# Patient Record
Sex: Female | Born: 1965 | Race: White | Hispanic: No | Smoking: Former smoker
Health system: Southern US, Community
[De-identification: ages and names within clinical notes are randomized; demographics above are authoritative.]

## PROBLEM LIST (undated history)

## (undated) DIAGNOSIS — R52 Pain, unspecified: ICD-10-CM

## (undated) DIAGNOSIS — Z789 Other specified health status: Secondary | ICD-10-CM

## (undated) DIAGNOSIS — F329 Major depressive disorder, single episode, unspecified: Secondary | ICD-10-CM

## (undated) DIAGNOSIS — F32A Depression, unspecified: Secondary | ICD-10-CM

## (undated) DIAGNOSIS — C50919 Malignant neoplasm of unspecified site of unspecified female breast: Secondary | ICD-10-CM

## (undated) DIAGNOSIS — D649 Anemia, unspecified: Secondary | ICD-10-CM

## (undated) HISTORY — DX: Major depressive disorder, single episode, unspecified: F32.9

## (undated) HISTORY — DX: Depression, unspecified: F32.A

---

## 1992-01-26 HISTORY — PX: TUBAL LIGATION: SHX77

## 1996-01-26 HISTORY — PX: ABDOMINOPLASTY: SUR9

## 1996-01-26 HISTORY — PX: BREAST REDUCTION SURGERY: SHX8

## 2011-01-26 HISTORY — PX: CYST EXCISION: SHX5701

## 2012-03-27 ENCOUNTER — Encounter: Payer: Self-pay | Admitting: Gynecology

## 2012-03-27 DIAGNOSIS — F329 Major depressive disorder, single episode, unspecified: Secondary | ICD-10-CM | POA: Insufficient documentation

## 2012-04-12 ENCOUNTER — Encounter (HOSPITAL_COMMUNITY): Payer: Self-pay | Admitting: Pharmacist

## 2012-04-14 ENCOUNTER — Institutional Professional Consult (permissible substitution): Payer: Self-pay | Admitting: Gynecology

## 2012-04-17 ENCOUNTER — Encounter (HOSPITAL_COMMUNITY): Payer: Self-pay

## 2012-04-17 ENCOUNTER — Encounter: Payer: Self-pay | Admitting: Gynecology

## 2012-04-17 ENCOUNTER — Ambulatory Visit (INDEPENDENT_AMBULATORY_CARE_PROVIDER_SITE_OTHER): Payer: Managed Care, Other (non HMO) | Admitting: Gynecology

## 2012-04-17 ENCOUNTER — Encounter (HOSPITAL_COMMUNITY)
Admission: RE | Admit: 2012-04-17 | Discharge: 2012-04-17 | Disposition: A | Discharge status: Home / Self Care | Payer: Managed Care, Other (non HMO) | Source: Ambulatory Visit | Attending: Gynecology | Admitting: Gynecology

## 2012-04-17 ENCOUNTER — Telehealth: Payer: Self-pay | Admitting: Gynecology

## 2012-04-17 VITALS — BP 102/60 | HR 64 | Resp 12

## 2012-04-17 DIAGNOSIS — N839 Noninflammatory disorder of ovary, fallopian tube and broad ligament, unspecified: Secondary | ICD-10-CM

## 2012-04-17 DIAGNOSIS — N838 Other noninflammatory disorders of ovary, fallopian tube and broad ligament: Secondary | ICD-10-CM

## 2012-04-17 HISTORY — DX: Other specified health status: Z78.9

## 2012-04-17 LAB — CBC
Hemoglobin: 13.7 g/dL (ref 12.0–15.0)
MCH: 31.3 pg (ref 26.0–34.0)
RBC: 4.38 MIL/uL (ref 3.87–5.11)
WBC: 5 10*3/uL (ref 4.0–10.5)

## 2012-04-17 LAB — SURGICAL PCR SCREEN
MRSA, PCR: NEGATIVE
Staphylococcus aureus: NEGATIVE

## 2012-04-17 MED ORDER — HYDROMORPHONE HCL 2 MG PO TABS: 2.0000 mg | ORAL_TABLET | ORAL | Status: DC | PRN

## 2012-04-17 NOTE — Patient Instructions (Addendum)
Pt reminded regarding upcoming surgery

## 2012-04-17 NOTE — Telephone Encounter (Signed)
PT WOULD LIKE SOMEONE IN BILLING TO CALL REGARDING HER SX PAYMENT.

## 2012-04-17 NOTE — Progress Notes (Signed)
Quick Note:  Pt informed at pre-op regarding labs ______

## 2012-04-17 NOTE — Progress Notes (Signed)
CHIEF COMPLAINT Left ovarian mass, suspect dermoid  HPI 46 yrsSingleCaucasian Gravity 3 Parity 2 female with a Patient's last menstrual period was 04/05/2012. presents with chief complaint of left ovarian mass with pelvic pain of 1year(s) duration.  Her symptoms are dysmenorrhea, but no dysparunia.Pt is aware that removal of the ovarian mass might not be adequate treatment for her pelvic pain/dysmenorrhea and is planning a wait and see, if need be, will place a Mirena IUD.  The patient's evaluation to date has included ultrasound which showed a posterior left ovary with a cystic mass with a solid calcification at the wall.  No free fluid.  Her medical therapy has included NSAIDS.  The patient is now requesting surgical evaluation and treatment for left dermoid.   PAST OB/GYN HISTORY Patient's last menstrual period was 04/05/2012. Menses yes; current menstrual pattern: flow is moderate and with severe dysmenorrhea Sexually active yes Method of contraception Sterilization for Men and Women History of sexually transmitted disease The patient denies history of sexually transmitted disease. Menopausal hormonal therapy no Prior gynecologic surgery yes c/s x2  Vaginal deliveries no  Cesarean sections yes 2 Miscarriages no  Voluntary interruption of pregnancy yes 1 Ectopic pregnancyno   Health Maintenance  Topic Date Due  . Influenza Vaccine  09/25/1964  . Pap Smear  09/10/1982  . Tetanus/tdap  09/10/1983    Past Medical History  Diagnosis Date  . Depression   . Medical history non-contributory     Personal history of reaction to anesthesia no, venous thromboembolism no, bleeding diathesis no, personal wish to avoid transfusion of blood products no  Past Surgical History  Procedure Laterality Date  . Abdominoplasty  1998  . Breast reduction surgery  1998  . Tubal ligation  1994  . Cesarean section      x 2  . Cyst excision  2013    from wrist    Allergies:  @RRALLERGIES @  Current Outpatient Prescriptions  Medication Sig Dispense Refill  . OVER THE COUNTER MEDICATION Take 2 tablets by mouth daily. Over the counter cleanse, pt states that it does have senna in it.       No current facility-administered medications for this visit.     reports that she has been smoking Cigarettes.  She has been smoking about 0.00 packs per day. She has never used smokeless tobacco. She reports that she does not drink alcohol or use illicit drugs.     Family History  Problem Relation Age of Onset  . Depression Mother     Family history of reaction to anesthesia  no, venous thromboembolism no, bleeding diathesis no.  ROS: Pertinent items are noted in HPI.   PHYSICAL EXAM  BP 102/60  Pulse 64  Resp 12  LMP 04/05/2012  General appearance: alert and cooperative Head: Normocephalic, without obvious abnormality, atraumatic Neck: no adenopathy, no carotid bruit, no JVD, supple, symmetrical, trachea midline and thyroid not enlarged, symmetric, no tenderness/mass/nodules Lungs: clear to auscultation bilaterally Heart: regular rate and rhythm, S1, S2 normal, no murmur, click, rub or gallop Abdomen: soft, non-tender; bowel sounds normal; no masses,  no organomegaly Neurologic: Grossly normal   Pelvic: External genitalia:  no lesions              Urethra: normal appearing urethra with no masses, tenderness or lesions              Bartholins and Skenes: normal  Vagina: normal appearing vagina with normal color and discharge, no lesions              Cervix: normal appearance        Bimanual Exam:  Uterus:  uterus is normal size, shape, consistency and tender in the fundal area                                      Adnexa:   Right non tender, left post to uterus by u/s not distinctly palpable by exam                                      Rectovaginal: Deferred                                      Anus:  defer exam  ASSESSMENT  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  The patient is scheduled for laparoscopic left salpinoophrectomy with right salpingectomy if possible procedure(s) at the [facility] [place] on May 01, 2012.  2.  The patient has undergone informed consent.     - Risks, benefits, alternatives to the procedure(s) including expectant management have been discussed with the patient.      - General risks of surgery include but are not limited to bleeding, infection, damage or perforation to surrounding organs, conversion to a larger incision during the procedure, additional necessary procedures identified intraoperatively, incisional hernia, deep venous thrombosis, pulmonary embolus, neuropathy, transfusion of blood products, reaction to anesthesia, pneumonia, death, and reoperation.   - Additional risks of the patient's specific procedure include but are not limited to risk of damage to underlying bowel, bladder or major vessels, which if major may require exploratory surgery at the time of procedure.  Delayed damage was discussed which may result in repeat surgery at a later date.  Risk of bleeding, infection was reviewed.  Risk of DVT formation as well as possible pulmonary embolism which could result in death.  Risk of bowel herniation which could result in necrosis and excision.  Signs and symptoms to be aware of were reviewed with the patient and she was informed to call if they should develop.  Pt is aware that port placement may interfere with her prior abdominoplasty and cesarean scars.   - Expectations and goals of surgery were discussed.    - Duration and requirements of surgical recovery were discussed.    - The patient has received written, verbal, and/or audio visual education regarding her procedure.    - Intended incisions have been described.     - The patient is able to verbalize understanding of the procedure and wishes to proceed.   Pt was given a prescription for  Dilaudid 2mg  for post-operative pain. Pt left without labs at last visit, works in [place], will have Ca125 done today, if normal, will proceed, if not pt is aware we will refer to gyn-onc  An after visit summary has been printed for the patient.

## 2012-04-17 NOTE — Patient Instructions (Signed)
Your procedure is scheduled on:05/01/12  Enter through the Main Entrance at :8am Pick up desk phone and dial [phone] and inform us of your arrival.  Please call [phone] if you have any problems the morning of surgery.  Remember: Do not eat or drink after midnight: SUNDAY   Take these meds the morning of surgery with a sip of water:none  DO NOT wear jewelry, eye make-up, lipstick,body lotion, or dark fingernail polish. Do not shave for 48 hours prior to surgery.  If you are to be admitted after surgery, leave suitcase in car until your room has been assigned. Patients discharged on the day of surgery will not be allowed to drive home.

## 2012-04-18 LAB — CA 125: CA 125: 10.1 U/mL (ref 0.0–30.2)

## 2012-04-19 NOTE — H&P (Addendum)
Marland Kitchen CHIEF COMPLAINT  Left ovarian mass, suspect dermoid  HPI  46 yrsSingleCaucasian Gravity 3 Parity 2 female with a Patient's last menstrual period was 04/05/2012. presents with chief complaint of left ovarian mass with pelvic pain of 1year(s) duration. Her symptoms are dysmenorrhea, but no dysparunia.Pt is aware that removal of the ovarian mass might not be adequate treatment for her pelvic pain/dysmenorrhea and is planning a wait and see, if need be, will place a Mirena IUD.  The patient's evaluation to date has included ultrasound which showed a posterior left ovary with a cystic mass with a solid calcification at the wall. No free fluid.  Her medical therapy has included NSAIDS.  The patient is now requesting surgical evaluation and treatment for left dermoid.   PAST OB/GYN HISTORY  Patient's last menstrual period was 04/05/2012.  Menses yes; current menstrual pattern: flow is moderate and with severe dysmenorrhea  Sexually active yes  Method of contraception Sterilization for Men and Women  History of sexually transmitted disease The patient denies history of sexually transmitted disease.  Menopausal hormonal therapy no  Prior gynecologic surgery yes c/s x2  Vaginal deliveries no  Cesarean sections yes 2  Miscarriages no  Voluntary interruption of pregnancy yes 1  Ectopic pregnancyno   Health Maintenance   Topic  Date Due   .  Influenza Vaccine  09/25/1964   .  Pap Smear  09/10/1982   .  Tetanus/tdap  09/10/1983    Past Medical History   Diagnosis  Date   .  Depression    .  Medical history non-contributory    Personal history of reaction to anesthesia no, venous thromboembolism no, bleeding diathesis no, personal wish to avoid transfusion of blood products no  Past Surgical History   Procedure  Laterality  Date   .  Abdominoplasty   1998   .  Breast reduction surgery   1998   .  Tubal ligation   1994   .  Cesarean section       x 2   .  Cyst excision   2013     from  wrist   Allergies: @RRALLERGIES @  Current Outpatient Prescriptions   Medication  Sig  Dispense  Refill   .  OVER THE COUNTER MEDICATION  Take 2 tablets by mouth daily. Over the counter cleanse, pt states that it does have senna in it.      No current facility-administered medications for this visit.   reports that she has been smoking Cigarettes. She has been smoking about 0.00 packs per day. She has never used smokeless tobacco. She reports that she does not drink alcohol or use illicit drugs.  Family History   Problem  Relation  Age of Onset   .  Depression  Mother    Family history of reaction to anesthesia no, venous thromboembolism no, bleeding diathesis no.  ROS: Pertinent items are noted in HPI.  PHYSICAL EXAM  BP 102/60  Pulse 64  Resp 12  LMP 04/05/2012  General appearance: alert and cooperative  Head: Normocephalic, without obvious abnormality, atraumatic  Neck: no adenopathy, no carotid bruit, no JVD, supple, symmetrical, trachea midline and thyroid not enlarged, symmetric, no tenderness/mass/nodules  Lungs: clear to auscultation bilaterally  Heart: regular rate and rhythm, S1, S2 normal, no murmur, click, rub or gallop  Abdomen: soft, non-tender; bowel sounds normal; no masses, no organomegaly  Neurologic: Grossly normal  Pelvic: External genitalia: no lesions  Urethra: normal appearing urethra with no masses, tenderness  or lesions  Bartholins and Skenes: normal  Vagina: normal appearing vagina with normal color and discharge, no lesions  Cervix: normal appearance  Bimanual Exam: Uterus: uterus is normal size, shape, consistency and tender in the fundal area  Adnexa: Right non tender, left post to uterus by u/s not distinctly palpable by exam  Rectovaginal: Deferred  Anus: defer exam  ASSESSMENT  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 PLAN  1. The patient is scheduled for laparoscopic left salpinoophrectomy with right salpingectomy if possible  procedure(s) at the [facility] of [place] on May 01, 2012.  2. The patient has undergone informed consent.  - Risks, benefits, alternatives to the procedure(s) including expectant management have been discussed with the patient.  - General risks of surgery include but are not limited to bleeding, infection, damage or perforation to surrounding organs, conversion to a larger incision during the procedure, additional necessary procedures identified intraoperatively, incisional hernia, deep venous thrombosis, pulmonary embolus, neuropathy, transfusion of blood products, reaction to anesthesia, pneumonia, death, and reoperation.  - Additional risks of the patient's specific procedure include but are not limited to risk of damage to underlying bowel, bladder or major vessels, which if major may require exploratory surgery at the time of procedure. Delayed damage was discussed which may result in repeat surgery at a later date. Risk of bleeding, infection was reviewed. Risk of DVT formation as well as possible pulmonary embolism which could result in death. Risk of bowel herniation which could result in necrosis and excision. Signs and symptoms to be aware of were reviewed with the patient and she was informed to call if they should develop. Pt is aware that port placement may interfere with her prior abdominoplasty and cesarean scars.  - Expectations and goals of surgery were discussed.  - Duration and requirements of surgical recovery were discussed.  - The patient has received written, verbal, and/or audio visual education regarding her procedure.  - Intended incisions have been described.  - The patient is able to verbalize understanding of the procedure and wishes to proceed.  Pt was given a prescription for Dilaudid 2mg  for post-operative pain.   No change

## 2012-05-01 ENCOUNTER — Encounter (HOSPITAL_COMMUNITY): Payer: Self-pay | Admitting: Anesthesiology

## 2012-05-01 ENCOUNTER — Ambulatory Visit (HOSPITAL_COMMUNITY)
Admission: RE | Admit: 2012-05-01 | Discharge: 2012-05-01 | Disposition: A | Discharge status: Home / Self Care | Payer: Managed Care, Other (non HMO) | Source: Ambulatory Visit | Attending: Gynecology | Admitting: Gynecology

## 2012-05-01 ENCOUNTER — Ambulatory Visit (HOSPITAL_COMMUNITY): Payer: Managed Care, Other (non HMO) | Admitting: Anesthesiology

## 2012-05-01 ENCOUNTER — Encounter (HOSPITAL_COMMUNITY)
Admission: RE | Disposition: A | Discharge status: Home / Self Care | Payer: Self-pay | Source: Ambulatory Visit | Attending: Gynecology

## 2012-05-01 DIAGNOSIS — D279 Benign neoplasm of unspecified ovary: Secondary | ICD-10-CM | POA: Insufficient documentation

## 2012-05-01 DIAGNOSIS — N949 Unspecified condition associated with female genital organs and menstrual cycle: Secondary | ICD-10-CM | POA: Insufficient documentation

## 2012-05-01 DIAGNOSIS — N83209 Unspecified ovarian cyst, unspecified side: Secondary | ICD-10-CM

## 2012-05-01 DIAGNOSIS — N838 Other noninflammatory disorders of ovary, fallopian tube and broad ligament: Secondary | ICD-10-CM | POA: Insufficient documentation

## 2012-05-01 DIAGNOSIS — N946 Dysmenorrhea, unspecified: Secondary | ICD-10-CM | POA: Insufficient documentation

## 2012-05-01 DIAGNOSIS — N831 Corpus luteum cyst of ovary, unspecified side: Secondary | ICD-10-CM | POA: Insufficient documentation

## 2012-05-01 HISTORY — PX: LAPAROSCOPY: SHX197

## 2012-05-01 HISTORY — PX: SALPINGOOPHORECTOMY: SHX82

## 2012-05-01 SURGERY — LAPAROSCOPY OPERATIVE
Anesthesia: General | Site: Abdomen | Wound class: Clean Contaminated

## 2012-05-01 MED ORDER — ROCURONIUM BROMIDE 50 MG/5ML IV SOLN
INTRAVENOUS | Status: AC
  Filled 2012-05-01: qty 1

## 2012-05-01 MED ORDER — HYDROMORPHONE HCL PF 1 MG/ML IJ SOLN: 0.2500 mg | INTRAMUSCULAR | Status: DC | PRN

## 2012-05-01 MED ORDER — PROPOFOL 10 MG/ML IV EMUL
INTRAVENOUS | Status: DC | PRN
  Administered 2012-05-01: 150 mg via INTRAVENOUS

## 2012-05-01 MED ORDER — BUPIVACAINE HCL (PF) 0.25 % IJ SOLN
INTRAMUSCULAR | Status: AC
  Filled 2012-05-01: qty 30

## 2012-05-01 MED ORDER — HYDROMORPHONE HCL PF 1 MG/ML IJ SOLN
INTRAMUSCULAR | Status: DC | PRN
  Administered 2012-05-01: 1 mg via INTRAVENOUS

## 2012-05-01 MED ORDER — FENTANYL CITRATE 0.05 MG/ML IJ SOLN
INTRAMUSCULAR | Status: DC | PRN
  Administered 2012-05-01: 100 ug via INTRAVENOUS
  Administered 2012-05-01: 75 ug via INTRAVENOUS
  Administered 2012-05-01: 25 ug via INTRAVENOUS
  Administered 2012-05-01: 50 ug via INTRAVENOUS

## 2012-05-01 MED ORDER — METOCLOPRAMIDE HCL 5 MG/ML IJ SOLN: 10.0000 mg | Freq: Once | INTRAMUSCULAR | Status: DC | PRN

## 2012-05-01 MED ORDER — FENTANYL CITRATE 0.05 MG/ML IJ SOLN
25.0000 ug | INTRAMUSCULAR | Status: DC | PRN
  Administered 2012-05-01: 50 ug via INTRAVENOUS

## 2012-05-01 MED ORDER — MIDAZOLAM HCL 2 MG/2ML IJ SOLN
INTRAMUSCULAR | Status: AC
  Filled 2012-05-01: qty 2

## 2012-05-01 MED ORDER — PHENYLEPHRINE HCL 10 MG/ML IJ SOLN
INTRAMUSCULAR | Status: DC | PRN
  Administered 2012-05-01: 80 ug via INTRAVENOUS

## 2012-05-01 MED ORDER — FENTANYL CITRATE 0.05 MG/ML IJ SOLN
INTRAMUSCULAR | Status: AC
  Administered 2012-05-01: 50 ug via INTRAVENOUS
  Filled 2012-05-01: qty 2

## 2012-05-01 MED ORDER — FENTANYL CITRATE 0.05 MG/ML IJ SOLN
INTRAMUSCULAR | Status: AC
  Filled 2012-05-01: qty 5

## 2012-05-01 MED ORDER — PHENYLEPHRINE 40 MCG/ML (10ML) SYRINGE FOR IV PUSH (FOR BLOOD PRESSURE SUPPORT)
PREFILLED_SYRINGE | INTRAVENOUS | Status: AC
  Filled 2012-05-01: qty 5

## 2012-05-01 MED ORDER — ONDANSETRON HCL 4 MG/2ML IJ SOLN
INTRAMUSCULAR | Status: AC
  Filled 2012-05-01: qty 2

## 2012-05-01 MED ORDER — HYDROMORPHONE HCL PF 1 MG/ML IJ SOLN
INTRAMUSCULAR | Status: AC
  Filled 2012-05-01: qty 1

## 2012-05-01 MED ORDER — DEXAMETHASONE SODIUM PHOSPHATE 10 MG/ML IJ SOLN
INTRAMUSCULAR | Status: AC
  Filled 2012-05-01: qty 1

## 2012-05-01 MED ORDER — MEPERIDINE HCL 25 MG/ML IJ SOLN: 6.2500 mg | INTRAMUSCULAR | Status: DC | PRN

## 2012-05-01 MED ORDER — LIDOCAINE HCL (CARDIAC) 20 MG/ML IV SOLN
INTRAVENOUS | Status: DC | PRN
  Administered 2012-05-01: 50 mg via INTRAVENOUS

## 2012-05-01 MED ORDER — NEOSTIGMINE METHYLSULFATE 1 MG/ML IJ SOLN
INTRAMUSCULAR | Status: DC | PRN
  Administered 2012-05-01: 3 mg via INTRAVENOUS

## 2012-05-01 MED ORDER — PROPOFOL 10 MG/ML IV EMUL
INTRAVENOUS | Status: AC
  Filled 2012-05-01: qty 20

## 2012-05-01 MED ORDER — ONDANSETRON HCL 4 MG/2ML IJ SOLN
INTRAMUSCULAR | Status: DC | PRN
  Administered 2012-05-01: 4 mg via INTRAVENOUS

## 2012-05-01 MED ORDER — GLYCOPYRROLATE 0.2 MG/ML IJ SOLN
INTRAMUSCULAR | Status: DC | PRN
  Administered 2012-05-01: .4 mg via INTRAVENOUS

## 2012-05-01 MED ORDER — LIDOCAINE HCL (CARDIAC) 20 MG/ML IV SOLN
INTRAVENOUS | Status: AC
  Filled 2012-05-01: qty 5

## 2012-05-01 MED ORDER — MIDAZOLAM HCL 5 MG/5ML IJ SOLN
INTRAMUSCULAR | Status: DC | PRN
  Administered 2012-05-01: 2 mg via INTRAVENOUS

## 2012-05-01 MED ORDER — LACTATED RINGERS IV SOLN
INTRAVENOUS | Status: DC
  Administered 2012-05-01 (×3): via INTRAVENOUS

## 2012-05-01 MED ORDER — BUPIVACAINE HCL (PF) 0.25 % IJ SOLN
INTRAMUSCULAR | Status: DC | PRN
  Administered 2012-05-01: 7 mL

## 2012-05-01 MED ORDER — ACETAMINOPHEN 10 MG/ML IV SOLN
INTRAVENOUS | Status: AC
  Administered 2012-05-01: 1000 mg via INTRAVENOUS
  Filled 2012-05-01: qty 100

## 2012-05-01 MED ORDER — DEXAMETHASONE SODIUM PHOSPHATE 10 MG/ML IJ SOLN
INTRAMUSCULAR | Status: DC | PRN
  Administered 2012-05-01: 10 mg via INTRAVENOUS

## 2012-05-01 MED ORDER — ROCURONIUM BROMIDE 100 MG/10ML IV SOLN
INTRAVENOUS | Status: DC | PRN
  Administered 2012-05-01: 5 mg via INTRAVENOUS
  Administered 2012-05-01: 10 mg via INTRAVENOUS
  Administered 2012-05-01: 30 mg via INTRAVENOUS

## 2012-05-01 SURGICAL SUPPLY — 27 items
BARRIER ADHS 3X4 INTERCEED (GAUZE/BANDAGES/DRESSINGS) IMPLANT
CABLE HIGH FREQUENCY MONO STRZ (ELECTRODE) IMPLANT
DERMABOND ADVANCED (GAUZE/BANDAGES/DRESSINGS) ×1
DERMABOND ADVANCED .7 DNX12 (GAUZE/BANDAGES/DRESSINGS) ×2 IMPLANT
FORCEPS CUTTING 33CM 5MM (CUTTING FORCEPS) ×3 IMPLANT
GLOVE BIOGEL M 6.5 STRL (GLOVE) ×3 IMPLANT
GLOVE BIOGEL PI IND STRL 6.5 (GLOVE) ×2 IMPLANT
GLOVE BIOGEL PI INDICATOR 6.5 (GLOVE) ×1
GOWN PREVENTION PLUS LG XLONG (DISPOSABLE) ×9 IMPLANT
NS IRRIG 1000ML POUR BTL (IV SOLUTION) ×3 IMPLANT
PACK LAPAROSCOPY BASIN (CUSTOM PROCEDURE TRAY) ×3 IMPLANT
POUCH SPECIMEN RETRIEVAL 10MM (ENDOMECHANICALS) ×3 IMPLANT
PROTECTOR NERVE ULNAR (MISCELLANEOUS) ×3 IMPLANT
SCALPEL HARMONIC ACE (MISCELLANEOUS) IMPLANT
SCISSORS LAP 5X35 DISP (ENDOMECHANICALS) IMPLANT
SET IRRIG TUBING LAPAROSCOPIC (IRRIGATION / IRRIGATOR) IMPLANT
STRIP CLOSURE SKIN 1/4X4 (GAUZE/BANDAGES/DRESSINGS) IMPLANT
SUT MNCRL AB 3-0 PS2 27 (SUTURE) ×3 IMPLANT
SUT VICRYL 0 ENDOLOOP (SUTURE) IMPLANT
SUT VICRYL 0 UR6 27IN ABS (SUTURE) ×3 IMPLANT
SYR 50ML LL SCALE MARK (SYRINGE) IMPLANT
TOWEL OR 17X24 6PK STRL BLUE (TOWEL DISPOSABLE) ×6 IMPLANT
TRAY FOLEY CATH 14FR (SET/KITS/TRAYS/PACK) ×3 IMPLANT
TROCAR XCEL NON-BLD 11X100MML (ENDOMECHANICALS) ×3 IMPLANT
TROCAR XCEL NON-BLD 5MMX100MML (ENDOMECHANICALS) ×6 IMPLANT
WARMER LAPAROSCOPE (MISCELLANEOUS) ×3 IMPLANT
WATER STERILE IRR 1000ML POUR (IV SOLUTION) ×3 IMPLANT

## 2012-05-01 NOTE — Transfer of Care (Signed)
Immediate Anesthesia Transfer of Care Note  Patient: Tamara Byrd  Procedure(s) Performed: Procedure(s): LAPAROSCOPY OPERATIVE with right  salpingectomy (N/A) SALPINGO OOPHORECTOMY  (Left)  Patient Location: PACU  Anesthesia Type:General  Level of Consciousness: awake  Airway & Oxygen Therapy: Patient Spontanous Breathing  Post-op Assessment: Report given to PACU RN  Post vital signs: stable  Filed Vitals:   05/01/12 0803  BP: 104/66  Pulse: 67  Temp: 36.9 C  Resp: 18    Complications: No apparent anesthesia complications

## 2012-05-01 NOTE — Anesthesia Postprocedure Evaluation (Signed)
  Anesthesia Post-op Note  Patient: Tamara Byrd  Procedure(s) Performed: Procedure(s): LAPAROSCOPY OPERATIVE with right  salpingectomy (N/A) SALPINGO OOPHORECTOMY  (Left)  Patient Location: PACU  Anesthesia Type:General  Level of Consciousness: awake, alert  and oriented  Airway and Oxygen Therapy: Patient Spontanous Breathing  Post-op Pain: none  Post-op Assessment: Post-op Vital signs reviewed, Patient's Cardiovascular Status Stable, Respiratory Function Stable, Patent Airway, No signs of Nausea or vomiting and Pain level controlled  Post-op Vital Signs: Reviewed and stable  Complications: No apparent anesthesia complications

## 2012-05-01 NOTE — Anesthesia Preprocedure Evaluation (Addendum)
Anesthesia Evaluation  Patient identified by MRN, date of birth, ID band Patient awake    Reviewed: Allergy & Precautions, H&P , NPO status , Patient's Chart, lab work & pertinent test results  Airway Mallampati: II      Dental no notable dental hx. (+) Teeth Intact   Pulmonary former smoker,    Pulmonary exam normal       Cardiovascular negative cardio ROS  Rhythm:Regular Rate:Normal     Neuro/Psych PSYCHIATRIC DISORDERS Depression negative neurological ROS     GI/Hepatic negative GI ROS, Neg liver ROS,   Endo/Other  Complex Ovarian Mass  Renal/GU negative Renal ROS  negative genitourinary   Musculoskeletal negative musculoskeletal ROS (+)   Abdominal   Peds  Hematology negative hematology ROS (+)   Anesthesia Other Findings   Reproductive/Obstetrics negative OB ROS                          Anesthesia Physical Anesthesia Plan  ASA: II  Anesthesia Plan: General   Post-op Pain Management:    Induction: Intravenous  Airway Management Planned: Oral ETT  Additional Equipment:   Intra-op Plan:   Post-operative Plan: Extubation in OR  Informed Consent: I have reviewed the patients History and Physical, chart, labs and discussed the procedure including the risks, benefits and alternatives for the proposed anesthesia with the patient or authorized representative who has indicated his/her understanding and acceptance.   Dental advisory given  Plan Discussed with: CRNA, Anesthesiologist and Surgeon  Anesthesia Plan Comments:         Anesthesia Quick Evaluation

## 2012-05-01 NOTE — Brief Op Note (Signed)
05/01/2012  11:57 AM  PATIENT:  Tamara Byrd  47 y.o. female  PRE-OPERATIVE DIAGNOSIS:  complex ovarian mass CPT [phone]  POST-OPERATIVE DIAGNOSIS:  complex ovarian mass  PROCEDURE:  Procedure(s): LAPAROSCOPY OPERATIVE with right  salpingectomy (N/A) SALPINGO OOPHORECTOMY  (Left)  SURGEON:  Surgeon(s) and Role:    * Bennye Alm, MD - Primary    * Alison Murray, MD - Assisting  PHYSICIAN ASSISTANT:   ASSISTANTS: none   ANESTHESIA:   general  EBL:  Total I/O In: 2100 [I.V.:2100] Out: 210 [Urine:200; Blood:10]  BLOOD ADMINISTERED:none  DRAINS: none   LOCAL MEDICATIONS USED:  MARCAINE    and Amount: 10 ml  SPECIMEN:  Source of Specimen:  left tube and ovary, right tube   DISPOSITION OF SPECIMEN:  PATHOLOGY  COUNTS:  YES  TOURNIQUET:  * No tourniquets in log *  DICTATION: .Other Dictation: Dictation Number [id number]  PLAN OF CARE: Discharge to home after PACU  PATIENT DISPOSITION:  PACU - hemodynamically stable.   Delay start of Pharmacological VTE agent (>24hrs) due to surgical blood loss or risk of bleeding: not applicable

## 2012-05-02 ENCOUNTER — Encounter (HOSPITAL_COMMUNITY): Payer: Self-pay | Admitting: Gynecology

## 2012-05-02 NOTE — Op Note (Signed)
[name], Tamara Byrd          ACCOUNT NO.:  [id number]  MEDICAL RECORD NO.:  [id number]  LOCATION:  [facility]                          FACILITY:  [facility]  PHYSICIAN:  Ivor Costa. Farrel Gobble, M.D. DATE OF BIRTH:  05/02/1965  DATE OF PROCEDURE:  05/01/2012 DATE OF DISCHARGE:  05/01/2012                              OPERATIVE REPORT   PREOPERATIVE DIAGNOSIS:  Left adnexal mass.  POSTOPERATIVE DIAGNOSIS:  Left adnexal mass.  PROCEDURE:  Laparoscopic left oophorectomy and right salpingectomy.  SURGEON:  Ivor Costa. Farrel Gobble, MD  ASSISTANT:  Romine.  ANESTHESIA:  GENERAL.  IV FLUIDS:  2100 mL of lactated Ringer's.  URINE OUTPUT:  200.  ESTIMATED BLOOD LOSS:  10.  FINDINGS:  Anteverted uterus, approximately 3 x 4 cm left adnexal mass. Tubes were notable for prior PTL.  Upper abdomen is unremarkable.  PATHOLOGY:  With left tube and ovary, portion of the right tube.  DESCRIPTION OF PROCEDURE:  The patient was taken to the operating room. General anesthesia was induced after being placed in dorsal lithotomy position.  Prepped and draped in usual sterile fashion.  Bivalve speculum was placed in the vagina.  The cervix was visualized, stabilized using a single tooth tenaculum and a Hulka clamp was then placed into the cervix.  Gloves were changed and the infraumbilical incision was made with the scalpel prior to which 4 mL of 0.25% Marcaine was injected into the port.  The pelvis was entered directly with the Optiview.  Confirmation of placement was confirmed.  The pelvis was inspected.  The findings were as noted above.  Two 5 mm ports were made laterally with careful attention to her abdominoplasty scar.  Using these 2 ports, the left tube and ovary was elevated.  The ureter was visualized.  Using the gyrus, the specimen was sharply dissected off the underlying IP ligament and was noted to be hemostatic afterwards.  The specimen was then placed in the anterior cul-de-sac.  The right tube  was then grasped, elevated, and sharply dissected off with the gyrus as well.  Similarly it was placed in the anterior cul-de-sac.  The 10 mm scope was switched for a 5, which was placed in one of the lower ports. The Endopouch was then placed through the infraumbilical port and the specimen was advanced through the umbilicus.  The specimen was grasped in the process of dissecting the specimen, so we could pass through the infraumbilical port.  The bag was broken.  A second Endopouch was then advanced through the umbilicus and the specimen was removed with extension of the infraumbilical port.  The specimen was then passed off the table.  A #10 scope was advanced back to the umbilicus.  The pneumoperitoneum was recreated.  Confirmation of all pedicles remained dry and then the 5 mm ports were then removed under direct visualization.  The pneumoperitoneum was released from the umbilical port.  The fascia and muscle was then plicated in the midline.  The skin was closed with 4-0 Monocryl.  In the umbilical port, 5 mm lower ports were reapproximated with Dermabond which were similarly placed in the infraumbilical port.  The patient tolerated the procedure well.  Sponge, lap, and needle counts were correct x2.  She was extubated in the OR and transferred to the PACU in stable condition.     Ivor Costa. Farrel Gobble, M.D.     THL/MEDQ  D:  05/01/2012  T:  05/02/2012  Job:  [id number]

## 2012-05-11 ENCOUNTER — Ambulatory Visit (INDEPENDENT_AMBULATORY_CARE_PROVIDER_SITE_OTHER): Payer: Managed Care, Other (non HMO) | Admitting: Gynecology

## 2012-05-11 VITALS — BP 124/58

## 2012-05-11 DIAGNOSIS — D279 Benign neoplasm of unspecified ovary: Secondary | ICD-10-CM | POA: Insufficient documentation

## 2012-05-11 DIAGNOSIS — D369 Benign neoplasm, unspecified site: Secondary | ICD-10-CM

## 2012-05-11 DIAGNOSIS — Z9889 Other specified postprocedural states: Secondary | ICD-10-CM

## 2012-05-11 NOTE — Progress Notes (Signed)
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  Pt is without complaints, no fever, chills.  She does report some discharge from her umbilical port but no pain  ROS per HPI. PE: Physical Examination: General appearance - alert, well appearing, and in no distress Abdomen - soft, nontender, nondistended, no masses or organomegaly scars from previous incisions healling well, min separation at right side of umbilical port with granulation tissue, nontender, no induration or warmth  Pelvic   VULVA: normal appearing vulva with no masses, tenderness or lesions,  VAGINA: normal appearing vagina with normal color and discharge, no lesions,  CERVIX: normal appearing cervix without discharge or lesions,  UTERUS: uterus is normal size, shape, consistency and nontender,  ADNEXA: no masses, nontender  Assessment:  Doing well post-op Plan:  Pt may return to work without restrictions Polysporin to umbilical port, recommend she stop using H2O2 which will break down the tissue Will rto for persistent dyspareunia or menorrhagia

## 2012-05-11 NOTE — Patient Instructions (Signed)
Follow up prn

## 2012-05-15 ENCOUNTER — Telehealth: Payer: Self-pay | Admitting: *Deleted

## 2012-05-15 NOTE — Telephone Encounter (Signed)
Left message with friend, Coraline Talwar, of need for patient to return our call. States he will give her the message.

## 2012-05-15 NOTE — Telephone Encounter (Signed)
Left message on CB# cell of need to return call to our office in regards to her follow up appt. With [facility] for her mammogram that was done 03/13/2012. Called phone # in chart of employer and left message for patient to return call to our office.  sue

## 2012-05-26 ENCOUNTER — Encounter: Payer: Self-pay | Admitting: Orthopedic Surgery

## 2012-05-26 ENCOUNTER — Encounter: Payer: Self-pay | Admitting: *Deleted

## 2012-05-26 ENCOUNTER — Telehealth: Payer: Self-pay | Admitting: Gynecology

## 2012-05-26 NOTE — Telephone Encounter (Signed)
Patient needs letter releasing her back to work as of 05/15/12 faxed [phone] attn: Rocco Pauls  As soon as possible please.

## 2012-06-09 ENCOUNTER — Telehealth: Payer: Self-pay | Admitting: *Deleted

## 2012-06-09 NOTE — Telephone Encounter (Signed)
CHART ON YOUR DESK TO REVIEW MAMMOGRAM FOR 04/17/2012

## 2012-06-13 ENCOUNTER — Telehealth: Payer: Self-pay | Admitting: *Deleted

## 2012-06-13 NOTE — Telephone Encounter (Signed)
[facility] REPORT OF 04/17/2012 . OUT OF HOLD PER DR. T. LATHROP.

## 2013-04-25 ENCOUNTER — Ambulatory Visit (INDEPENDENT_AMBULATORY_CARE_PROVIDER_SITE_OTHER): Payer: BC Managed Care – PPO | Admitting: Gynecology

## 2013-04-25 VITALS — BP 110/74 | Resp 20 | Ht 65.0 in | Wt 170.0 lb

## 2013-04-25 DIAGNOSIS — N943 Premenstrual tension syndrome: Secondary | ICD-10-CM

## 2013-04-25 DIAGNOSIS — R102 Pelvic and perineal pain: Secondary | ICD-10-CM

## 2013-04-25 DIAGNOSIS — N949 Unspecified condition associated with female genital organs and menstrual cycle: Secondary | ICD-10-CM

## 2013-04-25 MED ORDER — NORETHIN-ETH ESTRAD-FE BIPHAS 1 MG-10 MCG / 10 MCG PO TABS: 1.0000 | ORAL_TABLET | Freq: Every day | ORAL | Status: DC

## 2013-04-25 NOTE — Progress Notes (Signed)
Subjective:     Patient ID: Tamara Byrd, female   DOB: Aug 03, 1965, 48 y.o.   MRN: [id number]  HPI Comments: Pt here reporting onset of dysmenorrhea after surgery 4/14.  Did well until lst 3 cycles.  Pt rpeorts that her pain is pre-menstrual and has resolved with flow.  Flow is described unchanged.  Moodiness noted, short tempered, PMS, emotional. Pt reports weight gain, explained with travel and fatigue.  Pt restarted phentiramine and has lost some weight. Pt is also taking naprosen for knee pain.    Review of Systems     Objective:   Physical Exam  Nursing note and vitals reviewed. Constitutional: She is oriented to person, place, and time. She appears well-developed and well-nourished.  Neurological: She is alert and oriented to person, place, and time.   Pelvic: External genitalia:  no lesions              Urethra:  normal appearing urethra with no masses, tenderness or lesions              Bartholins and Skenes: normal                 Vagina: normal appearing vagina with normal color and discharge, no lesions              Cervix: normal appearance                Bimanual Exam:  Uterus:  uterus is normal size, shape, consistency and nontender                                      Adnexa: normal adnexa in size, nontender and no masses                                         Assessment:     PMS Pelvic pain  Weight gain    Plan:     Will start low dose ocp  And rto  50mto f/u symptoms and BP

## 2013-04-25 NOTE — Patient Instructions (Signed)
Start ocp first day of upcoming cycle

## 2013-11-26 ENCOUNTER — Encounter (HOSPITAL_COMMUNITY): Payer: Self-pay | Admitting: Gynecology

## 2013-12-12 ENCOUNTER — Telehealth: Payer: Self-pay | Admitting: Gynecology

## 2013-12-12 NOTE — Telephone Encounter (Signed)
Left message to call [place] at [phone].   Offer patient appointment 11/20 at 11am with Dr.Miller (time per Gay Filler). Patient needs to be aware Dr.Miller is coming from the OR and may be delayed.

## 2013-12-12 NOTE — Telephone Encounter (Signed)
Patient calling to report, "Right breast lump and under arm swelling with a lump." She has a mmg scheduled.

## 2013-12-12 NOTE — Telephone Encounter (Signed)
Spoke with patient at time of incoming call. Patient states that over the last week she began to have a lump in her right axillary area that has intermittent swelling and is red. States that a couple of days ago she noticed a lump on her right breast that is tender to the touch. States that right breast lump is warm to the touch. Denies fever or streaking of redness throughout the area. Denies fever. "It is getting increasingly more tender." Advised patient will need to be seen for evaluation. Patient is agreeable. Patient is out of town in [place] but will be back and available Friday. Advised patient will need to speak with providers and return call with best appointment options to schedule. Patient is agreeable and would like a voicemail left at number provided if she is unavailable to answer.

## 2013-12-13 NOTE — Telephone Encounter (Signed)
Spoke with patient. Appointment scheduled for tomorrow 11/20 at 11am with [facility] (time per Gay Filler). Patient is aware and agreeable that Dr.Miller is coming from the OR and may be delayed.  Routing to provider for final review. Patient agreeable to disposition. Will close encounter

## 2013-12-14 ENCOUNTER — Encounter: Payer: Self-pay | Admitting: Obstetrics & Gynecology

## 2013-12-14 ENCOUNTER — Ambulatory Visit (INDEPENDENT_AMBULATORY_CARE_PROVIDER_SITE_OTHER): Payer: BC Managed Care – PPO | Admitting: Obstetrics & Gynecology

## 2013-12-14 VITALS — BP 128/80 | HR 76 | Ht 65.0 in | Wt 174.0 lb

## 2013-12-14 DIAGNOSIS — N63 Unspecified lump in breast: Secondary | ICD-10-CM

## 2013-12-14 DIAGNOSIS — N6310 Unspecified lump in the right breast, unspecified quadrant: Secondary | ICD-10-CM

## 2013-12-14 DIAGNOSIS — R2231 Localized swelling, mass and lump, right upper limb: Secondary | ICD-10-CM

## 2013-12-23 NOTE — Progress Notes (Signed)
Subjective:     Patient ID: Tamara Byrd, female   DOB: 01/07/1966, 48 y.o.   MRN: [id number]  HPI 48 yo G3P2 MWF here with complaint of right axillary mass that pt noted over the past week.  Pt reports it has been red and tender with a small area in her right breast.  She noted some streaking in the breast as well but all of that is gone today.  Denies fever.  Denies pain.  Was traveling for work so today is first time she could be seen.  Review of Systems  All other systems reviewed and are negative.      Objective:   Physical Exam  Constitutional: She is oriented to person, place, and time. She appears well-developed and well-nourished.  Pulmonary/Chest: Right breast exhibits mass. Right breast exhibits no inverted nipple, no nipple discharge, no skin change and no tenderness. Left breast exhibits no inverted nipple, no mass, no nipple discharge, no skin change and no tenderness. Breasts are symmetrical.    Neurological: She is alert and oriented to person, place, and time.  Skin: Skin is warm and dry.  Psychiatric: She has a normal mood and affect.       Assessment:     Axillary mass and firm mass in RLQ, concerning for breast cancer      Plan:     Diagnostic MMG scheduled today.  Pt going straight over for Imaging to [facility].

## 2014-01-08 ENCOUNTER — Telehealth: Payer: Self-pay | Admitting: *Deleted

## 2014-01-08 NOTE — Telephone Encounter (Signed)
Call to [facility] to follow up on abnormal MMG and recommendation for biopsy that was scheduled for 12-27-13. Per Janett Billow, during precertification process it was discovered that patient does not have out of network benefits.Patient  lives in [place] and per insurance requirements needs to obtain care in [place]. Appointment at [facility] was canceled and patient was to contact insurance carrier for list of providers that would be in network. Janett Billow states they have not received request to transfer films to a new provider.

## 2014-01-08 NOTE — Telephone Encounter (Signed)
Routing to provider for final review. Any further follow-up?

## 2014-01-08 NOTE — Telephone Encounter (Signed)
Call to patient to check on status of provider. Patient states she needs records forwarded to [facility] [address] [place]. Phone [phone] fax [phone] Patient was seen yesterday and they are now working on scheduling imaging appointments. Advised I can send office note only as continuation of patient care. She will need to contact [facility] transfer of their records. Patient states she plans to return to our office for future care after changing insurance plans when able.

## 2014-01-08 NOTE — Telephone Encounter (Signed)
Faxed records to [facility] at [facility] at [phone]

## 2014-01-09 NOTE — Telephone Encounter (Signed)
Pt needs to be in MMG hold.  We have faxed records so encounter can be closed.  CC:  Tamara Byrd.

## 2014-01-09 NOTE — Telephone Encounter (Signed)
Continued Mammogram hold  

## 2016-07-10 ENCOUNTER — Encounter (HOSPITAL_COMMUNITY): Payer: Self-pay | Admitting: Emergency Medicine

## 2016-07-10 ENCOUNTER — Emergency Department (HOSPITAL_COMMUNITY)
Admission: EM | Admit: 2016-07-10 | Discharge: 2016-07-10 | Disposition: A | Discharge status: Home / Self Care | Payer: BLUE CROSS/BLUE SHIELD | Attending: Emergency Medicine | Admitting: Emergency Medicine

## 2016-07-10 DIAGNOSIS — R188 Other ascites: Secondary | ICD-10-CM | POA: Diagnosis not present

## 2016-07-10 DIAGNOSIS — R140 Abdominal distension (gaseous): Secondary | ICD-10-CM | POA: Diagnosis present

## 2016-07-10 DIAGNOSIS — Z79899 Other long term (current) drug therapy: Secondary | ICD-10-CM | POA: Insufficient documentation

## 2016-07-10 DIAGNOSIS — R0602 Shortness of breath: Secondary | ICD-10-CM | POA: Insufficient documentation

## 2016-07-10 HISTORY — DX: Malignant neoplasm of unspecified site of unspecified female breast: C50.919

## 2016-07-10 LAB — COMPREHENSIVE METABOLIC PANEL
ALT: 13 U/L — ABNORMAL LOW (ref 14–54)
AST: 19 U/L (ref 15–41)
Albumin: 3.5 g/dL (ref 3.5–5.0)
Alkaline Phosphatase: 38 U/L (ref 38–126)
Anion gap: 8 (ref 5–15)
BUN: 11 mg/dL (ref 6–20)
CO2: 22 mmol/L (ref 22–32)
Calcium: 8.7 mg/dL — ABNORMAL LOW (ref 8.9–10.3)
Chloride: 108 mmol/L (ref 101–111)
Creatinine, Ser: 0.64 mg/dL (ref 0.44–1.00)
GFR calc Af Amer: >60 mL/min (ref >60)
GFR calc non Af Amer: >60 mL/min (ref >60)
Glucose, Bld: 95 mg/dL (ref 65–99)
Potassium: 3.7 mmol/L (ref 3.5–5.1)
Sodium: 138 mmol/L (ref 135–145)
Total Bilirubin: 0.4 mg/dL (ref 0.3–1.2)
Total Protein: 5.9 g/dL — ABNORMAL LOW (ref 6.5–8.1)

## 2016-07-10 LAB — URINALYSIS, ROUTINE W REFLEX MICROSCOPIC
Bilirubin Urine: NEGATIVE
Glucose, UA: NEGATIVE mg/dL
Hgb urine dipstick: NEGATIVE
Ketones, ur: 80 mg/dL — AB
Leukocytes, UA: NEGATIVE
Nitrite: NEGATIVE
Protein, ur: NEGATIVE mg/dL
Specific Gravity, Urine: 1.033 — ABNORMAL HIGH (ref 1.005–1.030)
pH: 5 (ref 5.0–8.0)

## 2016-07-10 LAB — CBC
HCT: 41 % (ref 36.0–46.0)
Hemoglobin: 13.1 g/dL (ref 12.0–15.0)
MCH: 30.5 pg (ref 26.0–34.0)
MCHC: 32 g/dL (ref 30.0–36.0)
MCV: 95.3 fL (ref 78.0–100.0)
Platelets: 461 K/uL — ABNORMAL HIGH (ref 150–400)
RBC: 4.3 MIL/uL (ref 3.87–5.11)
RDW: 13 % (ref 11.5–15.5)
WBC: 3.8 K/uL — ABNORMAL LOW (ref 4.0–10.5)

## 2016-07-10 LAB — LIPASE, BLOOD: Lipase: 30 U/L (ref 11–51)

## 2016-07-10 NOTE — Discharge Instructions (Signed)
Please see oncologist as soon as possible for further evaluation and treatment of your ascites. They may want to test the fluid prior to drainage, if they feel that is necessary. Please return to the emergency department if you develop any new or worsening symptoms such as fever over 100.4, significant worsening shortness of breath, or any other new or concerning symptoms.

## 2016-07-10 NOTE — ED Triage Notes (Signed)
Pt states "ive had acities for a week and a half, ive been taking tamoxifen but the acities doesn't seem to be going away.". Hx of breast cancer.

## 2016-07-10 NOTE — ED Provider Notes (Signed)
[facility] DEPT Provider Note   CSN: [id number] Arrival date & time: 07/10/16  1011     History   Chief Complaint Chief Complaint  Patient presents with  . Bloated    HPI Tamara Byrd is a 51 y.o. female with history of breast cancer on tamoxifen who presents with a ten-day history of abdominal fluid retention. Patient reports she has been on tamoxifen for a year and half and went off of it for about a month because she was out of her prescription and restarted 2 weeks ago. Patient began having fluid retention shortly after that. 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 Patient denies any pain, but the pressure is uncomfortable. She occasionally has mild trouble breathing, but nothing significant. She denies any fevers, chest pain, abdominal pain, nausea, vomiting. Patient does note that she has been urinating a little less because she has been trying to reduce her fluid intake to help her problem. She is currently trying to find another oncologist at the [facility].  HPI  Past Medical History:  Diagnosis Date  . Breast cancer ([place])     There are no active problems to display for this patient.   No past surgical history on file.  OB History    No data available       Home Medications    Prior to Admission medications   Medication Sig Start Date End Date Taking? Authorizing Provider  amphetamine-dextroamphetamine (ADDERALL) 20 MG tablet Take 10 mg by mouth daily.   Yes [provider]  buPROPion (WELLBUTRIN XL) 150 MG 24 hr tablet Take 150 mg by mouth daily.   Yes [provider]  docusate sodium (COLACE) 100 MG capsule Take 100 mg by mouth daily.   Yes [provider]  phentermine 37.5 MG capsule Take 0.5 mg by mouth daily.   Yes [provider]  tamoxifen (NOLVADEX) 20 MG tablet Take 20 mg by mouth daily.   Yes [provider]  buPROPion (WELLBUTRIN  SR) 150 MG 12 hr tablet Take 150 mg by mouth 2 (two) times daily.    [provider]    Family History No family history on file.  Social History Social History  Substance Use Topics  . Smoking status: Not on file  . Smokeless tobacco: Not on file  . Alcohol use Not on file     Allergies   Patient has no known allergies.   Review of Systems Review of Systems  Constitutional: Negative for chills and fever.  HENT: Negative for facial swelling and sore throat.   Respiratory: Positive for shortness of breath (mild, occasionally when her abdominal distention is severe).   Cardiovascular: Negative for chest pain.  Gastrointestinal: Positive for abdominal distention. Negative for abdominal pain, nausea and vomiting.  Genitourinary: Negative for dysuria.  Musculoskeletal: Negative for back pain.  Skin: Negative for rash and wound.  Neurological: Negative for headaches.  Psychiatric/Behavioral: The patient is not nervous/anxious.      Physical Exam Updated Vital Signs BP 111/74   Pulse 78   Temp 97.3 F (36.3 C) (Oral)   Resp 17   SpO2 98%   Physical Exam  Constitutional: She appears well-developed and well-nourished. No distress.  HENT:  Head: Normocephalic and atraumatic.  Mouth/Throat: Oropharynx is clear and moist. No oropharyngeal exudate.  Eyes: Conjunctivae are normal. Pupils are equal, round, and reactive to light. Right eye exhibits no discharge. Left eye  exhibits no discharge. No scleral icterus.  Neck: Normal range of motion. Neck supple. No thyromegaly present.  Cardiovascular: Normal rate, regular rhythm, normal heart sounds and intact distal pulses.  Exam reveals no gallop and no friction rub.   No murmur heard. Pulmonary/Chest: Effort normal and breath sounds normal. No stridor. No respiratory distress. She has no wheezes. She has no rales.  Abdominal: Soft. Bowel sounds are normal. She exhibits distension and ascites. There is no tenderness. There  is no rebound and no guarding.  Musculoskeletal: She exhibits no edema.  Lymphadenopathy:    She has no cervical adenopathy.  Neurological: She is alert. Coordination normal.  Skin: Skin is warm and dry. No rash noted. She is not diaphoretic. No pallor.  Psychiatric: She has a normal mood and affect.  Nursing note and vitals reviewed.    ED Treatments / Results  Labs (all labs ordered are listed, but only abnormal results are displayed) Labs Reviewed  COMPREHENSIVE METABOLIC PANEL - Abnormal; Notable for the following:       Result Value   Calcium 8.7 (*)    Total Protein 5.9 (*)    ALT 13 (*)    All other components within normal limits  CBC - Abnormal; Notable for the following:    WBC 3.8 (*)    Platelets 461 (*)    All other components within normal limits  URINALYSIS, ROUTINE W REFLEX MICROSCOPIC - Abnormal; Notable for the following:    APPearance HAZY (*)    Specific Gravity, Urine 1.033 (*)    Ketones, ur 80 (*)    All other components within normal limits  LIPASE, BLOOD    EKG  EKG Interpretation None       Radiology No results found.  Procedures Procedures (including critical care time)  Medications Ordered in ED Medications - No data to display   Initial Impression / Assessment and Plan / ED Course  I have reviewed the triage vital signs and the nursing notes.  Pertinent labs & imaging results that were available during my care of the patient were reviewed by me and considered in my medical decision making (see chart for details).     Patient with ascites for about 1.5 weeks. CBC shows to be DC 3.8, platelet 461K. LFTs are within normal limits. Renal function within normal limits. UA shows 80 ketones, most likely due to dehydration. Patient ascites potentially due to resuming tamoxifen, however referred patient to oncology for further evaluation and treatment and to rule out any malignant cause. Patient does not have any indications for emergent  paracentesis today. Patient denies any significant shortness of breath or pain. Return precautions discussed. Patient advised to follow-up with the cancer center this is possible. I have sent the oncologist on-call a message regarding the patient and hopes to have her follow-up sooner. (She has been attempting to establish care via the new patient coordinator.) Patient understands and agrees with plan. Patient was stable throughout ED course and discharged in satisfactory condition. I discussed patient case with Dr. Jeanell Sparrow who guided the patient's management and agrees with plan.   Final Clinical Impressions(s) / ED Diagnoses   Final diagnoses:  Other ascites    New Prescriptions Discharge Medication List as of 07/10/2016  1:34 PM       Frederica Kuster, PA-C 07/10/16 1735    Pattricia Boss, MD 07/11/16 1450

## 2016-07-11 ENCOUNTER — Other Ambulatory Visit: Payer: Self-pay | Admitting: Oncology

## 2016-07-11 DIAGNOSIS — C50512 Malignant neoplasm of lower-outer quadrant of left female breast: Secondary | ICD-10-CM

## 2016-07-11 DIAGNOSIS — C50919 Malignant neoplasm of unspecified site of unspecified female breast: Secondary | ICD-10-CM | POA: Insufficient documentation

## 2016-07-11 DIAGNOSIS — Z170 Estrogen receptor positive status [ER+]: Principal | ICD-10-CM

## 2016-07-12 ENCOUNTER — Telehealth: Payer: Self-pay | Admitting: Oncology

## 2016-07-12 ENCOUNTER — Encounter: Payer: Self-pay | Admitting: Obstetrics & Gynecology

## 2016-07-12 NOTE — Telephone Encounter (Signed)
lvm to inform pt of 6/19 and 6/22 appts per staff email

## 2016-07-13 ENCOUNTER — Other Ambulatory Visit: Payer: BLUE CROSS/BLUE SHIELD

## 2016-07-14 ENCOUNTER — Telehealth: Payer: Self-pay | Admitting: Oncology

## 2016-07-14 NOTE — Telephone Encounter (Signed)
Patient called today and rescheduled her appointment from 6/22 to 09/13/2016.   She states she cannot come 07/16/16 and due to her work/travel schedule she can only come at certain times and then only on a Monday as she travels back and forth to [place] for work.   Patient given new appointment for 09/13/2016 for both lab/GM. Message sent to navigator, GM and FS re patient rescheduling.

## 2016-07-15 ENCOUNTER — Encounter: Admit: 2016-07-15 | Discharge: 2016-07-16

## 2016-07-16 ENCOUNTER — Ambulatory Visit: Payer: BLUE CROSS/BLUE SHIELD | Admitting: Oncology

## 2016-07-26 ENCOUNTER — Encounter: Admit: 2016-07-26 | Discharge: 2016-07-27

## 2016-07-30 ENCOUNTER — Other Ambulatory Visit: Payer: Self-pay | Admitting: Orthopedic Surgery

## 2016-07-30 ENCOUNTER — Ambulatory Visit
Admission: RE | Admit: 2016-07-30 | Discharge: 2016-07-30 | Disposition: A | Discharge status: Home / Self Care | Payer: BLUE CROSS/BLUE SHIELD | Source: Ambulatory Visit | Attending: Orthopedic Surgery | Admitting: Orthopedic Surgery

## 2016-07-30 DIAGNOSIS — M25552 Pain in left hip: Secondary | ICD-10-CM

## 2016-08-05 ENCOUNTER — Encounter: Admit: 2016-08-05 | Discharge: 2016-08-06

## 2016-08-06 ENCOUNTER — Encounter: Admit: 2016-08-06 | Discharge: 2016-08-07

## 2016-08-06 ENCOUNTER — Encounter: Admit: 2016-08-06 | Discharge: 2016-08-06

## 2016-08-23 ENCOUNTER — Encounter: Payer: Self-pay | Admitting: Hematology and Oncology

## 2016-08-23 ENCOUNTER — Telehealth: Payer: Self-pay | Admitting: Hematology and Oncology

## 2016-08-23 NOTE — Telephone Encounter (Signed)
Appt has been scheduled for the pt to see Dr. Lindi Adie on 8/3 at 12pm. Pt agreed to the appt date and time. Pt aware to arrive 15 minutes early. Insurance verified.

## 2016-08-27 ENCOUNTER — Other Ambulatory Visit: Payer: Self-pay

## 2016-08-27 ENCOUNTER — Ambulatory Visit (HOSPITAL_BASED_OUTPATIENT_CLINIC_OR_DEPARTMENT_OTHER): Payer: BLUE CROSS/BLUE SHIELD | Admitting: Hematology and Oncology

## 2016-08-27 ENCOUNTER — Encounter: Payer: Self-pay | Admitting: Hematology and Oncology

## 2016-08-27 ENCOUNTER — Other Ambulatory Visit: Payer: Self-pay | Admitting: Hematology and Oncology

## 2016-08-27 ENCOUNTER — Ambulatory Visit (HOSPITAL_BASED_OUTPATIENT_CLINIC_OR_DEPARTMENT_OTHER): Payer: BLUE CROSS/BLUE SHIELD

## 2016-08-27 VITALS — BP 94/62 | HR 104 | Temp 98.2°F | Resp 17 | Ht 65.0 in | Wt 150.9 lb

## 2016-08-27 DIAGNOSIS — K769 Liver disease, unspecified: Secondary | ICD-10-CM | POA: Diagnosis not present

## 2016-08-27 DIAGNOSIS — R188 Other ascites: Secondary | ICD-10-CM

## 2016-08-27 DIAGNOSIS — G893 Neoplasm related pain (acute) (chronic): Secondary | ICD-10-CM

## 2016-08-27 DIAGNOSIS — C7951 Secondary malignant neoplasm of bone: Secondary | ICD-10-CM | POA: Diagnosis not present

## 2016-08-27 DIAGNOSIS — C50919 Malignant neoplasm of unspecified site of unspecified female breast: Secondary | ICD-10-CM | POA: Diagnosis not present

## 2016-08-27 DIAGNOSIS — J90 Pleural effusion, not elsewhere classified: Secondary | ICD-10-CM

## 2016-08-27 DIAGNOSIS — C7900 Secondary malignant neoplasm of unspecified kidney and renal pelvis: Secondary | ICD-10-CM | POA: Diagnosis not present

## 2016-08-27 DIAGNOSIS — C50512 Malignant neoplasm of lower-outer quadrant of left female breast: Secondary | ICD-10-CM

## 2016-08-27 DIAGNOSIS — E279 Disorder of adrenal gland, unspecified: Secondary | ICD-10-CM

## 2016-08-27 DIAGNOSIS — Z170 Estrogen receptor positive status [ER+]: Secondary | ICD-10-CM

## 2016-08-27 LAB — COMPREHENSIVE METABOLIC PANEL
ALBUMIN: 2.2 g/dL — AB (ref 3.5–5.0)
ALK PHOS: 59 U/L (ref 40–150)
ALT: 8 U/L (ref 0–55)
AST: 16 U/L (ref 5–34)
Anion Gap: 9 mEq/L (ref 3–11)
BUN: 8.4 mg/dL (ref 7.0–26.0)
CO2: 25 mEq/L (ref 22–29)
CREATININE: 0.7 mg/dL (ref 0.6–1.1)
Calcium: 8.1 mg/dL — ABNORMAL LOW (ref 8.4–10.4)
Chloride: 104 mEq/L (ref 98–109)
EGFR: >90 mL/min/{1.73_m2} (ref >90)
Glucose: 84 mg/dl (ref 70–140)
Potassium: 4 mEq/L (ref 3.5–5.1)
Sodium: 138 mEq/L (ref 136–145)
Total Bilirubin: <0.22 mg/dL (ref 0.20–1.20)
Total Protein: 5.9 g/dL — ABNORMAL LOW (ref 6.4–8.3)

## 2016-08-27 LAB — CBC WITH DIFFERENTIAL/PLATELET
BASO%: 0.2 % (ref 0.0–2.0)
Basophils Absolute: 0 10*3/uL (ref 0.0–0.1)
EOS%: 1.2 % (ref 0.0–7.0)
Eosinophils Absolute: 0.1 10*3/uL (ref 0.0–0.5)
HEMATOCRIT: 40.2 % (ref 34.8–46.6)
HEMOGLOBIN: 13 g/dL (ref 11.6–15.9)
LYMPH#: 0.3 10*3/uL — AB (ref 0.9–3.3)
LYMPH%: 6.6 % — ABNORMAL LOW (ref 14.0–49.7)
MCH: 30.9 pg (ref 25.1–34.0)
MCHC: 32.3 g/dL (ref 31.5–36.0)
MCV: 95.5 fL (ref 79.5–101.0)
MONO#: 0.2 10*3/uL (ref 0.1–0.9)
MONO%: 5.7 % (ref 0.0–14.0)
NEUT%: 86.3 % — ABNORMAL HIGH (ref 38.4–76.8)
NEUTROS ABS: 3.6 10*3/uL (ref 1.5–6.5)
Platelets: 385 10*3/uL (ref 145–400)
RBC: 4.21 10*6/uL (ref 3.70–5.45)
RDW: 12.9 % (ref 11.2–14.5)
WBC: 4.2 10*3/uL (ref 3.9–10.3)

## 2016-08-27 MED ORDER — SUCRALFATE 1 GM/10ML PO SUSP: 1.0000 g | Freq: Three times a day (TID) | ORAL | 0 refills | Status: DC

## 2016-08-27 MED ORDER — CALCIUM 1200 1200-1000 MG-UNIT PO CHEW: 1.0000 | CHEWABLE_TABLET | Freq: Every day | ORAL | Status: DC

## 2016-08-27 MED ORDER — ONDANSETRON HCL 8 MG PO TABS: 8.0000 mg | ORAL_TABLET | Freq: Two times a day (BID) | ORAL | 0 refills | Status: DC

## 2016-08-27 MED ORDER — FENTANYL 50 MCG/HR TD PT72: 50.0000 ug | MEDICATED_PATCH | TRANSDERMAL | 0 refills | Status: DC

## 2016-08-27 MED ORDER — PALBOCICLIB 125 MG PO CAPS: 125.0000 mg | ORAL_CAPSULE | Freq: Every day | ORAL | 0 refills | Status: DC

## 2016-08-27 MED ORDER — DIPHENHYDRAMINE HCL 50 MG PO CAPS: 50.0000 mg | ORAL_CAPSULE | Freq: Every evening | ORAL | 0 refills | Status: DC | PRN

## 2016-08-27 MED ORDER — PANTOPRAZOLE SODIUM 40 MG PO PACK: 40.0000 mg | PACK | Freq: Every day | ORAL | Status: DC

## 2016-08-27 NOTE — Assessment & Plan Note (Signed)
Metastatic breast cancer with bone, lymph nodes, liver, pleural effusion, ascites, pelvic masses and intrarenal metastases diagnosed 07/15/2016 (Prior history of stage IIIc breast cancer December 2015 treated with mastectomy, adjuvant chemotherapy, radiation and tamoxifen)  Current treatment: Palbociclib with Faslodex and Xgeva Patient was counseled extensively by Dr.Ummed from [place] [facility] regarding the risks and benefits of Palbociclib. She appears to be tolerating it fairly well.  Plan: 1. Recheck labs in 2 weeks 2. established the patient to receive Faslodex injections once a month 3. Bone metastases: Xgeva once a month along with calcium and vitamin D.  Pain from bone metastases: Currently on fentanyl patch 50 g every 72 hours along with over-the-counter NSAIDs.  Prognosis: I discussed the patient that stage IV breast cancer cannot be cured. Goal is palliation. Patient lives with her mother who is her primary caregiver. She has a son and a daughter who are not living in this area.

## 2016-08-27 NOTE — Progress Notes (Signed)
[facility] CONSULT NOTE  Patient Care Team: Olena Mater, MD as PCP - General (Internal Medicine) Elveria Rising, MD (Obstetrics and Gynecology)  CHIEF COMPLAINTS/PURPOSE OF CONSULTATION:  Metastatic breast cancer  HISTORY OF PRESENTING ILLNESS:  Tamara Byrd 51 y.o. female is here because of metastatic breast cancer. Patient originally had right breast cancer treated with [facility] with mastectomy and axillary lymph node dissection where 1216 lymph nodes were positive followed by adjuvant chemotherapy with dose dense Adriamycin Cytoxan followed by Taxotere 12 followed by adjuvant radiation and tamoxifen. In June 2018 patient presented with abdominal distention and CT scans revealed metastatic disease. She then moved to [place] because she works as a traveling Restaurant manager, fast food. While she was there as a [employer] officer, she noticed worsening abdominal distention and was admitted to the hospital. Workup with CT scan showed metastatic disease. She was seen by oncology Dr.Ummed who performed PET/CT scan. She had suspicion for impending fracture of the femur as well as a T8 compression fracture. She underwent palliative radiation therapy to the back as well as bilateral hips. She received Faslodex injections while she was in the hospital and was started on Palbociclib last Monday. She moved back into the with her mother and is here today to discuss continuation of the current treatment plan with Faslodex and Palbociclib along with Xgeva.  I reviewed her records extensively and collaborated the history with the patient.  SUMMARY OF ONCOLOGIC HISTORY:   Metastatic breast cancer ([place])   01/21/2014 Initial Diagnosis    Right breast cancer status post mastectomy and axillary lymph node dissection multifocal disease 4.4 cm, 1.1 cm, 1 mm, ER 93%, PR 40-50%, HER-2 negative ratio 1.3, T2 N3 a stage IIIc diagnosed at [facility] by Dr.Katragadda; treated with adjuvant  chemotherapy with before meals 4 followed by Taxotere 12 followed by radiation and tamoxifen      07/15/2016 Relapse/Recurrence    Patient presented with abdominal distention and ascites; CT 07/15/2016 revealed ascites, pleural effusion, pericardial masses, 8 mm right liver nodule, adrenal nodule; biopsy metastatic breast cancer ER/PR positive HER-2 negative; MRI pelvis 07/15/2016 pelvic mass is 5.27 minutes, 5.6 cm, right adrenal nodule 4.2 cm      07/23/2016 Miscellaneous    Paracentesis 1.7 L cytology negative, peritoneal catheter, patient claims that once or twice a week      07/26/2016 PET scan    Pelvic masses, intrarenal mass, portal caval lymph node, anterior mediastinal lymph node, several bone lesions T8 compression fracture and right femur concern for impending fracture, right pleural effusion, ascites      08/08/2016 -  Anti-estrogen oral therapy    Faslodex (loading doses given on 08/08/2016 and 08/23/2016) Palbociclib (started 08/23/2016) and Xgeva (last injection 08/12/2016)      08/10/2016 - 08/24/2016 Radiation Therapy    Radiation therapy to T8 and bilateral femurs (complication: Esophagitis): Radiation done in [place] ([facility] [facility])      MEDICAL HISTORY:  Past Medical History:  Diagnosis Date  . Breast cancer ([place])   . Depression   . Medical history non-contributory     SURGICAL HISTORY: Past Surgical History:  Procedure Laterality Date  . ABDOMINOPLASTY  1998  . BREAST REDUCTION SURGERY  1998  . CESAREAN SECTION     x 2  . CYST EXCISION  2013   from wrist  . LAPAROSCOPY N/A 05/01/2012   Procedure: LAPAROSCOPY OPERATIVE with right  salpingectomy;  Surgeon: Azalia Bilis, MD;  Location: [facility];  Service: Gynecology;  Laterality: N/A;  .  SALPINGOOPHORECTOMY Left 05/01/2012   Procedure: SALPINGO OOPHORECTOMY ;  Surgeon: Azalia Bilis, MD;  Location: [facility];  Service: Gynecology;  Laterality: Left;  . TUBAL LIGATION  1994    SOCIAL  HISTORY: Social History   Social History  . Marital status: Legally Separated    Spouse name: N/A  . Number of children: N/A  . Years of education: N/A   Occupational History  . Not on file.   Social History Main Topics  . Smoking status: Not on file  . Smokeless tobacco: Not on file  . Alcohol use No  . Drug use: No  . Sexual activity: Yes    Partners: Male    Birth control/ protection: Other-see comments     Comment: BTL   Other Topics Concern  . Not on file   Social History Narrative   ** Merged History Encounter **        FAMILY HISTORY: Family History  Problem Relation Age of Onset  . Depression Mother     ALLERGIES:  has No Known Allergies.  MEDICATIONS:  Current Outpatient Prescriptions  Medication Sig Dispense Refill  . amphetamine-dextroamphetamine (ADDERALL) 20 MG tablet Take 10 mg by mouth daily.    Marland Kitchen buPROPion (WELLBUTRIN SR) 150 MG 12 hr tablet Take 150 mg by mouth 2 (two) times daily.    Marland Kitchen buPROPion (WELLBUTRIN XL) 150 MG 24 hr tablet Take 150 mg by mouth daily.    . Calcium Carbonate-Vit D-Min (CALCIUM 1200) 1200-1000 MG-UNIT CHEW Chew 1 tablet by mouth daily. 30 each   . diphenhydrAMINE (BENADRYL) 50 MG capsule Take 1 capsule (50 mg total) by mouth at bedtime as needed. 30 capsule 0  . docusate sodium (COLACE) 100 MG capsule Take 100 mg by mouth daily.    . fentaNYL (DURAGESIC - DOSED MCG/HR) 50 MCG/HR Place 1 patch (50 mcg total) onto the skin every 3 (three) days. 10 patch 0  . ibuprofen (ADVIL,MOTRIN) 800 MG tablet Take 800 mg by mouth every 8 (eight) hours as needed for pain.    Marland Kitchen ondansetron (ZOFRAN) 8 MG tablet Take 1 tablet (8 mg total) by mouth 2 (two) times daily. 20 tablet 0  . OVER THE COUNTER MEDICATION Take 2 tablets by mouth daily. Over the counter cleanse, pt states that it does have senna in it.    . palbociclib (IBRANCE) 125 MG capsule Take 1 capsule (125 mg total) by mouth daily with breakfast. Take whole with food. 21 capsule 0  .  pantoprazole sodium (PROTONIX) 40 mg/20 mL PACK Place 20 mLs (40 mg total) into feeding tube daily. 30 each   . phentermine 37.5 MG capsule Take 0.5 mg by mouth daily.    . sucralfate (CARAFATE) 1 GM/10ML suspension Take 10 mLs (1 g total) by mouth 4 (four) times daily -  with meals and at bedtime. 420 mL 0   No current facility-administered medications for this visit.     REVIEW OF SYSTEMS:   Constitutional: Denies fevers, chills or abnormal night sweats Eyes: Denies blurriness of vision, double vision or watery eyes Ears, nose, mouth, throat, and face: Denies mucositis or sore throat Respiratory: Denies cough, dyspnea or wheezes Cardiovascular: Denies palpitation, chest discomfort or lower extremity swelling Gastrointestinal:  Denies nausea, heartburn or change in bowel habits Skin: Denies abnormal skin rashes Lymphatics: Denies new lymphadenopathy or easy bruising Neurological:Denies numbness, tingling or new weaknesses, Back pain issues Behavioral/Psych: Mood is stable, no new changes   All other systems were reviewed with  the patient and are negative.  PHYSICAL EXAMINATION: ECOG PERFORMANCE STATUS: 2 - Symptomatic, <50% confined to bed  Vitals:   08/27/16 1221  BP: 94/62  Pulse: (!) 104  Resp: 17  Temp: 98.2 F (36.8 C)   Filed Weights   08/27/16 1221  Weight: 150 lb 14.4 oz (68.4 kg)    GENERAL:alert, no distress and comfortable, Uses a wheelchair for ambulation. SKIN: skin color, texture, turgor are normal, no rashes or significant lesions EYES: normal, conjunctiva are pink and non-injected, sclera clear OROPHARYNX:no exudate, no erythema and lips, buccal mucosa, and tongue normal  NECK: supple, thyroid normal size, non-tender, without nodularity LYMPH:  no palpable lymphadenopathy in the cervical, axillary or inguinal LUNGS: clear to auscultation and percussion with normal breathing effort HEART: regular rate & rhythm and no murmurs and no lower extremity  edema ABDOMEN:abdomen soft, non-tender and normal bowel sounds Musculoskeletal:no cyanosis of digits and no clubbing  PSYCH: alert & oriented x 3 with fluent speech NEURO: no focal motor/sensory deficits   LABORATORY DATA:  I have reviewed the data as listed Lab Results  Component Value Date   WBC 4.2 08/27/2016   HGB 13.0 08/27/2016   HCT 40.2 08/27/2016   MCV 95.5 08/27/2016   PLT 385 08/27/2016   Lab Results  Component Value Date   NA 138 07/10/2016   K 3.7 07/10/2016   CL 108 07/10/2016   CO2 22 07/10/2016    RADIOGRAPHIC STUDIES: I have personally reviewed the radiological reports and agreed with the findings in the report.  ASSESSMENT AND PLAN:  Metastatic breast cancer ([place]) Metastatic breast cancer with bone, lymph nodes, liver, pleural effusion, ascites, pelvic masses and intrarenal metastases diagnosed 07/15/2016 (Prior history of stage IIIc breast cancer December 2015 treated with mastectomy, adjuvant chemotherapy, radiation and tamoxifen)  Current treatment: Palbociclib with Faslodex and Xgeva Patient was counseled extensively by Dr.Ummed from [place] [facility] regarding the risks and benefits of Palbociclib. She appears to be tolerating it fairly well.  Plan: 1. Recheck labs in 2 weeks 2. established the patient to receive Faslodex injections once a month 3. Bone metastases: Xgeva once a month along with calcium and vitamin D.  Pain from bone metastases: Currently on fentanyl patch 50 g every 72 hours along with over-the-counter NSAIDs.  Prognosis: I discussed the patient that stage IV breast cancer cannot be cured. Goal is palliation. Patient lives with her mother who is her primary caregiver. She has a son and a daughter who are not living in this area.    All questions were answered. The patient knows to call the clinic with any problems, questions or concerns.    Rulon Eisenmenger, MD 08/27/16

## 2016-08-30 ENCOUNTER — Encounter: Payer: Self-pay | Admitting: Hematology and Oncology

## 2016-08-30 ENCOUNTER — Emergency Department (HOSPITAL_COMMUNITY): Payer: BLUE CROSS/BLUE SHIELD

## 2016-08-30 ENCOUNTER — Emergency Department (HOSPITAL_COMMUNITY)
Admission: EM | Admit: 2016-08-30 | Discharge: 2016-08-30 | Disposition: A | Discharge status: Home / Self Care | Payer: BLUE CROSS/BLUE SHIELD | Attending: Emergency Medicine | Admitting: Emergency Medicine

## 2016-08-30 ENCOUNTER — Encounter (HOSPITAL_COMMUNITY): Payer: Self-pay | Admitting: Family Medicine

## 2016-08-30 ENCOUNTER — Other Ambulatory Visit: Payer: Self-pay | Admitting: Emergency Medicine

## 2016-08-30 ENCOUNTER — Other Ambulatory Visit: Payer: Self-pay

## 2016-08-30 ENCOUNTER — Telehealth: Payer: Self-pay

## 2016-08-30 ENCOUNTER — Encounter: Admit: 2016-08-30 | Discharge: 2016-08-31

## 2016-08-30 DIAGNOSIS — R69 Illness, unspecified: Principal | ICD-10-CM

## 2016-08-30 DIAGNOSIS — C787 Secondary malignant neoplasm of liver and intrahepatic bile duct: Secondary | ICD-10-CM | POA: Insufficient documentation

## 2016-08-30 DIAGNOSIS — S22059A Unspecified fracture of T5-T6 vertebra, initial encounter for closed fracture: Secondary | ICD-10-CM | POA: Insufficient documentation

## 2016-08-30 DIAGNOSIS — X58XXXA Exposure to other specified factors, initial encounter: Secondary | ICD-10-CM | POA: Insufficient documentation

## 2016-08-30 DIAGNOSIS — Y92099 Unspecified place in other non-institutional residence as the place of occurrence of the external cause: Secondary | ICD-10-CM | POA: Diagnosis not present

## 2016-08-30 DIAGNOSIS — C50919 Malignant neoplasm of unspecified site of unspecified female breast: Secondary | ICD-10-CM | POA: Diagnosis not present

## 2016-08-30 DIAGNOSIS — C7951 Secondary malignant neoplasm of bone: Secondary | ICD-10-CM | POA: Diagnosis not present

## 2016-08-30 DIAGNOSIS — M546 Pain in thoracic spine: Secondary | ICD-10-CM | POA: Diagnosis present

## 2016-08-30 DIAGNOSIS — Y999 Unspecified external cause status: Secondary | ICD-10-CM | POA: Diagnosis not present

## 2016-08-30 DIAGNOSIS — Y939 Activity, unspecified: Secondary | ICD-10-CM | POA: Diagnosis not present

## 2016-08-30 LAB — CBC WITH DIFFERENTIAL/PLATELET
BASOS PCT: 0 %
Basophils Absolute: 0 10*3/uL (ref 0.0–0.1)
EOS PCT: 0 %
Eosinophils Absolute: 0 10*3/uL (ref 0.0–0.7)
HEMATOCRIT: 33.1 % — AB (ref 36.0–46.0)
Hemoglobin: 11.2 g/dL — ABNORMAL LOW (ref 12.0–15.0)
Lymphocytes Relative: 3 %
Lymphs Abs: 0.1 10*3/uL — ABNORMAL LOW (ref 0.7–4.0)
MCH: 30.6 pg (ref 26.0–34.0)
MCHC: 33.8 g/dL (ref 30.0–36.0)
MCV: 90.4 fL (ref 78.0–100.0)
MONO ABS: 0.1 10*3/uL (ref 0.1–1.0)
MONOS PCT: 3 %
NEUTROS ABS: 4.7 10*3/uL (ref 1.7–7.7)
Neutrophils Relative %: 94 %
Platelets: 351 10*3/uL (ref 150–400)
RBC: 3.66 MIL/uL — ABNORMAL LOW (ref 3.87–5.11)
RDW: 13 % (ref 11.5–15.5)
WBC: 5 10*3/uL (ref 4.0–10.5)

## 2016-08-30 LAB — BASIC METABOLIC PANEL
Anion gap: 9 (ref 5–15)
BUN: 10 mg/dL (ref 6–20)
CALCIUM: 6.6 mg/dL — AB (ref 8.9–10.3)
CO2: 22 mmol/L (ref 22–32)
CREATININE: 0.73 mg/dL (ref 0.44–1.00)
Chloride: 105 mmol/L (ref 101–111)
GFR calc non Af Amer: >60 mL/min (ref >60)
Glucose, Bld: 102 mg/dL — ABNORMAL HIGH (ref 65–99)
Potassium: 3.5 mmol/L (ref 3.5–5.1)
Sodium: 136 mmol/L (ref 135–145)

## 2016-08-30 MED ORDER — IOPAMIDOL (ISOVUE-370) INJECTION 76%
INTRAVENOUS | Status: AC
  Filled 2016-08-30: qty 100

## 2016-08-30 MED ORDER — IOPAMIDOL (ISOVUE-370) INJECTION 76%
100.0000 mL | Freq: Once | INTRAVENOUS | Status: AC | PRN
  Administered 2016-08-30: 100 mL via INTRAVENOUS

## 2016-08-30 NOTE — ED Provider Notes (Signed)
[facility] DEPT Provider Note   CSN: [id number] Arrival date & time: 08/30/16  1807     History   Chief Complaint Chief Complaint  Patient presents with  . Rib Fracture    HPI Tamara Byrd is a 51 y.o. 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 The patient states that around 4pm today she performed a twisting motion and heard a "popping and cracking" sounds in her mid back. She is now complaining of pain in her mid to lower back that wraps around her ribcage. The patient says that whenever she bends or twists she can feel the pain worsen. Initially the pain was 10/10. She has been given 2 oxycodone tablets, fentanyl patch and 40mcg fentanyl that has brought the pain to 7/10. Denies upper back pain or neck pain, numbness/tingling/weakness of the lower extremities, urinary retention, loss of bowel/bladder function, or saddle anesthesia.   Upon further questioning the patient states that the pain increases when taking a deep breath and wraps around her rib cage to the base of there lungs. She notes she is always short of breath with a non-productive cough after having known pleural effusion. She notes increased immobilization at home since being on chemo. No exogenous estrogen use, recent surgery or travel, smoking, previous blood clot, hemoptysis, lower extremity pain or swelling, or family history of bleeding/clotting disorder.   HPI  Past Medical History:  Diagnosis Date  . Breast cancer ([place])   . Depression   . Medical history non-contributory     Patient Active Problem List   Diagnosis Date Noted  . Bone metastases ([place]) 08/27/2016  . Metastatic breast cancer ([place]) 07/11/2016  . 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 Depression     Past Surgical History:  Procedure Laterality Date    . ABDOMINOPLASTY  1998  . BREAST REDUCTION SURGERY  1998  . CESAREAN SECTION     x 2  . CYST EXCISION  2013   from wrist  . LAPAROSCOPY N/A 05/01/2012   Procedure: LAPAROSCOPY OPERATIVE with right  salpingectomy;  Surgeon: Azalia Bilis, MD;  Location: [facility];  Service: Gynecology;  Laterality: N/A;  . SALPINGOOPHORECTOMY Left 05/01/2012   Procedure: SALPINGO OOPHORECTOMY ;  Surgeon: Azalia Bilis, MD;  Location: [facility];  Service: Gynecology;  Laterality: Left;  . TUBAL LIGATION  1994    OB History    Gravida Para Term Preterm AB Living   3 2 2  0 1 2   SAB TAB Ectopic Multiple Live Births   0 0 0   2       Home Medications    Prior to Admission medications   Medication Sig Start Date End Date Taking? Authorizing Provider  amphetamine-dextroamphetamine (ADDERALL) 20 MG tablet Take 10 mg by mouth daily.    [provider]  buPROPion (WELLBUTRIN SR) 150 MG 12 hr tablet Take 150 mg by mouth 2 (two) times daily.    [provider]  buPROPion (WELLBUTRIN XL) 150 MG 24 hr tablet Take 150 mg by mouth daily.    [provider]  Calcium Carbonate-Vit D-Min (CALCIUM 1200) 1200-1000 MG-UNIT CHEW Chew 1 tablet by mouth daily. 08/27/16   Nicholas Lose, MD  diphenhydrAMINE (BENADRYL) 50 MG capsule Take 1 capsule (50 mg total) by mouth at bedtime as needed. 08/27/16   Nicholas Lose, MD  docusate sodium (COLACE) 100 MG capsule Take 100 mg by mouth daily.    [provider]  fentaNYL (DURAGESIC - DOSED MCG/HR) 50 MCG/HR Place 1 patch (50 mcg total) onto the skin every 3 (three) days. 08/27/16   Nicholas Lose, MD  ibuprofen (ADVIL,MOTRIN) 800 MG tablet Take 800 mg by mouth every 8 (eight) hours as needed for pain.    [provider]  ondansetron (ZOFRAN) 8 MG tablet Take 1 tablet (8 mg total) by mouth 2 (two) times daily. 08/27/16   Nicholas Lose, MD  OVER THE COUNTER MEDICATION Take 2 tablets by mouth daily. Over the counter cleanse, pt states that it does have  senna in it.    [provider]  palbociclib Leslee Home) 125 MG capsule Take 1 capsule (125 mg total) by mouth daily with breakfast. Take whole with food. 08/27/16   Nicholas Lose, MD  pantoprazole sodium (PROTONIX) 40 mg/20 mL PACK Place 20 mLs (40 mg total) into feeding tube daily. 08/27/16   Nicholas Lose, MD  phentermine 37.5 MG capsule Take 0.5 mg by mouth daily.    [provider]  sucralfate (CARAFATE) 1 GM/10ML suspension Take 10 mLs (1 g total) by mouth 4 (four) times daily -  with meals and at bedtime. 08/27/16   Nicholas Lose, MD    Family History Family History  Problem Relation Age of Onset  . Depression Mother     Social History Social History  Substance Use Topics  . Smoking status: Former Research scientist (life sciences)  . Smokeless tobacco: Never Used  . Alcohol use No     Allergies   Patient has no known allergies.   Review of Systems Review of Systems  All other systems reviewed and are negative.    Physical Exam Updated Vital Signs BP 101/73 (BP Location: Left Arm)   Pulse (!) 105   Temp 98.4 F (36.9 C) (Oral)   Resp 18   Ht 5\' 5"  (1.651 m)   Wt 68 kg (150 lb)   LMP 11/23/2013 Comment: last period was very light, spotting the week before  SpO2 93%   BMI 24.96 kg/m   Physical Exam  Constitutional: She is oriented to person, place, and time. She appears well-developed and well-nourished. No distress.  Non-toxic appearing  HENT:  Head: Normocephalic and atraumatic.  Right Ear: External ear normal.  Left Ear: External ear normal.  Nose: Nose normal.  Mouth/Throat: Oropharynx is clear and moist.  Eyes: Pupils are equal, round, and reactive to light. Right eye exhibits no discharge. Left eye exhibits no discharge. No scleral icterus.  Neck: Normal range of motion. Neck supple.  Cardiovascular: Normal rate, regular rhythm and intact distal pulses.   No murmur heard. Pulses:      Radial pulses are 2+ on the right side, and 2+ on the left side.       Dorsalis  pedis pulses are 2+ on the right side, and 2+ on the left side.       Posterior tibial pulses are 2+ on the right side, and 2+ on the left side.  No lower extremity swelling or edema. Calves symmetric in size bilaterally.  Pulmonary/Chest: Effort normal and breath sounds normal. She exhibits no tenderness.  Abdominal: Soft. Bowel sounds are normal. She exhibits ascites. There is no tenderness. There is no rebound, no guarding and no CVA tenderness.  Musculoskeletal: Normal range of motion. She exhibits tenderness. She exhibits no edema.  No C spinal tenderness. T and L spine tenderness to  palpation. No obvious signs of trauma, deformity, infection, step-offs. Lung expansion normal. No scoliosis or kyphosis. Bilateral lower extremity strength 5 out of 5, sensation grossly intact. Ambulates appropriately.    Lymphadenopathy:    She has no cervical adenopathy.  Neurological: She is alert and oriented to person, place, and time. She has normal strength. A sensory deficit is present. Gait abnormal.  Skin: Skin is warm and dry. No rash noted. She is not diaphoretic.  Psychiatric: She has a normal mood and affect. Her behavior is normal. Judgment and thought content normal.  Nursing note and vitals reviewed.    ED Treatments / Results  Labs (all labs ordered are listed, but only abnormal results are displayed) Labs Reviewed  CBC WITH DIFFERENTIAL/PLATELET - Abnormal; Notable for the following:       Result Value   RBC 3.66 (*)    Hemoglobin 11.2 (*)    HCT 33.1 (*)    Lymphs Abs 0.1 (*)    All other components within normal limits  BASIC METABOLIC PANEL - Abnormal; Notable for the following:    Glucose, Bld 102 (*)    Calcium 6.6 (*)    All other components within normal limits    EKG  EKG Interpretation None       Radiology Ct Angio Chest Pe W/cm &/or Wo Cm  Result Date: 08/30/2016 CLINICAL DATA:  Thoracic and lumbar pain with dyspnea. History of breast cancer with possible  metastasis. EXAM: CT ANGIOGRAPHY CHEST WITH CONTRAST TECHNIQUE: Multidetector CT imaging of the chest was performed using the standard protocol during bolus administration of intravenous contrast. Multiplanar CT image reconstructions and MIPs were obtained to evaluate the vascular anatomy. CONTRAST:  100 cc Isovue 370 IV COMPARISON:  None. FINDINGS: Cardiovascular: Normal heart size without pericardial effusion. No aortic aneurysm or dissection. No acute pulmonary embolus. Mediastinum/Nodes: 1 cm cystic nodule of the left thyroid gland with smaller 0.4 cm cystic nodule adjacent to it. No thyromegaly. Normal branch pattern of the great vessels. No mediastinal, axillary adenopathy. Right axillary lymph node dissection clips are noted. No hilar lymphadenopathy in is seen. Lungs/Pleura: Moderate right sided pleural effusion with adjacent compressive atelectasis. Ground-glass appearance of the remainder of the lungs likely reflect stigmata of hypoventilatory change or possibly pulmonary edema, alveolitis or pneumonitis among some possibilities though not exclusive. No dominant mass is seen. Upper Abdomen: Moderate volume of ascites noted in the upper included abdomen with omental thickening in the left upper quadrant and mesenteric edema. No space-occupying mass of the included liver. Status post right mastectomy with intact appearing right saline breast implant. Musculoskeletal: Mixed osteolytic and sclerotic appearance of the T5 and T8 vertebral bodies with more subtle lytic appearance of the T4 and T9 vertebral bodies concerning for osseous metastatic disease. Review of the MIP images confirms the above findings. IMPRESSION: 1. No acute pulmonary embolus. 2. Moderate right effusion with compressive atelectasis. 3. Moderate amount of ascites seen of the included abdomen. Mesenteric edema and induration is noted with mild omental thickening. 4. Osseous metastatic disease of the thoracic spine involving T4, T5, T8 and T9  Electronically Signed   By: Ashley Royalty M.D.   On: 08/30/2016 21:12   Ct Thoracic Spine Wo Contrast  Result Date: 08/30/2016 CLINICAL DATA:  Mid back pain, felt a pop midthoracic spine at site of prior fracture. History of metastatic breast cancer. Suspect pathologic fracture. EXAM: CT THORACIC AND LUMBAR SPINE WITHOUT CONTRAST TECHNIQUE: Multidetector CT imaging of the thoracic and lumbar spine was  performed without contrast. Multiplanar CT image reconstructions were also generated. COMPARISON:  None. FINDINGS: CT THORACIC SPINE FINDINGS ALIGNMENT: Maintained thoracic lordosis. No malalignment. VERTEBRAE: Multiple lytic lesions within the vertebral bodies compatible with metastasis (T3 through T5, T8 and T9, T12). Expansile lytic lesion T4 spinous process with nondisplaced pathologic fracture. Moderate T8 pathologic fracture with approximately 50% height loss is likely acute. Mild T5 superior endplate pathologic fracture with central less than 25% superior endplate height loss, chronic appearing. No retropulsed bony fragments. Intervertebral disc heights generally preserved with multilevel mild ventral endplate spurring. PARASPINAL AND OTHER SOFT TISSUES: Paraspinal soft tissues are normal. Please see CT of chest from same day, reported separately for dedicated findings. 10 mm LEFT thyroid nodule, below size followup recommendation. DISC LEVELS: No osseous canal stenosis or neural foraminal narrowing at any level. CT LUMBAR SPINE FINDINGS SEGMENTATION: For the purposes of this report the last well-formed intervertebral disc space is reported as L5-S1. ALIGNMENT: Maintained lumbar lordosis. No malalignment. VERTEBRAE: Lytic lesions in the sacrum. Subcentimeter lytic lesion L1, L2 L5. Vertebral bodies and posterior elements intact. Intervertebral disc heights preserved. Severe sacroiliac osteoarthrosis. PARASPINAL AND OTHER SOFT TISSUES: 3.6 RIGHT and 2.1 cm LEFT adrenal masses. 4 mm nonobstructing LEFT interpolar  nephrolithiasis. Contrast in the colon. Ascites. DISC LEVELS: L1-2 through L4-5: No disc bulge, canal stenosis nor neural foraminal narrowing. L5-S1: Small broad-based disc bulge and endplate spurring. No canal stenosis. Moderate RIGHT neural foraminal narrowing. IMPRESSION: CT THORACIC SPINE IMPRESSION 1. Multiple lytic lesions consistent with osseous metastasis. Mild T5 and moderate T8 pathologic fractures without retropulsion. 2. No canal stenosis or neural foraminal narrowing. CT LUMBAR SPINE IMPRESSION 1. Multiple lytic lesions consistent with osseous metastasis. No pathologic fracture. 2. Included abdomen demonstrates bilateral adrenal masses concerning for metastasis. Ascites. Recommend dedicated imaging. Electronically Signed   By: Elon Alas M.D.   On: 08/30/2016 21:15   Ct Lumbar Spine Wo Contrast  Result Date: 08/30/2016 CLINICAL DATA:  Mid back pain, felt a pop midthoracic spine at site of prior fracture. History of metastatic breast cancer. Suspect pathologic fracture. EXAM: CT THORACIC AND LUMBAR SPINE WITHOUT CONTRAST TECHNIQUE: Multidetector CT imaging of the thoracic and lumbar spine was performed without contrast. Multiplanar CT image reconstructions were also generated. COMPARISON:  None. FINDINGS: CT THORACIC SPINE FINDINGS ALIGNMENT: Maintained thoracic lordosis. No malalignment. VERTEBRAE: Multiple lytic lesions within the vertebral bodies compatible with metastasis (T3 through T5, T8 and T9, T12). Expansile lytic lesion T4 spinous process with nondisplaced pathologic fracture. Moderate T8 pathologic fracture with approximately 50% height loss is likely acute. Mild T5 superior endplate pathologic fracture with central less than 25% superior endplate height loss, chronic appearing. No retropulsed bony fragments. Intervertebral disc heights generally preserved with multilevel mild ventral endplate spurring. PARASPINAL AND OTHER SOFT TISSUES: Paraspinal soft tissues are normal. Please  see CT of chest from same day, reported separately for dedicated findings. 10 mm LEFT thyroid nodule, below size followup recommendation. DISC LEVELS: No osseous canal stenosis or neural foraminal narrowing at any level. CT LUMBAR SPINE FINDINGS SEGMENTATION: For the purposes of this report the last well-formed intervertebral disc space is reported as L5-S1. ALIGNMENT: Maintained lumbar lordosis. No malalignment. VERTEBRAE: Lytic lesions in the sacrum. Subcentimeter lytic lesion L1, L2 L5. Vertebral bodies and posterior elements intact. Intervertebral disc heights preserved. Severe sacroiliac osteoarthrosis. PARASPINAL AND OTHER SOFT TISSUES: 3.6 RIGHT and 2.1 cm LEFT adrenal masses. 4 mm nonobstructing LEFT interpolar nephrolithiasis. Contrast in the colon. Ascites. DISC LEVELS: L1-2 through L4-5: No disc  bulge, canal stenosis nor neural foraminal narrowing. L5-S1: Small broad-based disc bulge and endplate spurring. No canal stenosis. Moderate RIGHT neural foraminal narrowing. IMPRESSION: CT THORACIC SPINE IMPRESSION 1. Multiple lytic lesions consistent with osseous metastasis. Mild T5 and moderate T8 pathologic fractures without retropulsion. 2. No canal stenosis or neural foraminal narrowing. CT LUMBAR SPINE IMPRESSION 1. Multiple lytic lesions consistent with osseous metastasis. No pathologic fracture. 2. Included abdomen demonstrates bilateral adrenal masses concerning for metastasis. Ascites. Recommend dedicated imaging. Electronically Signed   By: Elon Alas M.D.   On: 08/30/2016 21:15    Procedures Procedures (including critical care time)  Medications Ordered in ED Medications  iopamidol (ISOVUE-370) 76 % injection (not administered)  iopamidol (ISOVUE-370) 76 % injection 100 mL (100 mLs Intravenous Contrast Given 08/30/16 2056)     Initial Impression / Assessment and Plan / ED Course  I have reviewed the triage vital signs and the nursing notes.  Pertinent labs & imaging results that  were available during my care of the patient were reviewed by me and considered in my medical decision making (see chart for details).     51 year old female with history of stage IV metastatic cancer with history of  metastatic breast cancer with bone, lymph node, liver, pleural effusion, ascites, pelvic masses and intrarenal metastases and known T8 pathologic compression fracture. Presents for rib and back pain. Concern for pathologic fracture. Also concern for PE due to history of cancer, tachycardia, cough, sob, pleuritic pain and S1Q3T3 on EKG. SpO2 93. Pain controlled prior to arrival.   Basic labs reassuring. CT without PE. Pleural effusion, ascites, adrenal masses as known. T4, T5, T8, and T9 with osseous metastatic disease. Known T8 pathologic fracture. New T5 fracture. No retropulsion.  No lumbar spine fracture. Multiple lytic lesions c/w osseous metastasis. Goal is pain control. Patient states she has pain regimen at home and would not like anything more at this time.   The evaluation does not show pathology that would require ongoing emergent intervention or inpatient treatment. I advised the patient to follow-up with Oncology in the next 1-2 days. I advised the patient to return to the emergency department with new or worsening symptoms or new concerns. Specific return precautions discussed. The patient verbalized understanding and agreement with plan. All questions answered. No further questions at this time. The patient is hemodynamically stable, mentating appropriately and appears safe for discharge.  Final Clinical Impressions(s) / ED Diagnoses   Final diagnoses:  Metastatic breast cancer ([place])  Closed fracture of fifth thoracic vertebra, unspecified fracture morphology, initial encounter [facility])    New Prescriptions New Prescriptions   No medications on file     Lorelle Gibbs 08/30/16 2213    Little, Wenda Overland, MD 08/31/16 [phone]

## 2016-08-30 NOTE — ED Triage Notes (Signed)
Patient is from home and transported via [facility]. Patient performed a twisting motion, head a "pop" sound around the T-8, T-9 region, where she has previously had fractures. Patient is complaining of thorac pain and left flank pain. Patient took OXYCODONE 2 tablets before arrival of EMS. EMS administered FENTANYL 62mcg for pain. Patient has a history of breast cancer that has spread.

## 2016-08-30 NOTE — Progress Notes (Signed)
Received PA request for Carafate 1gm/10 ML suspension.  Submitted via Cover My Meds.  Pending decision.

## 2016-08-30 NOTE — ED Notes (Signed)
Notified CT that patient is ready for scan.  

## 2016-08-30 NOTE — ED Notes (Signed)
Bed: WA12 Expected date:  Expected time:  Means of arrival:  Comments: ems 

## 2016-08-30 NOTE — Telephone Encounter (Signed)
Called pt back to return vm regarding pt possible broken vertebrae. Pt is in excruciating pain. Advised pt to go call 911 now and have her transported via ambulance due to her sharp pain. Pt lives on the 2nd floor. Pt had a hard time talking due to pain in her back. Pt verbalized understanding and will call 911 now.

## 2016-08-30 NOTE — Discharge Instructions (Signed)
Your xrays show that you have metastatic disease to your spine as we discussed. There is a mild fracture to T5. We will control your pain at home as we have discussed. Please follow up with oncology tomorrow. If you develop worsening or new concerning symptoms you can return to the emergency department for re-evaluation.

## 2016-08-31 ENCOUNTER — Telehealth: Payer: Self-pay

## 2016-08-31 ENCOUNTER — Ambulatory Visit (HOSPITAL_BASED_OUTPATIENT_CLINIC_OR_DEPARTMENT_OTHER): Payer: BLUE CROSS/BLUE SHIELD | Admitting: Hematology and Oncology

## 2016-08-31 ENCOUNTER — Telehealth: Payer: Self-pay | Admitting: Pharmacist

## 2016-08-31 ENCOUNTER — Encounter: Payer: Self-pay | Admitting: Hematology and Oncology

## 2016-08-31 VITALS — BP 103/72 | HR 106 | Temp 98.2°F | Resp 18 | Ht 65.0 in | Wt 148.3 lb

## 2016-08-31 DIAGNOSIS — G893 Neoplasm related pain (acute) (chronic): Secondary | ICD-10-CM | POA: Diagnosis not present

## 2016-08-31 DIAGNOSIS — J90 Pleural effusion, not elsewhere classified: Secondary | ICD-10-CM

## 2016-08-31 DIAGNOSIS — C7900 Secondary malignant neoplasm of unspecified kidney and renal pelvis: Secondary | ICD-10-CM | POA: Diagnosis not present

## 2016-08-31 DIAGNOSIS — C7951 Secondary malignant neoplasm of bone: Secondary | ICD-10-CM

## 2016-08-31 DIAGNOSIS — E279 Disorder of adrenal gland, unspecified: Secondary | ICD-10-CM | POA: Diagnosis not present

## 2016-08-31 DIAGNOSIS — C50919 Malignant neoplasm of unspecified site of unspecified female breast: Secondary | ICD-10-CM | POA: Diagnosis not present

## 2016-08-31 DIAGNOSIS — R188 Other ascites: Secondary | ICD-10-CM | POA: Diagnosis not present

## 2016-08-31 MED ORDER — OXYCODONE HCL 5 MG PO TABS: 5.0000 mg | ORAL_TABLET | Freq: Four times a day (QID) | ORAL | 0 refills | Status: DC | PRN

## 2016-08-31 NOTE — Progress Notes (Signed)
Patient Care Team: Olena Mater, MD as PCP - General (Internal Medicine) Elveria Rising, MD (Obstetrics and Gynecology)  DIAGNOSIS:  Encounter Diagnoses  Name Primary?  . Metastatic breast cancer ([place])   . Bone metastases ([place]) Yes    SUMMARY OF ONCOLOGIC HISTORY:   Metastatic breast cancer ([place])   01/21/2014 Initial Diagnosis    Right breast cancer status post mastectomy and axillary lymph node dissection multifocal disease 4.4 cm, 1.1 cm, 1 mm, ER 93%, PR 40-50%, HER-2 negative ratio 1.3, T2 N3 a stage IIIc diagnosed at [facility] by Dr.Katragadda; treated with adjuvant chemotherapy with before meals 4 followed by Taxotere 12 followed by radiation and tamoxifen      07/15/2016 Relapse/Recurrence    Patient presented with abdominal distention and ascites; CT 07/15/2016 revealed ascites, pleural effusion, pericardial masses, 8 mm right liver nodule, adrenal nodule; biopsy metastatic breast cancer ER/PR positive HER-2 negative; MRI pelvis 07/15/2016 pelvic mass is 5.27 minutes, 5.6 cm, right adrenal nodule 4.2 cm      07/23/2016 Miscellaneous    Paracentesis 1.7 L cytology negative, peritoneal catheter, patient claims that once or twice a week      07/26/2016 PET scan    Pelvic masses, intrarenal mass, portal caval lymph node, anterior mediastinal lymph node, several bone lesions T8 compression fracture and right femur concern for impending fracture, right pleural effusion, ascites      08/08/2016 -  Anti-estrogen oral therapy    Faslodex (loading doses given on 08/08/2016 and 08/23/2016) Palbociclib (started 08/23/2016) and Xgeva (last injection 08/12/2016)      08/10/2016 - 08/24/2016 Radiation Therapy    Radiation therapy to T8 and bilateral femurs (complication: Esophagitis): Radiation done in [place] ([facility])       CHIEF COMPLIANT:  Follow-up after recent emergency room visit for back pain  INTERVAL HISTORY: Tamara Byrd is a   51 year old with above-mentioned symptoms metastatic breast cancer who is currently on Faslodex with Palbociclib. She was in the emergency room complaining of severe back pain and had a CT of the back which showed metastatic disease and T4 T5 T8 and T9 along with a T8 pathological fracture. There was no worsening of her bone metastatic disease and she was discharged home after giving pain medications.  REVIEW OF SYSTEMS:   Constitutional: Denies fevers, chills or abnormal weight loss Eyes: Denies blurriness of vision Ears, nose, mouth, throat, and face: Denies mucositis or sore throat Respiratory: Denies cough, dyspnea or wheezes Cardiovascular: Denies palpitation, chest discomfort Gastrointestinal:  Abdominal distention requiring drainage through  Indwelling drain Skin: Denies abnormal skin rashes Lymphatics: Denies new lymphadenopathy or easy bruising Neurological: Generalized weakness and uses a wheelchair to get around. Behavioral/Psych: Mood is stable, no new changes  Extremities: No lower extremity edema  All other systems were reviewed with the patient and are negative.  I have reviewed the past medical history, past surgical history, social history and family history with the patient and they are unchanged from previous note.  ALLERGIES:  has No Known Allergies.  MEDICATIONS:  Current Outpatient Prescriptions  Medication Sig Dispense Refill  . amphetamine-dextroamphetamine (ADDERALL) 20 MG tablet Take 10 mg by mouth daily.    Marland Kitchen buPROPion (WELLBUTRIN SR) 150 MG 12 hr tablet Take 150 mg by mouth 2 (two) times daily.    Marland Kitchen buPROPion (WELLBUTRIN XL) 150 MG 24 hr tablet Take 150 mg by mouth daily.    . Calcium Carbonate-Vit D-Min (CALCIUM 1200) 1200-1000 MG-UNIT CHEW Chew 1 tablet by mouth daily.  30 each   . diphenhydrAMINE (BENADRYL) 50 MG capsule Take 1 capsule (50 mg total) by mouth at bedtime as needed. 30 capsule 0  . docusate sodium (COLACE) 100 MG capsule Take 100 mg by mouth  daily.    . fentaNYL (DURAGESIC - DOSED MCG/HR) 50 MCG/HR Place 1 patch (50 mcg total) onto the skin every 3 (three) days. 10 patch 0  . ibuprofen (ADVIL,MOTRIN) 800 MG tablet Take 800 mg by mouth every 8 (eight) hours as needed for pain.    Marland Kitchen ondansetron (ZOFRAN) 8 MG tablet Take 1 tablet (8 mg total) by mouth 2 (two) times daily. 20 tablet 0  . OVER THE COUNTER MEDICATION Take 2 tablets by mouth daily. Over the counter cleanse, pt states that it does have senna in it.    Marland Kitchen oxyCODONE (OXY IR/ROXICODONE) 5 MG immediate release tablet Take 1 tablet (5 mg total) by mouth every 6 (six) hours as needed for severe pain. 60 tablet 0  . palbociclib (IBRANCE) 125 MG capsule Take 1 capsule (125 mg total) by mouth daily with breakfast. Take whole with food. 21 capsule 0  . pantoprazole sodium (PROTONIX) 40 mg/20 mL PACK Place 20 mLs (40 mg total) into feeding tube daily. 30 each   . phentermine 37.5 MG capsule Take 0.5 mg by mouth daily.    . sucralfate (CARAFATE) 1 GM/10ML suspension Take 10 mLs (1 g total) by mouth 4 (four) times daily -  with meals and at bedtime. 420 mL 0   No current facility-administered medications for this visit.     PHYSICAL EXAMINATION: ECOG PERFORMANCE STATUS: 3 - Symptomatic, >50% confined to bed  Vitals:   08/31/16 1529  BP: 103/72  Pulse: (!) 106  Resp: 18  Temp: 98.2 F (36.8 C)   Filed Weights   08/31/16 1529  Weight: 148 lb 4.8 oz (67.3 kg)    GENERAL:alert, no distress and comfortable SKIN: skin color, texture, turgor are normal, no rashes or significant lesions EYES: normal, Conjunctiva are pink and non-injected, sclera clear OROPHARYNX:no exudate, no erythema and lips, buccal mucosa, and tongue normal  NECK: supple, thyroid normal size, non-tender, without nodularity LYMPH:  no palpable lymphadenopathy in the cervical, axillary or inguinal LUNGS:  Pleural effusions HEART: regular rate & rhythm and no murmurs and no lower extremity edema ABDOMEN: to  moderately distended MUSCULOSKELETAL:no cyanosis of digits and no clubbing  NEURO: alert & oriented x 3 with fluent speech, no focal motor/sensory deficits EXTREMITIES: No lower extremity edema   LABORATORY DATA:  I have reviewed the data as listed   Chemistry      Component Value Date/Time   NA 136 08/30/2016 1954   NA 138 08/27/2016 1326   K 3.5 08/30/2016 1954   K 4.0 08/27/2016 1326   CL 105 08/30/2016 1954   CO2 22 08/30/2016 1954   CO2 25 08/27/2016 1326   BUN 10 08/30/2016 1954   BUN 8.4 08/27/2016 1326   CREATININE 0.73 08/30/2016 1954   CREATININE 0.7 08/27/2016 1326      Component Value Date/Time   CALCIUM 6.6 (L) 08/30/2016 1954   CALCIUM 8.1 (L) 08/27/2016 1326   ALKPHOS 59 08/27/2016 1326   AST 16 08/27/2016 1326   ALT 8 08/27/2016 1326   BILITOT <0.22 08/27/2016 1326       Lab Results  Component Value Date   WBC 5.0 08/30/2016   HGB 11.2 (L) 08/30/2016   HCT 33.1 (L) 08/30/2016   MCV 90.4 08/30/2016  PLT 351 08/30/2016   NEUTROABS 4.7 08/30/2016    ASSESSMENT & PLAN:  Metastatic breast cancer ([place]) Metastatic breast cancer with bone, lymph nodes, liver, pleural effusion, ascites, pelvic masses and intrarenal metastases diagnosed 07/15/2016 (Prior history of stage IIIc breast cancer December 2015 treated with mastectomy, adjuvant chemotherapy, radiation and tamoxifen)  Current treatment: Palbociclib with Faslodex and Xgeva Patient was counseled extensively regarding the risks and benefits of Palbociclib. She appears to be tolerating it fairly well.  Plan: 1. Recheck labs in 2 weeks 2. established the patient to receive Faslodex injections once a month 3. Bone metastases: Xgeva once a month along with calcium and vitamin D.  Pain from bone metastases: Currently on fentanyl patch 50 g every 72 hours along with over-the-counter NSAIDs.  I gave her a prescription for oxycodone 5 mg to be taken as needed. Return to clinic in 2 weeks for recheck of  labs and after that once a month   I spent 25 minutes talking to the patient of which more than half was spent in counseling and coordination of care.  Orders Placed This Encounter  Procedures  . CBC with Differential    Standing Status:   Future    Standing Expiration Date:   08/31/2017  . Comprehensive metabolic panel    Standing Status:   Future    Standing Expiration Date:   08/31/2017   The patient has a good understanding of the overall plan. she agrees with it. she will call with any problems that may develop before the next visit here.   Rulon Eisenmenger, MD 08/31/16

## 2016-08-31 NOTE — Telephone Encounter (Signed)
Pt calling to follow up with Dr.Gudena after she went to ED last night for a new t5 fracture. Pt has breast ca with metastatic bone mets. Pt was discharged and was instructed to see Dr.Gudena and to discuss pain medications for pt. Pt would like an appt with Dr.Gudena today. Pt confirmed schedule for this afternoon.

## 2016-08-31 NOTE — Telephone Encounter (Signed)
Oral Oncology Pharmacist Encounter  Received new prescription for Ibrance for the treatment of metastatic, hormone-receptor positive, Her-2 negative breast cancer in conjunction with Faslodex, planned duration until disease progression or unacceptable toxicity.  Patient previously started on Ibrance and Faslodex before transferring care to Dr. Lindi Adie. Ibrance start date: 08/24/16  Labs from 8/3 and 8/6 assessed, OK for continued treatment.  Current medication list in Epic reviewed, no significant DDIs with Ibrance identified.  I spoke with patient in exam room for overview of: Ibrance   Pt is doing well.   One fill of Tamara Byrd will be shipped to patient's Byrd from the [facility] on manufacturer free voucher, patient will use this fill for cycle #2 to start on 09/21/16.   Subsequent fills will be through specific specialty pharmacy per insurance requirement and will be arranged by Oral Oncology Clinic.  Counseled patient on administration, dosing, side effects, safe handling, and monitoring. Patient will take Ibrance 136m capsule, 1 capsule by mouth once daily, with whole food, for 21 days on, 7 days off. Patient states she has been taking her Ibrance with supper daily for consistent timing of dosing.  Due to fairly constant nausea from esophagitis, patient takes Protonix and Zofran once daily in the morning as nausea prevention.  She is able to eat more as the day progresses.  Patient will avoid grapefruit and grapefruit juice  Side effects include but not limited to: decreased blood counts, alopecia, rash, nausea, stomach upset, and fatigue.    Reviewed with patient importance of keeping a medication schedule and plan for any missed doses.  Tamara Byrd understanding and appreciation.   All questions answered.  Patient knows to call the office with questions or concerns. Oral Oncology Clinic will continue to follow.  Thank you,  JJohny Drilling PharmD, BCPS,  BCOP 08/31/2016  4:12 PM Oral Oncology Clinic [phone]

## 2016-08-31 NOTE — Assessment & Plan Note (Signed)
Metastatic breast cancer with bone, lymph nodes, liver, pleural effusion, ascites, pelvic masses and intrarenal metastases diagnosed 07/15/2016 (Prior history of stage IIIc breast cancer December 2015 treated with mastectomy, adjuvant chemotherapy, radiation and tamoxifen)  Current treatment: Palbociclib with Faslodex and Xgeva Patient was counseled extensively by Dr.Ummed from [facility] regarding the risks and benefits of Palbociclib. She appears to be tolerating it fairly well.  Plan: 1. Recheck labs in 2 weeks 2. established the patient to receive Faslodex injections once a month 3. Bone metastases: Xgeva once a month along with calcium and vitamin D.  Pain from bone metastases: Currently on fentanyl patch 50 g every 72 hours along with over-the-counter NSAIDs.  Return to clinic in 2 weeks for recheck of labs and after that once a month

## 2016-09-01 ENCOUNTER — Encounter: Payer: Self-pay | Admitting: Hematology and Oncology

## 2016-09-01 NOTE — Progress Notes (Signed)
Received PA determination from [place] [employer] [place].  PA approved 08/30/16-01/24/38.  Called [facility]([place]) to advise of approval. She states it went through.

## 2016-09-03 ENCOUNTER — Telehealth: Payer: Self-pay

## 2016-09-03 NOTE — Telephone Encounter (Signed)
Spoke with patient concerning 8/27 appointment.

## 2016-09-06 ENCOUNTER — Telehealth: Payer: Self-pay

## 2016-09-06 NOTE — Telephone Encounter (Signed)
Pt called for wellbutrin XL 150 mg refill. Dr Lindi Adie did not originally prescribe these.  In conversation the pt stated she does not need to take these but needs to wean off them. She does have 3 left and sees Dr Lindi Adie on Monday. Instructed her to take 1 every other day and to talk to Dr Lindi Adie on Monday.

## 2016-09-09 ENCOUNTER — Emergency Department (HOSPITAL_COMMUNITY): Payer: BLUE CROSS/BLUE SHIELD

## 2016-09-09 ENCOUNTER — Inpatient Hospital Stay (HOSPITAL_COMMUNITY)
Admission: EM | Admit: 2016-09-09 | Discharge: 2016-09-16 | DRG: 947 | Disposition: A | Discharge status: Home / Self Care | Payer: BLUE CROSS/BLUE SHIELD | Attending: Internal Medicine | Admitting: Internal Medicine

## 2016-09-09 ENCOUNTER — Encounter (HOSPITAL_COMMUNITY): Payer: Self-pay | Admitting: Emergency Medicine

## 2016-09-09 ENCOUNTER — Encounter: Admit: 2016-09-09 | Discharge: 2016-09-10

## 2016-09-09 DIAGNOSIS — R69 Illness, unspecified: Principal | ICD-10-CM

## 2016-09-09 DIAGNOSIS — D61818 Other pancytopenia: Secondary | ICD-10-CM | POA: Diagnosis present

## 2016-09-09 DIAGNOSIS — Z79899 Other long term (current) drug therapy: Secondary | ICD-10-CM

## 2016-09-09 DIAGNOSIS — E43 Unspecified severe protein-calorie malnutrition: Secondary | ICD-10-CM | POA: Diagnosis present

## 2016-09-09 DIAGNOSIS — F419 Anxiety disorder, unspecified: Secondary | ICD-10-CM | POA: Diagnosis present

## 2016-09-09 DIAGNOSIS — R188 Other ascites: Secondary | ICD-10-CM

## 2016-09-09 DIAGNOSIS — C50919 Malignant neoplasm of unspecified site of unspecified female breast: Secondary | ICD-10-CM | POA: Diagnosis present

## 2016-09-09 DIAGNOSIS — T451X5A Adverse effect of antineoplastic and immunosuppressive drugs, initial encounter: Secondary | ICD-10-CM | POA: Diagnosis present

## 2016-09-09 DIAGNOSIS — R740 Nonspecific elevation of levels of transaminase and lactic acid dehydrogenase [LDH]: Secondary | ICD-10-CM | POA: Diagnosis present

## 2016-09-09 DIAGNOSIS — G893 Neoplasm related pain (acute) (chronic): Secondary | ICD-10-CM | POA: Diagnosis not present

## 2016-09-09 DIAGNOSIS — K5903 Drug induced constipation: Secondary | ICD-10-CM | POA: Diagnosis not present

## 2016-09-09 DIAGNOSIS — C799 Secondary malignant neoplasm of unspecified site: Secondary | ICD-10-CM

## 2016-09-09 DIAGNOSIS — R600 Localized edema: Secondary | ICD-10-CM | POA: Diagnosis present

## 2016-09-09 DIAGNOSIS — R52 Pain, unspecified: Secondary | ICD-10-CM

## 2016-09-09 DIAGNOSIS — R180 Malignant ascites: Secondary | ICD-10-CM | POA: Diagnosis present

## 2016-09-09 DIAGNOSIS — Z853 Personal history of malignant neoplasm of breast: Secondary | ICD-10-CM

## 2016-09-09 DIAGNOSIS — C7951 Secondary malignant neoplasm of bone: Secondary | ICD-10-CM | POA: Diagnosis present

## 2016-09-09 DIAGNOSIS — T402X5A Adverse effect of other opioids, initial encounter: Secondary | ICD-10-CM | POA: Diagnosis not present

## 2016-09-09 DIAGNOSIS — Z87891 Personal history of nicotine dependence: Secondary | ICD-10-CM

## 2016-09-09 DIAGNOSIS — K652 Spontaneous bacterial peritonitis: Secondary | ICD-10-CM | POA: Diagnosis present

## 2016-09-09 DIAGNOSIS — R109 Unspecified abdominal pain: Secondary | ICD-10-CM | POA: Diagnosis not present

## 2016-09-09 DIAGNOSIS — F32A Depression, unspecified: Secondary | ICD-10-CM | POA: Diagnosis present

## 2016-09-09 DIAGNOSIS — Z66 Do not resuscitate: Secondary | ICD-10-CM | POA: Diagnosis present

## 2016-09-09 DIAGNOSIS — C786 Secondary malignant neoplasm of retroperitoneum and peritoneum: Secondary | ICD-10-CM | POA: Diagnosis present

## 2016-09-09 DIAGNOSIS — C7989 Secondary malignant neoplasm of other specified sites: Secondary | ICD-10-CM | POA: Diagnosis present

## 2016-09-09 DIAGNOSIS — F329 Major depressive disorder, single episode, unspecified: Secondary | ICD-10-CM | POA: Diagnosis present

## 2016-09-09 DIAGNOSIS — Z6824 Body mass index (BMI) 24.0-24.9, adult: Secondary | ICD-10-CM

## 2016-09-09 DIAGNOSIS — C779 Secondary and unspecified malignant neoplasm of lymph node, unspecified: Secondary | ICD-10-CM | POA: Diagnosis present

## 2016-09-09 DIAGNOSIS — B9561 Methicillin susceptible Staphylococcus aureus infection as the cause of diseases classified elsewhere: Secondary | ICD-10-CM | POA: Diagnosis present

## 2016-09-09 LAB — CBC
HEMATOCRIT: 30.5 % — AB (ref 36.0–46.0)
Hemoglobin: 10.1 g/dL — ABNORMAL LOW (ref 12.0–15.0)
MCH: 30.5 pg (ref 26.0–34.0)
MCHC: 33.1 g/dL (ref 30.0–36.0)
MCV: 92.1 fL (ref 78.0–100.0)
PLATELETS: 82 10*3/uL — AB (ref 150–400)
RBC: 3.31 MIL/uL — AB (ref 3.87–5.11)
RDW: 13.5 % (ref 11.5–15.5)
WBC: 3.1 10*3/uL — ABNORMAL LOW (ref 4.0–10.5)

## 2016-09-09 LAB — COMPREHENSIVE METABOLIC PANEL
ALT: 19 U/L (ref 14–54)
AST: 23 U/L (ref 15–41)
Albumin: 1.3 g/dL — ABNORMAL LOW (ref 3.5–5.0)
Alkaline Phosphatase: 81 U/L (ref 38–126)
Anion gap: 9 (ref 5–15)
BUN: 6 mg/dL (ref 6–20)
CHLORIDE: 107 mmol/L (ref 101–111)
CO2: 24 mmol/L (ref 22–32)
CREATININE: 0.46 mg/dL (ref 0.44–1.00)
Calcium: 7 mg/dL — ABNORMAL LOW (ref 8.9–10.3)
GFR calc non Af Amer: >60 mL/min (ref >60)
Glucose, Bld: 88 mg/dL (ref 65–99)
POTASSIUM: 3.5 mmol/L (ref 3.5–5.1)
SODIUM: 140 mmol/L (ref 135–145)
Total Bilirubin: 0.5 mg/dL (ref 0.3–1.2)
Total Protein: 4.7 g/dL — ABNORMAL LOW (ref 6.5–8.1)

## 2016-09-09 LAB — URINALYSIS, ROUTINE W REFLEX MICROSCOPIC
BILIRUBIN URINE: NEGATIVE
Glucose, UA: NEGATIVE mg/dL
KETONES UR: 5 mg/dL — AB
Nitrite: NEGATIVE
PH: 5 (ref 5.0–8.0)
PROTEIN: NEGATIVE mg/dL
Specific Gravity, Urine: 1.01 (ref 1.005–1.030)

## 2016-09-09 LAB — LIPASE, BLOOD: LIPASE: 16 U/L (ref 11–51)

## 2016-09-09 MED ORDER — CALCIUM CARBONATE ANTACID 500 MG PO CHEW: 1.0000 | CHEWABLE_TABLET | Freq: Every day | ORAL | Status: DC

## 2016-09-09 MED ORDER — HYDROMORPHONE HCL 1 MG/ML IJ SOLN
1.0000 mg | INTRAMUSCULAR | Status: DC | PRN
  Administered 2016-09-09: 1 mg via INTRAVENOUS
  Filled 2016-09-09: qty 1

## 2016-09-09 MED ORDER — IOPAMIDOL (ISOVUE-300) INJECTION 61%
30.0000 mL | Freq: Once | INTRAVENOUS | Status: AC | PRN
  Administered 2016-09-09: 30 mL via ORAL

## 2016-09-09 MED ORDER — DIPHENHYDRAMINE HCL 25 MG PO CAPS: 50.0000 mg | ORAL_CAPSULE | Freq: Every evening | ORAL | Status: DC | PRN

## 2016-09-09 MED ORDER — PANTOPRAZOLE SODIUM 40 MG IV SOLR
40.0000 mg | Freq: Every day | INTRAVENOUS | Status: DC
  Administered 2016-09-09 – 2016-09-13 (×5): 40 mg via INTRAVENOUS
  Filled 2016-09-09 (×5): qty 40

## 2016-09-09 MED ORDER — KETOROLAC TROMETHAMINE 30 MG/ML IJ SOLN
30.0000 mg | Freq: Four times a day (QID) | INTRAMUSCULAR | Status: DC | PRN
  Administered 2016-09-09: 30 mg via INTRAVENOUS
  Filled 2016-09-09: qty 1

## 2016-09-09 MED ORDER — SODIUM CHLORIDE 0.9 % IV BOLUS (SEPSIS)
1000.0000 mL | Freq: Once | INTRAVENOUS | Status: AC
  Administered 2016-09-09: 1000 mL via INTRAVENOUS

## 2016-09-09 MED ORDER — DIPHENHYDRAMINE HCL 50 MG/ML IJ SOLN
25.0000 mg | Freq: Four times a day (QID) | INTRAMUSCULAR | Status: DC | PRN
  Administered 2016-09-09 – 2016-09-11 (×3): 25 mg via INTRAVENOUS
  Filled 2016-09-09 (×3): qty 1

## 2016-09-09 MED ORDER — ONDANSETRON HCL 4 MG/2ML IJ SOLN
4.0000 mg | Freq: Once | INTRAMUSCULAR | Status: AC
  Administered 2016-09-09: 4 mg via INTRAVENOUS
  Filled 2016-09-09: qty 2

## 2016-09-09 MED ORDER — IOPAMIDOL (ISOVUE-300) INJECTION 61%
INTRAVENOUS | Status: AC
  Filled 2016-09-09: qty 30

## 2016-09-09 MED ORDER — HYDROMORPHONE HCL 1 MG/ML IJ SOLN
1.0000 mg | Freq: Once | INTRAMUSCULAR | Status: AC
  Administered 2016-09-09: 1 mg via INTRAVENOUS
  Filled 2016-09-09: qty 1

## 2016-09-09 MED ORDER — FENTANYL 75 MCG/HR TD PT72
75.0000 ug | MEDICATED_PATCH | TRANSDERMAL | Status: DC
  Administered 2016-09-10: 75 ug via TRANSDERMAL
  Filled 2016-09-09: qty 1

## 2016-09-09 MED ORDER — CALCIUM 1200 1200-1000 MG-UNIT PO CHEW: 1.0000 | CHEWABLE_TABLET | Freq: Every day | ORAL | Status: DC

## 2016-09-09 MED ORDER — HYDROMORPHONE HCL 1 MG/ML IJ SOLN
1.0000 mg | INTRAMUSCULAR | Status: AC | PRN
  Administered 2016-09-09 (×2): 1 mg via INTRAVENOUS
  Filled 2016-09-09 (×2): qty 1

## 2016-09-09 MED ORDER — ONDANSETRON HCL 4 MG/2ML IJ SOLN
4.0000 mg | Freq: Four times a day (QID) | INTRAMUSCULAR | Status: DC | PRN
  Administered 2016-09-10 – 2016-09-13 (×6): 4 mg via INTRAVENOUS
  Filled 2016-09-09 (×6): qty 2

## 2016-09-09 MED ORDER — HYDROMORPHONE HCL-NACL 0.5-0.9 MG/ML-% IV SOSY
1.0000 mg | PREFILLED_SYRINGE | INTRAVENOUS | Status: AC | PRN
  Administered 2016-09-09 – 2016-09-11 (×8): 1 mg via INTRAVENOUS
  Filled 2016-09-09 (×8): qty 2

## 2016-09-09 MED ORDER — LORAZEPAM 2 MG/ML IJ SOLN
0.5000 mg | INTRAMUSCULAR | Status: DC | PRN
  Administered 2016-09-12: 0.5 mg via INTRAVENOUS
  Filled 2016-09-09: qty 0.25
  Filled 2016-09-09: qty 1

## 2016-09-09 MED ORDER — PANTOPRAZOLE SODIUM 40 MG PO PACK
40.0000 mg | PACK | Freq: Every day | ORAL | Status: DC
  Filled 2016-09-09: qty 20

## 2016-09-09 MED ORDER — BUPROPION HCL ER (XL) 150 MG PO TB24: 150.0000 mg | ORAL_TABLET | Freq: Every day | ORAL | Status: DC

## 2016-09-09 MED ORDER — ACETAMINOPHEN 325 MG PO TABS: 650.0000 mg | ORAL_TABLET | Freq: Four times a day (QID) | ORAL | Status: DC | PRN

## 2016-09-09 MED ORDER — SUCRALFATE 1 GM/10ML PO SUSP: 1.0000 g | Freq: Three times a day (TID) | ORAL | Status: DC

## 2016-09-09 MED ORDER — POTASSIUM CHLORIDE IN NACL 20-0.9 MEQ/L-% IV SOLN
INTRAVENOUS | Status: DC
  Administered 2016-09-09: 23:00:00 via INTRAVENOUS
  Administered 2016-09-10: 1000 mL via INTRAVENOUS
  Administered 2016-09-11 – 2016-09-13 (×4): via INTRAVENOUS
  Administered 2016-09-13: 1000 mL via INTRAVENOUS
  Filled 2016-09-09 (×9): qty 1000

## 2016-09-09 MED ORDER — PALBOCICLIB 125 MG PO CAPS: 125.0000 mg | ORAL_CAPSULE | Freq: Every day | ORAL | Status: DC

## 2016-09-09 MED ORDER — HYDROMORPHONE HCL-NACL 0.5-0.9 MG/ML-% IV SOSY: 1.0000 mg | PREFILLED_SYRINGE | INTRAVENOUS | Status: DC | PRN

## 2016-09-09 MED ORDER — ONDANSETRON HCL 4 MG PO TABS
4.0000 mg | ORAL_TABLET | Freq: Four times a day (QID) | ORAL | Status: DC | PRN
  Administered 2016-09-15: 4 mg via ORAL
  Filled 2016-09-09: qty 1

## 2016-09-09 NOTE — ED Notes (Signed)
ED Provider at bedside. 

## 2016-09-09 NOTE — H&P (Signed)
History and Physical    Tamara Byrd:[id number] DOB: 11-12-1965 DOA: 09/09/2016  PCP: Olena Mater, MD   Patient coming from: Home.  I have personally briefly reviewed patient's old medical records in [place]  Chief Complaint: Abdominal pain.  HPI: Tamara Byrd is a 51 y.o. female with medical history significant of depression, anxiety, breast cancer, which unfortunately has recurred as she was diagnosed with metastatic breast cancer about 6 weeks ago. She has multiple metastasis in her abdomen, which required insertion of peritoneal drain for malignant ascites. She is currently receiving palliative chemotherapy. She is coming to the emergency department today with complaints of severe abdominal pain for since last night. She mentions that last night she had 2 very painful bowel movements. She subsequently felt that the pain started getting worse and extending through all her right-sided abdomen and radiates into her back. She describes the pain as constant, with colicky exacerbations. She took some oxycodone and Advil at home, but this did not help. The patient states that her peritoneal drain was unable to drain any fluid today. She complains of significant nausea, worsening fatigue, decreased appetite and sleeps, but denies fever, chills, emesis, diarrhea, constipation, melena or hematochezia. She denies chest pain, dizziness, but states that in the past 2 days, she has been having palpitations and noticed lower extremity edema (I suspect pressure on abdominal venous circulation from ascites and distention). She complains of decreased urinary volume, but denies dysuria or frequency.   ED Course: Initial vital signs temperature 36.7C, pulse 93, blood pressure 103/71 mmHg, respirations 16 7 O2 sat 95% on room air. She received IV fluids, several rounds of hydromorphone 1 mg IVP and ondansetron 4 mg IVP 1 dose. Urinalysis shows a small leukocyturia and hemoglobinuria. WBC 3.1,  hemoglobin 10.1 g/dL and platelets 82. Her CMP was within normal limits except for total protein of 4.7 and albumin of 1.3 g/dL. Calcium level was 7.0, but it is 9.2 mg/dL corrected to an albumin of 4.0.  Imaging: Her CT was concerning for perforated distal small bowel loop. Widespread metastasis and malignant ascites among other findings. Please see images and full radiology report for further detail. The case was discussed by Dr. Marcha Dutton with Dr. Leighton Ruff (general surgery). Dr. Marcello Moores review the films and did not believe that there was a perforation. However, in any case, she would not recommend any extensive surgical procedure given the patient's clinical state and extensive metastatic disease.  Review of Systems: As per HPI otherwise 10 point review of systems negative.    Past Medical History:  Diagnosis Date  . Breast cancer ([place])   . Depression   . Medical history non-contributory     Past Surgical History:  Procedure Laterality Date  . ABDOMINOPLASTY  1998  . BREAST REDUCTION SURGERY  1998  . CESAREAN SECTION     x 2  . CYST EXCISION  2013   from wrist  . LAPAROSCOPY N/A 05/01/2012   Procedure: LAPAROSCOPY OPERATIVE with right  salpingectomy;  Surgeon: Azalia Bilis, MD;  Location: [facility];  Service: Gynecology;  Laterality: N/A;  . SALPINGOOPHORECTOMY Left 05/01/2012   Procedure: SALPINGO OOPHORECTOMY ;  Surgeon: Azalia Bilis, MD;  Location: [facility];  Service: Gynecology;  Laterality: Left;  . TUBAL LIGATION  1994     reports that she has quit smoking. She has never used smokeless tobacco. She reports that she does not drink alcohol or use drugs.  No Known Allergies  Family History  Problem  Relation Age of Onset  . Depression Mother     Prior to Admission medications   Medication Sig Start Date End Date Taking? Authorizing Provider  acetaminophen (TYLENOL) 500 MG tablet Take 1,000 mg by mouth every 6 (six) hours as needed.   Yes [provider]  buPROPion  (WELLBUTRIN XL) 150 MG 24 hr tablet Take 150 mg by mouth daily.   Yes [provider]  Calcium Carbonate-Vit D-Min (CALCIUM 1200) 1200-1000 MG-UNIT CHEW Chew 1 tablet by mouth daily. 08/27/16  Yes Nicholas Lose, MD  diphenhydrAMINE (BENADRYL) 50 MG capsule Take 1 capsule (50 mg total) by mouth at bedtime as needed. 08/27/16  Yes Nicholas Lose, MD  docusate sodium (COLACE) 100 MG capsule Take 100 mg by mouth daily.   Yes [provider]  fentaNYL (DURAGESIC - DOSED MCG/HR) 50 MCG/HR Place 1 patch (50 mcg total) onto the skin every 3 (three) days. 08/27/16  Yes Nicholas Lose, MD  ibuprofen (ADVIL,MOTRIN) 800 MG tablet Take 800 mg by mouth every 8 (eight) hours as needed for pain.   Yes [provider]  ondansetron (ZOFRAN) 8 MG tablet Take 1 tablet (8 mg total) by mouth 2 (two) times daily. 08/27/16  Yes Nicholas Lose, MD  oxyCODONE (OXY IR/ROXICODONE) 5 MG immediate release tablet Take 1 tablet (5 mg total) by mouth every 6 (six) hours as needed for severe pain. 08/31/16  Yes Nicholas Lose, MD  palbociclib Leslee Home) 125 MG capsule Take 1 capsule (125 mg total) by mouth daily with breakfast. Take whole with food. 08/27/16  Yes Nicholas Lose, MD  pantoprazole sodium (PROTONIX) 40 mg/20 mL PACK Place 20 mLs (40 mg total) into feeding tube daily. 08/27/16  Yes Nicholas Lose, MD  sucralfate (CARAFATE) 1 GM/10ML suspension Take 10 mLs (1 g total) by mouth 4 (four) times daily -  with meals and at bedtime. 08/27/16   Nicholas Lose, MD    Physical Exam: Vitals:   09/09/16 1937 09/09/16 2003 09/09/16 2051 09/09/16 2130  BP: 97/70 109/75 112/77 111/63  Pulse: (!) 101 (!) 106 (!) 112 (!) 114  Resp: 20 17 18  (!) 22  Temp:    98.2 F (36.8 C)  TempSrc:      SpO2: 92% 98% 98% 99%    Constitutional: In moderate distress due to pain. Eyes: PERRL, lids and conjunctivae normal ENMT: Mucous membranes and lips are dry. Posterior pharynx clear of any exudate or lesions. Neck: normal, supple, no  masses, no thyromegaly, left EJ IV catheter in place Respiratory: Decreased breath sounds on bases, otherwise clear to auscultation bilaterally, no wheezing, no crackles. Normal respiratory effort. No accessory muscle use.  Cardiovascular: Tachycardic at 105 BPM, no murmurs / rubs / gallops. 1+ lower extremity edema. 2+ pedal pulses. No carotid bruits.  Abdomen: Distended due to ascites, positive right lower and right upper quadrant tenderness, no guarding/rebound/masses palpated. No hepatosplenomegaly. Bowel sounds positive.  Musculoskeletal: no clubbing / cyanosis. Good ROM, no contractures. Normal muscle tone.  Skin: no significant rashes, lesions, ulcers on limited skin exam. Neurologic: CN 2-12 grossly intact. Sensation intact, DTR normal. Strength 5/5 in all 4.  Psychiatric: Normal judgment and insight. Alert and oriented x 4. Mildly anxious mood due to pain.    Labs on Admission: I have personally reviewed following labs and imaging studies  CBC:  Recent Labs Lab 09/09/16 1413  WBC 3.1*  HGB 10.1*  HCT 30.5*  MCV 92.1  PLT 82*   Basic Metabolic Panel:  Recent Labs Lab 09/09/16  1413  NA 140  K 3.5  CL 107  CO2 24  GLUCOSE 88  BUN 6  CREATININE 0.46  CALCIUM 7.0*   GFR: Estimated Creatinine Clearance: 74 mL/min (by C-G formula based on SCr of 0.46 mg/dL). Liver Function Tests:  Recent Labs Lab 09/09/16 1413  AST 23  ALT 19  ALKPHOS 81  BILITOT 0.5  PROT 4.7*  ALBUMIN 1.3*    Recent Labs Lab 09/09/16 1413  LIPASE 16   No results for input(s): AMMONIA in the last 168 hours. Coagulation Profile: No results for input(s): INR, PROTIME in the last 168 hours. Cardiac Enzymes: No results for input(s): CKTOTAL, CKMB, CKMBINDEX, TROPONINI in the last 168 hours. BNP (last 3 results) No results for input(s): PROBNP in the last 8760 hours. HbA1C: No results for input(s): HGBA1C in the last 72 hours. CBG: No results for input(s): GLUCAP in the last 168  hours. Lipid Profile: No results for input(s): CHOL, HDL, LDLCALC, TRIG, CHOLHDL, LDLDIRECT in the last 72 hours. Thyroid Function Tests: No results for input(s): TSH, T4TOTAL, FREET4, T3FREE, THYROIDAB in the last 72 hours. Anemia Panel: No results for input(s): VITAMINB12, FOLATE, FERRITIN, TIBC, IRON, RETICCTPCT in the last 72 hours. Urine analysis:    Component Value Date/Time   COLORURINE YELLOW 09/09/2016 1629   APPEARANCEUR HAZY (A) 09/09/2016 1629   LABSPEC 1.010 09/09/2016 1629   PHURINE 5.0 09/09/2016 1629   GLUCOSEU NEGATIVE 09/09/2016 1629   HGBUR SMALL (A) 09/09/2016 1629   BILIRUBINUR NEGATIVE 09/09/2016 1629   KETONESUR 5 (A) 09/09/2016 1629   PROTEINUR NEGATIVE 09/09/2016 1629   NITRITE NEGATIVE 09/09/2016 1629   LEUKOCYTESUR SMALL (A) 09/09/2016 1629    Radiological Exams on Admission: Ct Abdomen Pelvis Wo Contrast  Result Date: 09/09/2016 CLINICAL DATA:  51 year old female with history of abdominal pain radiating to the back. History of breast cancer, currently undergoing therapy. EXAM: CT ABDOMEN AND PELVIS WITHOUT CONTRAST TECHNIQUE: Multidetector CT imaging of the abdomen and pelvis was performed following the standard protocol without IV contrast. COMPARISON:  None. FINDINGS: Lower chest: Small right and trace pleural effusions lying dependently with some associated passive subsegmental atelectasis in the dependent portions of the lower lobes of the lungs bilaterally. Status post right mastectomy with right-sided breast implant. Hepatobiliary: No discrete cystic or solid hepatic lesions are confidently identified on today's noncontrast CT examination. Unenhanced appearance of the gallbladder is unremarkable. Pancreas: No definite pancreatic mass or peripancreatic inflammatory changes are noted on today's noncontrast CT examination. Spleen: Unremarkable. Adrenals/Urinary Tract: 4 mm nonobstructive calculus in the upper pole collecting system of the right kidney. No  additional calculi are identified within the left renal collecting system, along the course of either ureter or within the lumen of the urinary bladder. No hydroureteronephrosis to indicate urinary tract obstruction at this time. Urinary bladder is normal in appearance. Bilateral adrenal glands are enlarged and nodular in appearance, with the largest of these masses noted on the right side measuring approximately 2.9 x 4.8 cm (axial image 30 of series 2) with either significant mass effect upon or direct invasion into the adjacent inferior vena cava. Stomach/Bowel: Unenhanced appearance of the stomach is normal. No pathologic dilatation of small bowel or colon. The appendix is not confidently identified. In the right upper quadrant of the abdomen immediately anterior to the liver best appreciated on axial image 15 of series 2, coronal image 52 of series 4 and sagittal image 65 of series 5 there is a gas collection which appears to be  extraluminal. Based on the appearance on coronal image 52 of series 4, this appears to represent focal contained perforation of a loop of ileum (best appreciated when viewing images on lung window setting). Vascular/Lymphatic: Aortic atherosclerosis. No definite aneurysm identified in the abdominal or pelvic vasculature on today's noncontrast CT examination. Multiple prominent borderline enlarged retroperitoneal lymph nodes measuring up to 8 mm in short axis are nonspecific. Reproductive: Unenhanced appearance of the uterus and left ovary is unremarkable. In the right side of the pelvis closely associated with the right adnexal region there is a large soft tissue mass measuring 7.5 x 9.3 x 6.0 cm (axial image 76 of series 2 and coronal image 57 of series 4). Other: Large volume of ascites. Abdominal drain entering the right-sided abdomen with the tip extending into the mid anatomic pelvis anteriorly slightly to the left of midline. No pneumoperitoneum. Musculoskeletal: Several mixed  lytic and sclerotic lesions are noted throughout the visualized axial and appendicular skeleton, compatible with widespread metastatic disease to the bones. The largest of these lesions occupies the T8 vertebral body where there is a pathologic compression fracture with approximately 15-20% loss of anterior vertebral body height. IMPRESSION: 1. Findings are highly concerning for contained perforated loop of distal small bowel (presumably ileum) in the right upper quadrant immediately anterior to the liver, as detailed above. Surgical consultation is recommended. 2. 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 3. Large volume of presumably malignant ascites. 4. Small right and trace left pleural effusions with areas of passive atelectasis in the lung bases bilaterally. 5. Aortic atherosclerosis. 6. Additional incidental findings, as above. These results were called by telephone at the time of interpretation on 09/09/2016 at 6:51 pm to Dr. Canary Brim, who verbally acknowledged these results. Aortic Atherosclerosis (ICD10-I70.0). Electronically Signed   By: Vinnie Langton M.D.   On: 09/09/2016 18:59    EKG: Independently reviewed   Assessment/Plan Principal Problem:   Intractable abdominal pain Observation/MedSurg. Keep nothing by mouth Continue IV fluids. Increase Duragesic patch to 75 g every 72 hours. Hydromorphone 1 mg IVPB every 30 minutes as needed. Antiemetics as needed. Per Dr. Leighton Ruff, she does not believe that the patient has a perforation. In any case, she did not recommend any extensive surgical procedures,   given current clinical condition of the patient. Please consult IR in a.m. for palliative paracentesis and evaluation of peritoneal drain. Consider palliative medicine consult.  Active Problems:   Metastatic breast cancer [facility]) The patient is currently getting palliative chemotherapy. Will have to stop Ibrance due to abdominal pain. Consider palliative care consult in the morning.    Depression Hold Wellbutrin. Anxiety control.    Anxiety Will start lorazepam 0.5 mg IVP every 2 hours as needed.    Malignant ascites Per patient, he has not been able to drain today. Will most likely need US guided paracentesis in the morning. Will also need review of draining catheter.    Pancytopenia ([place]) Secondary to chemotherapy. Follow-up CBC in a.m.    DVT prophylaxis: SCDs. Code Status: DO NOT RESUSCITATE/DO NOT INTUBATE. Family Communication: Her mother was present in the room. Disposition Plan: Admit for IV hydration, pain control and possible US  guided paracentesis tomorrow. Consults called: Requested PICC line (the patient was stuck 14 different times). Admission status: Inpatient/MedSurg.   Reubin Milan MD [facility] Pager [phone].  If 7PM-7AM, please contact night-coverage [email or web address] Password [facility]  09/09/2016, 11:55 PM

## 2016-09-09 NOTE — ED Provider Notes (Signed)
7:44 PM  Radiology called about CT results- they are concerned for contained perforation in right upper abdomen, not related to drain.  D/w surgery- Dr. Marcello Moores, she will review CT and call me back.  Pt is not likely to be a good surgical candidate but she will review the chart.    Dr. Marcello Moores has reviewed CT and does not feel this is c/w perforation.  Pt continues to have a great deal of pain, requesting admission for pain control.  D/w dr. Olevia Bowens, [facility] for admission.     Pixie Casino, MD 09/10/16 Laureen Abrahams

## 2016-09-09 NOTE — ED Triage Notes (Signed)
Patient here with complaints of abdominal pain radiating around to back. Hx of breast cancer, currently under treatment. Reports that she also has an abdominal drain that may not be working. States that she has not had any drainage. Oxycodone with no relief.

## 2016-09-09 NOTE — ED Provider Notes (Signed)
[place] DEPT Provider Note   CSN: [id number] Arrival date & time: 09/09/16  1252     History   Chief Complaint Chief Complaint  Patient presents with  . Abdominal Pain    HPI Tamara Byrd is a 51 y.o. female.  HPI Patient is a 51 year old female with a history of recurrent breast cancer now metastatic to her abdomen with omental implants.  She presents emergency department with worsening right-sided abdominal pain radiating around towards her active.  She's tried her home oxycodone without improvement.  She has a fentanyl patch.  She has a peritoneal drain for recurrent ascites.  She previously was an ultrasound tech.  She been doing well until approximately 6 weeks ago which point she was diagnosed with recurrent now metastatic breast cancer with new metastases to the abdomen.  She is receiving chemotherapy at this time.  She denies fevers or chills.  She is accompanied by her mother and having severe pain at this time she reports nausea and vomiting without diarrhea.   Past Medical History:  Diagnosis Date  . Breast cancer ([place])   . Depression   . Medical history non-contributory     Patient Active Problem List   Diagnosis Date Noted  . Bone metastases ([place]) 08/27/2016  . Metastatic breast cancer ([place]) 07/11/2016  . 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 Depression     Past Surgical History:  Procedure Laterality Date  . ABDOMINOPLASTY  1998  . BREAST REDUCTION SURGERY  1998  . CESAREAN SECTION     x 2  . CYST EXCISION  2013   from wrist  . LAPAROSCOPY N/A 05/01/2012   Procedure: LAPAROSCOPY OPERATIVE with right  salpingectomy;  Surgeon: Azalia Bilis, MD;  Location: [facility];  Service: Gynecology;  Laterality: N/A;  . SALPINGOOPHORECTOMY Left 05/01/2012   Procedure: SALPINGO OOPHORECTOMY ;  Surgeon: Azalia Bilis, MD;  Location: [facility];  Service: Gynecology;  Laterality: Left;  . TUBAL LIGATION  1994    OB History    Gravida Para Term Preterm AB Living   3  2 2  0 1 2   SAB TAB Ectopic Multiple Live Births   0 0 0   2       Home Medications    Prior to Admission medications   Medication Sig Start Date End Date Taking? Authorizing Provider  acetaminophen (TYLENOL) 500 MG tablet Take 1,000 mg by mouth every 6 (six) hours as needed.   Yes [provider]  buPROPion (WELLBUTRIN XL) 150 MG 24 hr tablet Take 150 mg by mouth daily.   Yes [provider]  Calcium Carbonate-Vit D-Min (CALCIUM 1200) 1200-1000 MG-UNIT CHEW Chew 1 tablet by mouth daily. 08/27/16  Yes Nicholas Lose, MD  diphenhydrAMINE (BENADRYL) 50 MG capsule Take 1 capsule (50 mg total) by mouth at bedtime as needed. 08/27/16  Yes Nicholas Lose, MD  docusate sodium (COLACE) 100 MG capsule Take 100 mg by mouth daily.   Yes [provider]  fentaNYL (DURAGESIC - DOSED MCG/HR) 50 MCG/HR Place 1 patch (50 mcg total) onto the skin every 3 (three) days. 08/27/16  Yes Nicholas Lose, MD  ibuprofen (ADVIL,MOTRIN) 800 MG tablet Take 800 mg by mouth every 8 (eight) hours as needed for pain.   Yes [provider]  ondansetron (ZOFRAN) 8 MG tablet Take 1 tablet (8 mg total) by mouth 2 (two) times daily. 08/27/16  Yes Nicholas Lose, MD  oxyCODONE (OXY IR/ROXICODONE) 5 MG immediate release tablet Take 1 tablet (5 mg  total) by mouth every 6 (six) hours as needed for severe pain. 08/31/16  Yes Nicholas Lose, MD  palbociclib Leslee Home) 125 MG capsule Take 1 capsule (125 mg total) by mouth daily with breakfast. Take whole with food. 08/27/16  Yes Nicholas Lose, MD  pantoprazole sodium (PROTONIX) 40 mg/20 mL PACK Place 20 mLs (40 mg total) into feeding tube daily. 08/27/16  Yes Nicholas Lose, MD  sucralfate (CARAFATE) 1 GM/10ML suspension Take 10 mLs (1 g total) by mouth 4 (four) times daily -  with meals and at bedtime. 08/27/16   Nicholas Lose, MD    Family History Family History  Problem Relation Age of Onset  . Depression Mother     Social History Social History  Substance Use  Topics  . Smoking status: Former Research scientist (life sciences)  . Smokeless tobacco: Never Used  . Alcohol use No     Allergies   Patient has no known allergies.   Review of Systems Review of Systems  All other systems reviewed and are negative.    Physical Exam Updated Vital Signs BP 107/77 (BP Location: Left Arm)   Pulse (!) 101   Temp 98.1 F (36.7 C) (Oral)   Resp 18   LMP 11/23/2013 Comment: last period was very light, spotting the week before  SpO2 97%   Physical Exam  Constitutional: She is oriented to person, place, and time. She appears well-developed and well-nourished. No distress.  HENT:  Head: Normocephalic and atraumatic.  Eyes: EOM are normal.  Neck: Normal range of motion.  Cardiovascular: Normal rate, regular rhythm and normal heart sounds.   Pulmonary/Chest: Effort normal and breath sounds normal.  Abdominal: Soft. She exhibits no distension.  Right-sided generalized abdominal tenderness.  Musculoskeletal: Normal range of motion.  Neurological: She is alert and oriented to person, place, and time.  Skin: Skin is warm and dry.  Psychiatric: She has a normal mood and affect. Judgment normal.  Nursing note and vitals reviewed.    ED Treatments / Results  Labs (all labs ordered are listed, but only abnormal results are displayed) Labs Reviewed  COMPREHENSIVE METABOLIC PANEL - Abnormal; Notable for the following:       Result Value   Calcium 7.0 (*)    Total Protein 4.7 (*)    Albumin 1.3 (*)    All other components within normal limits  CBC - Abnormal; Notable for the following:    WBC 3.1 (*)    RBC 3.31 (*)    Hemoglobin 10.1 (*)    HCT 30.5 (*)    Platelets 82 (*)    All other components within normal limits  URINALYSIS, ROUTINE W REFLEX MICROSCOPIC - Abnormal; Notable for the following:    APPearance HAZY (*)    Hgb urine dipstick SMALL (*)    Ketones, ur 5 (*)    Leukocytes, UA SMALL (*)    Bacteria, UA FEW (*)    Squamous Epithelial / LPF 0-5 (*)     All other components within normal limits  LIPASE, BLOOD    EKG  EKG Interpretation None       Radiology No results found.  Procedures Procedures (including critical care time)  Medications Ordered in ED Medications  HYDROmorphone (DILAUDID) injection 1 mg (1 mg Intravenous Given 09/09/16 1425)  iopamidol (ISOVUE-300) 61 % injection (  Canceled Entry 09/09/16 1528)  HYDROmorphone (DILAUDID) injection 1 mg (1 mg Intravenous Given 09/09/16 1526)  ondansetron (ZOFRAN) injection 4 mg (4 mg Intravenous Given 09/09/16 1425)  sodium chloride  0.9 % bolus 1,000 mL (0 mLs Intravenous Stopped 09/09/16 1527)  iopamidol (ISOVUE-300) 61 % injection 30 mL (30 mLs Oral Contrast Given 09/09/16 1521)     Initial Impression / Assessment and Plan / ED Course  I have reviewed the triage vital signs and the nursing notes.  Pertinent labs & imaging results that were available during my care of the patient were reviewed by me and considered in my medical decision making (see chart for details).     Aggressive pain management while in the emergency department.  CT scan to better define any new intra-abdominal pathology.  The scope may represent worsening omental spread of her cancer.  Focus on symptom control this time.  CT imaging pending  Care to Dr Canary Brim  Final Clinical Impressions(s) / ED Diagnoses   Final diagnoses:  None    New Prescriptions New Prescriptions   No medications on file     Jola Schmidt, MD 09/09/16 [id number]

## 2016-09-10 DIAGNOSIS — C786 Secondary malignant neoplasm of retroperitoneum and peritoneum: Secondary | ICD-10-CM | POA: Diagnosis present

## 2016-09-10 DIAGNOSIS — T402X5A Adverse effect of other opioids, initial encounter: Secondary | ICD-10-CM | POA: Diagnosis not present

## 2016-09-10 DIAGNOSIS — G893 Neoplasm related pain (acute) (chronic): Secondary | ICD-10-CM | POA: Diagnosis present

## 2016-09-10 DIAGNOSIS — Z7189 Other specified counseling: Secondary | ICD-10-CM | POA: Diagnosis not present

## 2016-09-10 DIAGNOSIS — T451X5A Adverse effect of antineoplastic and immunosuppressive drugs, initial encounter: Secondary | ICD-10-CM | POA: Diagnosis present

## 2016-09-10 DIAGNOSIS — R740 Nonspecific elevation of levels of transaminase and lactic acid dehydrogenase [LDH]: Secondary | ICD-10-CM | POA: Diagnosis present

## 2016-09-10 DIAGNOSIS — C50919 Malignant neoplasm of unspecified site of unspecified female breast: Secondary | ICD-10-CM | POA: Diagnosis not present

## 2016-09-10 DIAGNOSIS — Z66 Do not resuscitate: Secondary | ICD-10-CM | POA: Diagnosis present

## 2016-09-10 DIAGNOSIS — R600 Localized edema: Secondary | ICD-10-CM | POA: Diagnosis present

## 2016-09-10 DIAGNOSIS — R188 Other ascites: Secondary | ICD-10-CM | POA: Diagnosis not present

## 2016-09-10 DIAGNOSIS — Z515 Encounter for palliative care: Secondary | ICD-10-CM | POA: Diagnosis not present

## 2016-09-10 DIAGNOSIS — C7951 Secondary malignant neoplasm of bone: Secondary | ICD-10-CM | POA: Diagnosis present

## 2016-09-10 DIAGNOSIS — R109 Unspecified abdominal pain: Secondary | ICD-10-CM | POA: Diagnosis not present

## 2016-09-10 DIAGNOSIS — Z853 Personal history of malignant neoplasm of breast: Secondary | ICD-10-CM | POA: Diagnosis not present

## 2016-09-10 DIAGNOSIS — B9561 Methicillin susceptible Staphylococcus aureus infection as the cause of diseases classified elsewhere: Secondary | ICD-10-CM | POA: Diagnosis present

## 2016-09-10 DIAGNOSIS — K652 Spontaneous bacterial peritonitis: Secondary | ICD-10-CM | POA: Diagnosis present

## 2016-09-10 DIAGNOSIS — F419 Anxiety disorder, unspecified: Secondary | ICD-10-CM | POA: Diagnosis present

## 2016-09-10 DIAGNOSIS — K5903 Drug induced constipation: Secondary | ICD-10-CM | POA: Diagnosis not present

## 2016-09-10 DIAGNOSIS — C7989 Secondary malignant neoplasm of other specified sites: Secondary | ICD-10-CM | POA: Diagnosis present

## 2016-09-10 DIAGNOSIS — C779 Secondary and unspecified malignant neoplasm of lymph node, unspecified: Secondary | ICD-10-CM | POA: Diagnosis present

## 2016-09-10 DIAGNOSIS — R180 Malignant ascites: Secondary | ICD-10-CM | POA: Diagnosis present

## 2016-09-10 DIAGNOSIS — D61818 Other pancytopenia: Secondary | ICD-10-CM | POA: Diagnosis present

## 2016-09-10 DIAGNOSIS — F329 Major depressive disorder, single episode, unspecified: Secondary | ICD-10-CM | POA: Diagnosis present

## 2016-09-10 DIAGNOSIS — C799 Secondary malignant neoplasm of unspecified site: Secondary | ICD-10-CM | POA: Diagnosis not present

## 2016-09-10 DIAGNOSIS — Z87891 Personal history of nicotine dependence: Secondary | ICD-10-CM | POA: Diagnosis not present

## 2016-09-10 DIAGNOSIS — E43 Unspecified severe protein-calorie malnutrition: Secondary | ICD-10-CM | POA: Diagnosis present

## 2016-09-10 DIAGNOSIS — Z6824 Body mass index (BMI) 24.0-24.9, adult: Secondary | ICD-10-CM | POA: Diagnosis not present

## 2016-09-10 DIAGNOSIS — Z79899 Other long term (current) drug therapy: Secondary | ICD-10-CM | POA: Diagnosis not present

## 2016-09-10 LAB — CBC
HCT: 29.6 % — ABNORMAL LOW (ref 36.0–46.0)
Hemoglobin: 9.7 g/dL — ABNORMAL LOW (ref 12.0–15.0)
MCH: 30 pg (ref 26.0–34.0)
MCHC: 32.8 g/dL (ref 30.0–36.0)
MCV: 91.6 fL (ref 78.0–100.0)
Platelets: 72 10*3/uL — ABNORMAL LOW (ref 150–400)
RBC: 3.23 MIL/uL — ABNORMAL LOW (ref 3.87–5.11)
RDW: 13.5 % (ref 11.5–15.5)
WBC: 2.8 10*3/uL — ABNORMAL LOW (ref 4.0–10.5)

## 2016-09-10 LAB — BASIC METABOLIC PANEL
Anion gap: 9 (ref 5–15)
CHLORIDE: 105 mmol/L (ref 101–111)
CO2: 23 mmol/L (ref 22–32)
Calcium: 6.8 mg/dL — ABNORMAL LOW (ref 8.9–10.3)
Creatinine, Ser: 0.51 mg/dL (ref 0.44–1.00)
GFR calc Af Amer: >60 mL/min (ref >60)
GFR calc non Af Amer: >60 mL/min (ref >60)
Glucose, Bld: 94 mg/dL (ref 65–99)
POTASSIUM: 3.3 mmol/L — AB (ref 3.5–5.1)
SODIUM: 137 mmol/L (ref 135–145)

## 2016-09-10 MED ORDER — SODIUM CHLORIDE 0.9% FLUSH
10.0000 mL | INTRAVENOUS | Status: DC | PRN
  Administered 2016-09-14: 10 mL
  Filled 2016-09-10: qty 40

## 2016-09-10 MED ORDER — DOCUSATE SODIUM 100 MG PO CAPS
100.0000 mg | ORAL_CAPSULE | Freq: Every day | ORAL | Status: DC | PRN
  Administered 2016-09-10: 100 mg via ORAL
  Filled 2016-09-10: qty 1

## 2016-09-10 NOTE — Progress Notes (Signed)
[facility] Progress Note  Patient: Tamara Byrd LFY:[id number]   PCP: Olena Mater, MD DOB: 1965-04-13   DOA: 09/09/2016   DOS: 09/10/2016   Date of Service: the patient was seen and examined on 09/10/2016  Subjective: feeling better, still in severe pain every now and then. No nausea no vomiting   Brief hospital course: Pt. with PMH of Metastatic breast cancer with recurrence, malignant ascites and S/P peritoneal catheter placement, depression, anxiety; admitted on 09/09/2016, presented with complaint of severe abdominal pain, was found to have abdominal pain related to cancer uncontrolled. Currently further plan is continue pain regimen control.  Assessment and Plan: 1. Intractable abdominal pain. Abdominal metastasis with malignant ascites.  Increase Duragesic patch to 75 g every 72 hours. Hydromorphone 1 mg IVPB every 30 minutes as needed. Antiemetics as needed. Per Dr. Leighton Ruff, she does not believe that the patient has a perforation. In any case, she did not recommend any extensive surgical procedures,  given current clinical condition of the patient. Palliative medicine consulted. We'll attempt to drain the fluid with the catheter in If no success will consult with IR tomorrow.  Active Problems:   Metastatic breast cancer [facility]) The patient is currently getting palliative chemotherapy. Will have to stop Ibrance due to abdominal pain as well as thrombocytopenia Palliative care consulted primarily for pain management as well as assistance with goals of care.    Depression Hold Wellbutrin. Anxiety control.    Anxiety Will start lorazepam 0.5 mg IVP every 2 hours as needed.    Malignant ascites Per patient, he has not been able to drain. We will drain with gravity today. If no success, will consult IR for reevaluation tomorrow.    Pancytopenia ([place]) Secondary to chemotherapy. Currently holding chemotherapy for the same. Monitor daily CBC.  Diet:  Clear liquid diet DVT Prophylaxis: mechanical compression device  Advance goals of care discussion: DNR DNI  Family Communication: no family was present at bedside, at the time of interview.   Disposition:  Discharge to home.  Consultants: Oncology, general surgery, palliative medicine Procedures: PICC line placement due to poor IV access  Antibiotics: Anti-infectives    None       Objective: Physical Exam: Vitals:   09/09/16 2051 09/09/16 2130 09/10/16 0618 09/10/16 1401  BP: 112/77 111/63 (!) 98/58 (!) 126/56  Pulse: (!) 112 (!) 114 (!) 107 (!) 110  Resp: 18 (!) 22 18 18   Temp:  98.2 F (36.8 C) 98.6 F (37 C) 99.3 F (37.4 C)  TempSrc:   Oral Oral  SpO2: 98% 99% 99% 99%  Weight:   65 kg (143 lb 4.8 oz)   Height:   5\' 4"  (1.626 m)     Intake/Output Summary (Last 24 hours) at 09/10/16 1453 Last data filed at 09/10/16 1438  Gross per 24 hour  Intake             3570 ml  Output             1400 ml  Net             2170 ml   Filed Weights   09/10/16 0618  Weight: 65 kg (143 lb 4.8 oz)   General: Alert, Awake and Oriented to Time, Place and Person. Appear in moderate distress, affect appropriate Eyes: PERRL, Conjunctiva normal ENT: Oral Mucosa clear moist. Neck: no JVD, no Abnormal Mass Or lumps Cardiovascular: S1 and S2 Present, no Murmur, Peripheral Pulses Present Respiratory: normal respiratory effort, Bilateral Air entry  equal and Decreased, no use of accessory muscle, Clear to Auscultation, no Crackles, no wheezes Abdomen: Bowel Sound present, Soft and moderate diffuse tenderness, skin: no redness, no Rash, no induration Extremities: trace Pedal edema, no calf tenderness Neurologic: Grossly no focal neuro deficit. Bilaterally Equal motor strength  Data Reviewed: CBC:  Recent Labs Lab 09/09/16 1413 09/10/16 0517  WBC 3.1* 2.8*  HGB 10.1* 9.7*  HCT 30.5* 29.6*  MCV 92.1 91.6  PLT 82* 72*   Basic Metabolic Panel:  Recent Labs Lab 09/09/16 1413  09/10/16 0517  NA 140 137  K 3.5 3.3*  CL 107 105  CO2 24 23  GLUCOSE 88 94  BUN 6 <5*  CREATININE 0.46 0.51  CALCIUM 7.0* 6.8*    Liver Function Tests:  Recent Labs Lab 09/09/16 1413  AST 23  ALT 19  ALKPHOS 81  BILITOT 0.5  PROT 4.7*  ALBUMIN 1.3*    Recent Labs Lab 09/09/16 1413  LIPASE 16   No results for input(s): AMMONIA in the last 168 hours. Coagulation Profile: No results for input(s): INR, PROTIME in the last 168 hours. Cardiac Enzymes: No results for input(s): CKTOTAL, CKMB, CKMBINDEX, TROPONINI in the last 168 hours. BNP (last 3 results) No results for input(s): PROBNP in the last 8760 hours. CBG: No results for input(s): GLUCAP in the last 168 hours. Studies: Ct Abdomen Pelvis Wo Contrast  Result Date: 09/09/2016 CLINICAL DATA:  51 year old female with history of abdominal pain radiating to the back. History of breast cancer, currently undergoing therapy. EXAM: CT ABDOMEN AND PELVIS WITHOUT CONTRAST TECHNIQUE: Multidetector CT imaging of the abdomen and pelvis was performed following the standard protocol without IV contrast. COMPARISON:  None. FINDINGS: Lower chest: Small right and trace pleural effusions lying dependently with some associated passive subsegmental atelectasis in the dependent portions of the lower lobes of the lungs bilaterally. Status post right mastectomy with right-sided breast implant. Hepatobiliary: No discrete cystic or solid hepatic lesions are confidently identified on today's noncontrast CT examination. Unenhanced appearance of the gallbladder is unremarkable. Pancreas: No definite pancreatic mass or peripancreatic inflammatory changes are noted on today's noncontrast CT examination. Spleen: Unremarkable. Adrenals/Urinary Tract: 4 mm nonobstructive calculus in the upper pole collecting system of the right kidney. No additional calculi are identified within the left renal collecting system, along the course of either ureter or within  the lumen of the urinary bladder. No hydroureteronephrosis to indicate urinary tract obstruction at this time. Urinary bladder is normal in appearance. Bilateral adrenal glands are enlarged and nodular in appearance, with the largest of these masses noted on the right side measuring approximately 2.9 x 4.8 cm (axial image 30 of series 2) with either significant mass effect upon or direct invasion into the adjacent inferior vena cava. Stomach/Bowel: Unenhanced appearance of the stomach is normal. No pathologic dilatation of small bowel or colon. The appendix is not confidently identified. In the right upper quadrant of the abdomen immediately anterior to the liver best appreciated on axial image 15 of series 2, coronal image 52 of series 4 and sagittal image 65 of series 5 there is a gas collection which appears to be extraluminal. Based on the appearance on coronal image 52 of series 4, this appears to represent focal contained perforation of a loop of ileum (best appreciated when viewing images on lung window setting). Vascular/Lymphatic: Aortic atherosclerosis. No definite aneurysm identified in the abdominal or pelvic vasculature on today's noncontrast CT examination. Multiple prominent borderline enlarged retroperitoneal lymph nodes measuring up  to 8 mm in short axis are nonspecific. Reproductive: Unenhanced appearance of the uterus and left ovary is unremarkable. In the right side of the pelvis closely associated with the right adnexal region there is a large soft tissue mass measuring 7.5 x 9.3 x 6.0 cm (axial image 76 of series 2 and coronal image 57 of series 4). Other: Large volume of ascites. Abdominal drain entering the right-sided abdomen with the tip extending into the mid anatomic pelvis anteriorly slightly to the left of midline. No pneumoperitoneum. Musculoskeletal: Several mixed lytic and sclerotic lesions are noted throughout the visualized axial and appendicular skeleton, compatible with  widespread metastatic disease to the bones. The largest of these lesions occupies the T8 vertebral body where there is a pathologic compression fracture with approximately 15-20% loss of anterior vertebral body height. IMPRESSION: 1. Findings are highly concerning for contained perforated loop of distal small bowel (presumably ileum) in the right upper quadrant immediately anterior to the liver, as detailed above. Surgical consultation is recommended. 2. 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 3. Large volume of presumably malignant ascites. 4. Small right and trace left pleural effusions with areas of passive atelectasis in the lung bases bilaterally. 5. Aortic atherosclerosis. 6. Additional incidental findings, as above. These results were called by telephone at the time of interpretation on 09/09/2016 at 6:51 pm to Dr. Canary Brim, who verbally acknowledged these results. Aortic Atherosclerosis (ICD10-I70.0). Electronically Signed   By: Vinnie Langton M.D.   On: 09/09/2016 18:59    Scheduled Meds: . fentaNYL  75 mcg Transdermal Q72H  . pantoprazole (PROTONIX) IV  40 mg Intravenous QHS   Continuous Infusions: . 0.9 % NaCl with KCl 20 mEq / L 1,000 mL (09/10/16 1205)   PRN Meds: diphenhydrAMINE, HYDROmorphone (DILAUDID) injection, LORazepam, ondansetron **OR** ondansetron (ZOFRAN) IV, sodium chloride flush  Time spent: 35 minutes  Author: Berle Mull, MD [facility] Pager: [phone] 09/10/2016 2:53 PM  If 7PM-7AM, please contact night-coverage at [email or web address], password [facility]

## 2016-09-10 NOTE — Progress Notes (Signed)
Peripherally Inserted Central Catheter/Midline Placement  The IV Nurse has discussed with the patient and/or persons authorized to consent for the patient, the purpose of this procedure and the potential benefits and risks involved with this procedure.  The benefits include less needle sticks, lab draws from the catheter, and the patient may be discharged home with the catheter. Risks include, but not limited to, infection, bleeding, blood clot (thrombus formation), and puncture of an artery; nerve damage and irregular heartbeat and possibility to perform a PICC exchange if needed/ordered by physician.  Alternatives to this procedure were also discussed.  Bard Power PICC patient education guide, fact sheet on infection prevention and patient information card has been provided to patient /or left at bedside.    PICC/Midline Placement Documentation  PICC Double Lumen 09/10/16 PICC Right Brachial 42 cm 2 cm (Active)  Indication for Insertion or Continuance of Line Poor Vasculature-patient has had multiple peripheral attempts or PIVs lasting less than 24 hours 09/10/2016 10:00 AM  Exposed Catheter (cm) 2 cm 09/10/2016 10:00 AM  Dressing Change Due 09/17/16 09/10/2016 10:00 AM       Jule Economy Horton 09/10/2016, 10:57 AM

## 2016-09-10 NOTE — Consult Note (Signed)
[facility] Surgery Consult/Admission Note  Tamara Byrd [birth year]  [id number].    Requesting MD: Dr. Posey Pronto Chief Complaint/Reason for Consult: Abdominal pain  HPI:   Patient is a 51 year old female with a history of breast cancer, with recurrence of metastatic disease diagnosed roughly 6 weeks ago with multiple metastases in her abdomen. She has a peritoneal drain for malignant ascites. She is receiving palliative chemotherapy. She presented to the emergency department yesterday with complaints of severe abdominal pain. She states pain is worse with bowel movements. She states she has a constant level of generalized pain but has intermittent spikes of gripping, crampy, severe left-sided and lower abdominal pain that radiates into her back. Patient states on Monday she was able to drain 1000 mL of fluid from her peritoneal drain. This is normal for her. She states on Tuesday she only drained 500 and on Wednesday only 200. She was unable drain any fluid on Thursday. Wednesday night is when her pain got significantly worse and she had 10-15 small very painful bowel movements through the night Wednesday into Thursday morning. This prompted her to come to the ER. Patient denies associated symptoms. She denies nausea, vomiting, chills, fever, chest pain, shortness of breath, dysuria, hematuria, blood in her stool, diarrhea. Patient states when she was first diagnosed with metastatic disease they drained 3 L of fluid off of her abdomen and the pain she experienced then is very similar to the pain she is experiencing now.   CT scan showed gas collection which appeared to be extraluminal immediately anterior to the liver. Large volume of presumably malignant ascites. Labs: WBC 2.8, Hgb 9.7 Afebrile, mildly tachycardic since admission  ROS:  Review of Systems  Constitutional: Negative for chills and fever.  Respiratory: Negative for shortness of breath.   Cardiovascular: Negative for chest pain.   Gastrointestinal: Positive for abdominal pain. Negative for blood in stool, constipation, diarrhea, nausea and vomiting.  Genitourinary: Negative for dysuria and hematuria.  Skin: Negative for rash.  Neurological: Negative for dizziness and loss of consciousness.  All other systems reviewed and are negative.    Family History  Problem Relation Age of Onset  . Depression Mother     Past Medical History:  Diagnosis Date  . Breast cancer ([place])   . Depression   . Medical history non-contributory     Past Surgical History:  Procedure Laterality Date  . ABDOMINOPLASTY  1998  . BREAST REDUCTION SURGERY  1998  . CESAREAN SECTION     x 2  . CYST EXCISION  2013   from wrist  . LAPAROSCOPY N/A 05/01/2012   Procedure: LAPAROSCOPY OPERATIVE with right  salpingectomy;  Surgeon: Azalia Bilis, MD;  Location: [facility];  Service: Gynecology;  Laterality: N/A;  . SALPINGOOPHORECTOMY Left 05/01/2012   Procedure: SALPINGO OOPHORECTOMY ;  Surgeon: Azalia Bilis, MD;  Location: [facility];  Service: Gynecology;  Laterality: Left;  . TUBAL LIGATION  1994    Social History:  reports that she has quit smoking. She has never used smokeless tobacco. She reports that she does not drink alcohol or use drugs.  Allergies: No Known Allergies  Medications Prior to Admission  Medication Sig Dispense Refill  . acetaminophen (TYLENOL) 500 MG tablet Take 1,000 mg by mouth every 6 (six) hours as needed.    Marland Kitchen buPROPion (WELLBUTRIN XL) 150 MG 24 hr tablet Take 150 mg by mouth daily.    . Calcium Carbonate-Vit D-Min (CALCIUM 1200) 1200-1000 MG-UNIT CHEW Chew 1 tablet by mouth  daily. 30 each   . diphenhydrAMINE (BENADRYL) 50 MG capsule Take 1 capsule (50 mg total) by mouth at bedtime as needed. 30 capsule 0  . docusate sodium (COLACE) 100 MG capsule Take 100 mg by mouth daily.    . fentaNYL (DURAGESIC - DOSED MCG/HR) 50 MCG/HR Place 1 patch (50 mcg total) onto the skin every 3 (three) days. 10 patch 0  . ibuprofen  (ADVIL,MOTRIN) 800 MG tablet Take 800 mg by mouth every 8 (eight) hours as needed for pain.    Marland Kitchen ondansetron (ZOFRAN) 8 MG tablet Take 1 tablet (8 mg total) by mouth 2 (two) times daily. 20 tablet 0  . oxyCODONE (OXY IR/ROXICODONE) 5 MG immediate release tablet Take 1 tablet (5 mg total) by mouth every 6 (six) hours as needed for severe pain. 60 tablet 0  . palbociclib (IBRANCE) 125 MG capsule Take 1 capsule (125 mg total) by mouth daily with breakfast. Take whole with food. 21 capsule 0  . pantoprazole sodium (PROTONIX) 40 mg/20 mL PACK Place 20 mLs (40 mg total) into feeding tube daily. 30 each   . sucralfate (CARAFATE) 1 GM/10ML suspension Take 10 mLs (1 g total) by mouth 4 (four) times daily -  with meals and at bedtime. 420 mL 0    Blood pressure (!) 126/56, pulse (!) 110, temperature 99.3 F (37.4 C), temperature source Oral, resp. rate 18, height 5' 4"  (1.626 m), weight 143 lb 4.8 oz (65 kg), last menstrual period 11/23/2013, SpO2 99 %.  Physical Exam  Constitutional: She is oriented to person, place, and time and well-developed, well-nourished, and in no distress. Vital signs are normal. No distress.  HENT:  Head: Normocephalic and atraumatic.  Nose: Nose normal.  Mouth/Throat: Oropharynx is clear and moist. No oropharyngeal exudate.  Eyes: Pupils are equal, round, and reactive to light. Conjunctivae are normal. Right eye exhibits no discharge. Left eye exhibits no discharge. No scleral icterus.  Neck: Normal range of motion. Neck supple. No tracheal deviation present. No thyromegaly present.  Cardiovascular: Regular rhythm, normal heart sounds and intact distal pulses.  Tachycardia present.  Exam reveals no gallop and no friction rub.   No murmur heard. Pulses:      Radial pulses are 2+ on the right side, and 2+ on the left side.  Pulmonary/Chest: Effort normal and breath sounds normal. No respiratory distress. She has no wheezes. She has no rhonchi. She has no rales.  CTA anteriorly  bilaterally  Abdominal: Bowel sounds are normal. She exhibits distension (markedly ). She exhibits no mass. There is generalized tenderness. There is no rebound and no guarding. No hernia.  Abdomen is markedly distended, fluid wave appreciated, tight but not firm. Mild generalized TTP, no guarding.  Musculoskeletal: Normal range of motion. She exhibits edema (2+ of BLE's). She exhibits no deformity.  Neurological: She is alert and oriented to person, place, and time. GCS score is 15.  Skin: Skin is warm and dry. No rash noted. She is not diaphoretic.  Psychiatric: Mood and affect normal.  Nursing note and vitals reviewed.   Results for orders placed or performed during the hospital encounter of 09/09/16 (from the past 48 hour(s))  Lipase, blood     Status: None   Collection Time: 09/09/16  2:13 PM  Result Value Ref Range   Lipase 16 11 - 51 U/L  Comprehensive metabolic panel     Status: Abnormal   Collection Time: 09/09/16  2:13 PM  Result Value Ref Range   Sodium  140 135 - 145 mmol/L   Potassium 3.5 3.5 - 5.1 mmol/L   Chloride 107 101 - 111 mmol/L   CO2 24 22 - 32 mmol/L   Glucose, Bld 88 65 - 99 mg/dL   BUN 6 6 - 20 mg/dL   Creatinine, Ser 0.46 0.44 - 1.00 mg/dL   Calcium 7.0 (L) 8.9 - 10.3 mg/dL   Total Protein 4.7 (L) 6.5 - 8.1 g/dL   Albumin 1.3 (L) 3.5 - 5.0 g/dL   AST 23 15 - 41 U/L   ALT 19 14 - 54 U/L   Alkaline Phosphatase 81 38 - 126 U/L   Total Bilirubin 0.5 0.3 - 1.2 mg/dL   GFR calc non Af Amer >60 >60 mL/min   GFR calc Af Amer >60 >60 mL/min    Comment: (NOTE) The eGFR has been calculated using the CKD EPI equation. This calculation has not been validated in all clinical situations. eGFR's persistently <60 mL/min signify possible Chronic Kidney Disease.    Anion gap 9 5 - 15  CBC     Status: Abnormal   Collection Time: 09/09/16  2:13 PM  Result Value Ref Range   WBC 3.1 (L) 4.0 - 10.5 K/uL   RBC 3.31 (L) 3.87 - 5.11 MIL/uL   Hemoglobin 10.1 (L) 12.0 - 15.0  g/dL   HCT 30.5 (L) 36.0 - 46.0 %   MCV 92.1 78.0 - 100.0 fL   MCH 30.5 26.0 - 34.0 pg   MCHC 33.1 30.0 - 36.0 g/dL   RDW 13.5 11.5 - 15.5 %   Platelets 82 (L) 150 - 400 K/uL    Comment: REPEATED TO VERIFY SPECIMEN CHECKED FOR CLOTS PLATELET COUNT CONFIRMED BY SMEAR   Urinalysis, Routine w reflex microscopic     Status: Abnormal   Collection Time: 09/09/16  4:29 PM  Result Value Ref Range   Color, Urine YELLOW YELLOW   APPearance HAZY (A) CLEAR   Specific Gravity, Urine 1.010 1.005 - 1.030   pH 5.0 5.0 - 8.0   Glucose, UA NEGATIVE NEGATIVE mg/dL   Hgb urine dipstick SMALL (A) NEGATIVE   Bilirubin Urine NEGATIVE NEGATIVE   Ketones, ur 5 (A) NEGATIVE mg/dL   Protein, ur NEGATIVE NEGATIVE mg/dL   Nitrite NEGATIVE NEGATIVE   Leukocytes, UA SMALL (A) NEGATIVE   RBC / HPF 0-5 0 - 5 RBC/hpf   WBC, UA 6-30 0 - 5 WBC/hpf   Bacteria, UA FEW (A) NONE SEEN   Squamous Epithelial / LPF 0-5 (A) NONE SEEN   Mucous PRESENT   CBC     Status: Abnormal   Collection Time: 09/10/16  5:17 AM  Result Value Ref Range   WBC 2.8 (L) 4.0 - 10.5 K/uL   RBC 3.23 (L) 3.87 - 5.11 MIL/uL   Hemoglobin 9.7 (L) 12.0 - 15.0 g/dL   HCT 29.6 (L) 36.0 - 46.0 %   MCV 91.6 78.0 - 100.0 fL   MCH 30.0 26.0 - 34.0 pg   MCHC 32.8 30.0 - 36.0 g/dL   RDW 13.5 11.5 - 15.5 %   Platelets 72 (L) 150 - 400 K/uL    Comment: CONSISTENT WITH PREVIOUS RESULT  Basic metabolic panel     Status: Abnormal   Collection Time: 09/10/16  5:17 AM  Result Value Ref Range   Sodium 137 135 - 145 mmol/L   Potassium 3.3 (L) 3.5 - 5.1 mmol/L   Chloride 105 101 - 111 mmol/L   CO2 23 22 - 32 mmol/L  Glucose, Bld 94 65 - 99 mg/dL   BUN <5 (L) 6 - 20 mg/dL   Creatinine, Ser 0.51 0.44 - 1.00 mg/dL   Calcium 6.8 (L) 8.9 - 10.3 mg/dL   GFR calc non Af Amer >60 >60 mL/min   GFR calc Af Amer >60 >60 mL/min    Comment: (NOTE) The eGFR has been calculated using the CKD EPI equation. This calculation has not been validated in all clinical  situations. eGFR's persistently <60 mL/min signify possible Chronic Kidney Disease.    Anion gap 9 5 - 15   Ct Abdomen Pelvis Wo Contrast  Result Date: 09/09/2016 CLINICAL DATA:  51 year old female with history of abdominal pain radiating to the back. History of breast cancer, currently undergoing therapy. EXAM: CT ABDOMEN AND PELVIS WITHOUT CONTRAST TECHNIQUE: Multidetector CT imaging of the abdomen and pelvis was performed following the standard protocol without IV contrast. COMPARISON:  None. FINDINGS: Lower chest: Small right and trace pleural effusions lying dependently with some associated passive subsegmental atelectasis in the dependent portions of the lower lobes of the lungs bilaterally. Status post right mastectomy with right-sided breast implant. Hepatobiliary: No discrete cystic or solid hepatic lesions are confidently identified on today's noncontrast CT examination. Unenhanced appearance of the gallbladder is unremarkable. Pancreas: No definite pancreatic mass or peripancreatic inflammatory changes are noted on today's noncontrast CT examination. Spleen: Unremarkable. Adrenals/Urinary Tract: 4 mm nonobstructive calculus in the upper pole collecting system of the right kidney. No additional calculi are identified within the left renal collecting system, along the course of either ureter or within the lumen of the urinary bladder. No hydroureteronephrosis to indicate urinary tract obstruction at this time. Urinary bladder is normal in appearance. Bilateral adrenal glands are enlarged and nodular in appearance, with the largest of these masses noted on the right side measuring approximately 2.9 x 4.8 cm (axial image 30 of series 2) with either significant mass effect upon or direct invasion into the adjacent inferior vena cava. Stomach/Bowel: Unenhanced appearance of the stomach is normal. No pathologic dilatation of small bowel or colon. The appendix is not confidently identified. In the right  upper quadrant of the abdomen immediately anterior to the liver best appreciated on axial image 15 of series 2, coronal image 52 of series 4 and sagittal image 65 of series 5 there is a gas collection which appears to be extraluminal. Based on the appearance on coronal image 52 of series 4, this appears to represent focal contained perforation of a loop of ileum (best appreciated when viewing images on lung window setting). Vascular/Lymphatic: Aortic atherosclerosis. No definite aneurysm identified in the abdominal or pelvic vasculature on today's noncontrast CT examination. Multiple prominent borderline enlarged retroperitoneal lymph nodes measuring up to 8 mm in short axis are nonspecific. Reproductive: Unenhanced appearance of the uterus and left ovary is unremarkable. In the right side of the pelvis closely associated with the right adnexal region there is a large soft tissue mass measuring 7.5 x 9.3 x 6.0 cm (axial image 76 of series 2 and coronal image 57 of series 4). Other: Large volume of ascites. Abdominal drain entering the right-sided abdomen with the tip extending into the mid anatomic pelvis anteriorly slightly to the left of midline. No pneumoperitoneum. Musculoskeletal: Several mixed lytic and sclerotic lesions are noted throughout the visualized axial and appendicular skeleton, compatible with widespread metastatic disease to the bones. The largest of these lesions occupies the T8 vertebral body where there is a pathologic compression fracture with approximately 15-20% loss  of anterior vertebral body height. IMPRESSION: 1. Findings are highly concerning for contained perforated loop of distal small bowel (presumably ileum) in the right upper quadrant immediately anterior to the liver, as detailed above. Surgical consultation is recommended. 2. Widespread metastatic disease including large bilateral (right greater than left) adrenal metastasis (the right adrenal metastasis may invade the IVC),  numerous osseous lesions (including pathologic compression fracture T8), and a large mass in the right side of the pelvis which is favored to represent a metastatic lesion either to the peritoneal cavity or the right ovary (although primary ovarian neoplasm is not excluded). 3. Large volume of presumably malignant ascites. 4. Small right and trace left pleural effusions with areas of passive atelectasis in the lung bases bilaterally. 5. Aortic atherosclerosis. 6. Additional incidental findings, as above. These results were called by telephone at the time of interpretation on 09/09/2016 at 6:51 pm to Dr. Canary Brim, who verbally acknowledged these results. Aortic Atherosclerosis (ICD10-I70.0). Electronically Signed   By: Vinnie Langton M.D.   On: 09/09/2016 18:59      Assessment/Plan  Principal Problem:   Intractable abdominal pain Active Problems:   Depression   Metastatic breast cancer (HCC)   Anxiety   Malignant ascites   Pancytopenia (HCC)  Abdominal pain - CT scan showed gas collection which appeared to be extraluminal immediately anterior to the liver. Large volume of presumably malignant ascites. - Patient states the pain she is experiencing now is similar to when she was first diagnosed with metastatic disease and 3 L of ascites with was drained from her abdomen - Since she has been unable to drain fluid off of her abdomen I suspect that the pain she is experiencing is likely related to ascites - Patient does not appear septic, ill. I do not suspect this is related to perforated bowel - Recommend paracentesis as soon as possible, culture of peritoneal fluid, and a drain study to evaluate her peritoneal catheter  Do not recommend any surgical intervention at this time. We will continue to follow this patient. Thank you for the consult.  Kalman Drape, [facility] Surgery 09/10/2016, 2:45 PM Pager: [phone] Consults: [phone] Mon-Fri 7:00 am-4:30 pm Sat-Sun 7:00  am-11:30 am

## 2016-09-11 ENCOUNTER — Inpatient Hospital Stay (HOSPITAL_COMMUNITY): Payer: BLUE CROSS/BLUE SHIELD

## 2016-09-11 ENCOUNTER — Encounter: Admit: 2016-09-11 | Discharge: 2016-09-12

## 2016-09-11 DIAGNOSIS — R69 Illness, unspecified: Principal | ICD-10-CM

## 2016-09-11 DIAGNOSIS — C799 Secondary malignant neoplasm of unspecified site: Secondary | ICD-10-CM

## 2016-09-11 LAB — LACTATE DEHYDROGENASE, PLEURAL OR PERITONEAL FLUID: LD FL: 121 U/L — AB (ref 3–23)

## 2016-09-11 LAB — CBC WITH DIFFERENTIAL/PLATELET
BASOS PCT: 0 %
Basophils Absolute: 0 10*3/uL (ref 0.0–0.1)
EOS PCT: 0 %
Eosinophils Absolute: 0 10*3/uL (ref 0.0–0.7)
HEMATOCRIT: 26.1 % — AB (ref 36.0–46.0)
Hemoglobin: 8.6 g/dL — ABNORMAL LOW (ref 12.0–15.0)
Lymphocytes Relative: 10 %
Lymphs Abs: 0.2 10*3/uL — ABNORMAL LOW (ref 0.7–4.0)
MCH: 30.2 pg (ref 26.0–34.0)
MCHC: 33 g/dL (ref 30.0–36.0)
MCV: 91.6 fL (ref 78.0–100.0)
MONO ABS: 0.1 10*3/uL (ref 0.1–1.0)
MONOS PCT: 3 %
NEUTROS PCT: 87 %
Neutro Abs: 2 10*3/uL (ref 1.7–7.7)
PLATELETS: 60 10*3/uL — AB (ref 150–400)
RBC: 2.85 MIL/uL — AB (ref 3.87–5.11)
RDW: 13.5 % (ref 11.5–15.5)
WBC: 2.3 10*3/uL — ABNORMAL LOW (ref 4.0–10.5)

## 2016-09-11 LAB — COMPREHENSIVE METABOLIC PANEL
ALBUMIN: 1.2 g/dL — AB (ref 3.5–5.0)
ALK PHOS: 80 U/L (ref 38–126)
ALT: 13 U/L — ABNORMAL LOW (ref 14–54)
AST: 14 U/L — AB (ref 15–41)
Anion gap: 8 (ref 5–15)
BILIRUBIN TOTAL: 0.6 mg/dL (ref 0.3–1.2)
CALCIUM: 6.6 mg/dL — AB (ref 8.9–10.3)
CO2: 24 mmol/L (ref 22–32)
CREATININE: 0.42 mg/dL — AB (ref 0.44–1.00)
Chloride: 106 mmol/L (ref 101–111)
GFR calc Af Amer: >60 mL/min (ref >60)
GLUCOSE: 85 mg/dL (ref 65–99)
POTASSIUM: 3.8 mmol/L (ref 3.5–5.1)
Sodium: 138 mmol/L (ref 135–145)
TOTAL PROTEIN: 4.5 g/dL — AB (ref 6.5–8.1)

## 2016-09-11 LAB — LACTATE DEHYDROGENASE: LDH: 100 U/L (ref 98–192)

## 2016-09-11 LAB — BODY FLUID CELL COUNT WITH DIFFERENTIAL
EOS FL: 0 %
LYMPHS FL: 1 %
MONOCYTE-MACROPHAGE-SEROUS FLUID: 3 % — AB (ref 50–90)
NEUTROPHIL FLUID: 96 % — AB (ref 0–25)
Total Nucleated Cell Count, Fluid: 692 cu mm (ref 0–1000)

## 2016-09-11 LAB — ALBUMIN, PLEURAL OR PERITONEAL FLUID

## 2016-09-11 LAB — PROTEIN, PLEURAL OR PERITONEAL FLUID: Total protein, fluid: 3 g/dL

## 2016-09-11 LAB — GLUCOSE, PLEURAL OR PERITONEAL FLUID: Glucose, Fluid: 52 mg/dL

## 2016-09-11 MED ORDER — BISACODYL 10 MG RE SUPP: 10.0000 mg | Freq: Every day | RECTAL | Status: DC | PRN

## 2016-09-11 MED ORDER — HYDROMORPHONE HCL-NACL 0.5-0.9 MG/ML-% IV SOSY
0.5000 mg | PREFILLED_SYRINGE | INTRAVENOUS | Status: DC | PRN
  Administered 2016-09-11 (×2): 1 mg via INTRAVENOUS
  Filled 2016-09-11 (×2): qty 2

## 2016-09-11 MED ORDER — HYDROMORPHONE 1 MG/ML IV SOLN
INTRAVENOUS | Status: DC
  Administered 2016-09-11: 18:00:00 via INTRAVENOUS
  Administered 2016-09-11: 1.2 mg via INTRAVENOUS
  Administered 2016-09-12: 3 mg via INTRAVENOUS
  Administered 2016-09-12: 1.2 mg via INTRAVENOUS
  Administered 2016-09-12: 3.3 mg via INTRAVENOUS
  Administered 2016-09-12: 1.5 mg via INTRAVENOUS
  Administered 2016-09-12: 2.1 mg via INTRAVENOUS
  Administered 2016-09-12: 3 mg via INTRAVENOUS
  Administered 2016-09-13: 0.9 mg via INTRAVENOUS
  Administered 2016-09-13: 1.5 mg via INTRAVENOUS
  Administered 2016-09-13: 12:00:00 via INTRAVENOUS
  Administered 2016-09-13: 1.8 mg via INTRAVENOUS
  Administered 2016-09-14: 0 mg via INTRAVENOUS
  Administered 2016-09-14: 1.2 mg via INTRAVENOUS
  Administered 2016-09-14: 0.9 mg via INTRAVENOUS
  Administered 2016-09-14: 0.6 mg via INTRAVENOUS
  Filled 2016-09-11 (×2): qty 25

## 2016-09-11 MED ORDER — SODIUM CHLORIDE 0.9% FLUSH
9.0000 mL | INTRAVENOUS | Status: DC | PRN
Start: 2016-09-11 — End: 2016-09-14

## 2016-09-11 MED ORDER — SENNOSIDES-DOCUSATE SODIUM 8.6-50 MG PO TABS
1.0000 | ORAL_TABLET | Freq: Two times a day (BID) | ORAL | Status: DC
  Administered 2016-09-11 – 2016-09-12 (×2): 1 via ORAL
  Filled 2016-09-11 (×2): qty 1

## 2016-09-11 MED ORDER — POLYETHYLENE GLYCOL 3350 17 G PO PACK
17.0000 g | PACK | Freq: Every day | ORAL | Status: DC
  Administered 2016-09-11 – 2016-09-12 (×2): 17 g via ORAL
  Filled 2016-09-11 (×2): qty 1

## 2016-09-11 MED ORDER — HYDROMORPHONE HCL-NACL 0.5-0.9 MG/ML-% IV SOSY: 1.0000 mg | PREFILLED_SYRINGE | INTRAVENOUS | Status: DC | PRN

## 2016-09-11 MED ORDER — NALOXONE HCL 0.4 MG/ML IJ SOLN: 0.4000 mg | INTRAMUSCULAR | Status: DC | PRN

## 2016-09-11 MED ORDER — HYDROMORPHONE HCL-NACL 0.5-0.9 MG/ML-% IV SOSY
0.5000 mg | PREFILLED_SYRINGE | INTRAVENOUS | Status: DC | PRN
  Administered 2016-09-11: 1 mg via INTRAVENOUS
  Filled 2016-09-11: qty 2

## 2016-09-11 NOTE — Progress Notes (Signed)
[facility] Progress Note  Patient: Tamara Byrd NAT:[id number]   PCP: Olena Mater, MD DOB: Sep 18, 1965   DOA: 09/09/2016   DOS: 09/11/2016   Date of Service: the patient was seen and examined on 09/11/2016  Subjective: feeling Bloated as well as have increasing pain. No nausea no vomiting. No bowel movement. No fever no chills as well.  Brief hospital course: Pt. with PMH of Metastatic breast cancer with recurrence, malignant ascites and S/P peritoneal catheter placement, depression, anxiety; admitted on 09/09/2016, presented with complaint of severe abdominal pain, was found to have abdominal pain related to cancer uncontrolled. Currently further plan is continue pain regimen control.  Assessment and Plan: 1. Intractable abdominal pain. Abdominal metastasis with malignant ascites.  Increase Duragesic patch to 75 g every 72 hours. Hydromorphone 1 mg IVPB every 30 minutes as needed. Antiemetics as needed. Her free air could also represent air from the catheter versus contain bowel perforation although the patient would not be a good candidate for any surgical evaluation due to her metastatic disease. Underwent paracentesis today which improved her symptoms but due to loculation or IR the peritoneal drainage catheter most likely not be effective in the long run and ideally should be removed due to its potential for infection Palliative medicine consulted. Appreciate input. May need PCA for pain control.  Active Problems:   Metastatic breast cancer [facility]) The patient is currently getting palliative chemotherapy. Will have to stop Ibrance due to abdominal pain as well as thrombocytopenia Palliative care consulted primarily for pain management as well as assistance with goals of care.    Depression Hold Wellbutrin. Anxiety control.    Anxiety Will start lorazepam 0.5 mg IVP every 2 hours as needed.    Malignant ascites Per patient, he has not been able to drain. We will  drain with gravity today. If no success, will consult IR for reevaluation tomorrow.    Pancytopenia ([place]) Secondary to chemotherapy. Currently holding chemotherapy for the same. Monitor daily CBC.  Diet: full liquid diet DVT Prophylaxis: mechanical compression device  Advance goals of care discussion: DNR DNI  Family Communication: no family was present at bedside, at the time of interview.   Disposition:  Discharge to home.  Consultants: Oncology, general surgery, palliative medicine Procedures: PICC line placement due to poor IV access  Antibiotics: Anti-infectives    None       Objective: Physical Exam: Vitals:   09/11/16 0529 09/11/16 1435 09/11/16 1445 09/11/16 1455  BP: 122/69 122/64 125/64 118/60  Pulse: (!) 103     Resp: 18     Temp: 97.9 F (36.6 C)     TempSrc: Oral     SpO2: 100% 100% 100% 100%  Weight:      Height:        Intake/Output Summary (Last 24 hours) at 09/11/16 1602 Last data filed at 09/11/16 1029  Gross per 24 hour  Intake           1632.5 ml  Output             2000 ml  Net           -367.5 ml   Filed Weights   09/10/16 0618  Weight: 65 kg (143 lb 4.8 oz)   General: Alert, Awake and Oriented to Time, Place and Person. Appear in moderate distress, affect appropriate Eyes: PERRL, Conjunctiva normal ENT: Oral Mucosa clear moist. Neck: no JVD, no Abnormal Mass Or lumps Cardiovascular: S1 and S2 Present, no Murmur, Peripheral Pulses  Present Respiratory: normal respiratory effort, Bilateral Air entry equal and Decreased, no use of accessory muscle, Clear to Auscultation, no Crackles, no wheezes Abdomen: Bowel Sound present, Soft and moderate diffuse tenderness, skin: no redness, no Rash, no induration Extremities: trace Pedal edema, no calf tenderness Neurologic: Grossly no focal neuro deficit. Bilaterally Equal motor strength  Data Reviewed: CBC:  Recent Labs Lab 09/09/16 1413 09/10/16 0517 09/11/16 1225  WBC 3.1* 2.8* 2.3*    NEUTROABS  --   --  PENDING  HGB 10.1* 9.7* 8.6*  HCT 30.5* 29.6* 26.1*  MCV 92.1 91.6 91.6  PLT 82* 72* 60*   Basic Metabolic Panel:  Recent Labs Lab 09/09/16 1413 09/10/16 0517 09/11/16 1225  NA 140 137 138  K 3.5 3.3* 3.8  CL 107 105 106  CO2 24 23 24   GLUCOSE 88 94 85  BUN 6 <5* <5*  CREATININE 0.46 0.51 0.42*  CALCIUM 7.0* 6.8* 6.6*    Liver Function Tests:  Recent Labs Lab 09/09/16 1413 09/11/16 1225  AST 23 14*  ALT 19 13*  ALKPHOS 81 80  BILITOT 0.5 0.6  PROT 4.7* 4.5*  ALBUMIN 1.3* 1.2*    Recent Labs Lab 09/09/16 1413  LIPASE 16   No results for input(s): AMMONIA in the last 168 hours. Coagulation Profile: No results for input(s): INR, PROTIME in the last 168 hours. Cardiac Enzymes: No results for input(s): CKTOTAL, CKMB, CKMBINDEX, TROPONINI in the last 168 hours. BNP (last 3 results) No results for input(s): PROBNP in the last 8760 hours. CBG: No results for input(s): GLUCAP in the last 168 hours. Studies: No results found.  Scheduled Meds: . fentaNYL  75 mcg Transdermal Q72H  . pantoprazole (PROTONIX) IV  40 mg Intravenous QHS   Continuous Infusions: . 0.9 % NaCl with KCl 20 mEq / L 75 mL/hr at 09/11/16 1357   PRN Meds: diphenhydrAMINE, docusate sodium, HYDROmorphone (DILAUDID) injection, LORazepam, ondansetron **OR** ondansetron (ZOFRAN) IV, sodium chloride flush  Time spent: 35 minutes  Author: Berle Mull, MD [facility] Pager: [phone] 09/11/2016 4:02 PM  If 7PM-7AM, please contact night-coverage at [email or web address], password [facility]

## 2016-09-11 NOTE — Procedures (Signed)
Ultrasound-guided diagnostic and therapeutic paracentesis performed yielding 1.6 liters of serous colored fluid. No immediate complications.  The patient's ascites was very complex with significant loculations present, worse in the pelvis.  Bathsheba Durrett E 3:03 PM 09/11/2016

## 2016-09-11 NOTE — Progress Notes (Signed)
Subjective/Chief Complaint: Pt passing gas.  Having small BMs.  No n/v/f/c.  Main complaint is abdominal distention and LLQ abdominal pain.     Objective: Vital signs in last 24 hours: Temp:  [97.9 F (36.6 C)-99.3 F (37.4 C)] 97.9 F (36.6 C) (08/18 0529) Pulse Rate:  [103-110] 103 (08/18 0529) Resp:  [18] 18 (08/18 0529) BP: (122-127)/(56-69) 122/69 (08/18 0529) SpO2:  [98 %-100 %] 100 % (08/18 0529) Last BM Date: 09/09/16  Intake/Output from previous day: 08/17 0701 - 08/18 0700 In: 2400 [P.O.:600; I.V.:1800] Out: 2200 [Urine:2200] Intake/Output this shift: No intake/output data recorded.  General appearance: alert, cooperative and mild distress Cardio: regular rate and rhythm GI: soft, distended, LLQ tenderness.  no r/g. Extremities: extremities normal, atraumatic, no cyanosis or edema  Lab Results:   Recent Labs  09/09/16 1413 09/10/16 0517  WBC 3.1* 2.8*  HGB 10.1* 9.7*  HCT 30.5* 29.6*  PLT 82* 72*   BMET  Recent Labs  09/09/16 1413 09/10/16 0517  NA 140 137  K 3.5 3.3*  CL 107 105  CO2 24 23  GLUCOSE 88 94  BUN 6 <5*  CREATININE 0.46 0.51  CALCIUM 7.0* 6.8*   PT/INR No results for input(s): LABPROT, INR in the last 72 hours. ABG No results for input(s): PHART, HCO3 in the last 72 hours.  Invalid input(s): PCO2, PO2  Studies/Results: Ct Abdomen Pelvis Wo Contrast  Result Date: 09/09/2016 CLINICAL DATA:  51 year old female with history of abdominal pain radiating to the back. History of breast cancer, currently undergoing therapy. EXAM: CT ABDOMEN AND PELVIS WITHOUT CONTRAST TECHNIQUE: Multidetector CT imaging of the abdomen and pelvis was performed following the standard protocol without IV contrast. COMPARISON:  None. FINDINGS: Lower chest: Small right and trace pleural effusions lying dependently with some associated passive subsegmental atelectasis in the dependent portions of the lower lobes of the lungs bilaterally. Status post right  mastectomy with right-sided breast implant. Hepatobiliary: No discrete cystic or solid hepatic lesions are confidently identified on today's noncontrast CT examination. Unenhanced appearance of the gallbladder is unremarkable. Pancreas: No definite pancreatic mass or peripancreatic inflammatory changes are noted on today's noncontrast CT examination. Spleen: Unremarkable. Adrenals/Urinary Tract: 4 mm nonobstructive calculus in the upper pole collecting system of the right kidney. No additional calculi are identified within the left renal collecting system, along the course of either ureter or within the lumen of the urinary bladder. No hydroureteronephrosis to indicate urinary tract obstruction at this time. Urinary bladder is normal in appearance. Bilateral adrenal glands are enlarged and nodular in appearance, with the largest of these masses noted on the right side measuring approximately 2.9 x 4.8 cm (axial image 30 of series 2) with either significant mass effect upon or direct invasion into the adjacent inferior vena cava. Stomach/Bowel: Unenhanced appearance of the stomach is normal. No pathologic dilatation of small bowel or colon. The appendix is not confidently identified. In the right upper quadrant of the abdomen immediately anterior to the liver best appreciated on axial image 15 of series 2, coronal image 52 of series 4 and sagittal image 65 of series 5 there is a gas collection which appears to be extraluminal. Based on the appearance on coronal image 52 of series 4, this appears to represent focal contained perforation of a loop of ileum (best appreciated when viewing images on lung window setting). Vascular/Lymphatic: Aortic atherosclerosis. No definite aneurysm identified in the abdominal or pelvic vasculature on today's noncontrast CT examination. Multiple prominent borderline enlarged retroperitoneal  lymph nodes measuring up to 8 mm in short axis are nonspecific. Reproductive: Unenhanced  appearance of the uterus and left ovary is unremarkable. In the right side of the pelvis closely associated with the right adnexal region there is a large soft tissue mass measuring 7.5 x 9.3 x 6.0 cm (axial image 76 of series 2 and coronal image 57 of series 4). Other: Large volume of ascites. Abdominal drain entering the right-sided abdomen with the tip extending into the mid anatomic pelvis anteriorly slightly to the left of midline. No pneumoperitoneum. Musculoskeletal: Several mixed lytic and sclerotic lesions are noted throughout the visualized axial and appendicular skeleton, compatible with widespread metastatic disease to the bones. The largest of these lesions occupies the T8 vertebral body where there is a pathologic compression fracture with approximately 15-20% loss of anterior vertebral body height. IMPRESSION: 1. Findings are highly concerning for contained perforated loop of distal small bowel (presumably ileum) in the right upper quadrant immediately anterior to the liver, as detailed above. Surgical consultation is recommended. 2. 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 3. Large volume of presumably malignant ascites. 4. Small right and trace left pleural effusions with areas of passive atelectasis in the lung bases bilaterally. 5. Aortic atherosclerosis. 6. Additional incidental findings, as above. These results were called by telephone at the time of interpretation on 09/09/2016 at 6:51 pm to Dr. Canary Brim, who verbally acknowledged these results. Aortic Atherosclerosis (ICD10-I70.0). Electronically Signed   By: Vinnie Langton M.D.   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   Air in RUQ over liver. Catheter appears to be positioned superiorly and most ascites appears to be inferior.   Primary team has consulted IR to see if this can be manipulated.  I do think air in RUQ is secondary to drain manipulation. Does not appear clinically to have bowel perforation. Decreased BMs are likely secondary to constipation given stool burden in right colon and narcotic requirement from bone mets. Clears OK.   LOS: 1 day    [facility] 09/11/2016

## 2016-09-11 NOTE — Consult Note (Signed)
Consultation Note Date: 09/11/2016   Patient Name: Tamara Byrd  DOB: 1965/05/10  MRN: [id number]  Age / Sex: 52 y.o., female  PCP: Olena Mater, MD Referring Physician: Lavina Hamman, MD  Reason for Consultation: Establishing goals of care, Non pain symptom management and Pain control  HPI/Patient Profile: 51 y.o. female  with past medical history of metastatic breast cancer, malignant ascites s/p peritoneal catheter placement, depression, anxiety admitted on 09/09/2016 with uncontrolled abdominal pain.  Palliative consulted for GOC and symptom management.   Clinical Assessment and Goals of Care: I met today with Tamara Byrd.  We discussed clinical course, pain control, and wishes moving forward in regard to advanced directives.    She reports that she has been having increasing pain in her abdomen over the past week as she has been able to get less drainage from her peritoneal catheter.  Since admission, she has been having better luck with pain control with IV dilaudid and has decreased in her needs since being started on higher dose of fentanyl patch.  In addition to pain, concepts specific to advance care planning discussed.   Concept of Hospice and Palliative Care were discussed  Questions and concerns addressed.   PMT will continue to support holistically.  SUMMARY OF RECOMMENDATIONS   - DNR/DNI but full medical care up to point of cardiac or respiratory arrest.  She would like to continue to work with oncology to see how she responds to continuation of current cancer therapies.   - Agree with current pain management regimen.  Will reassess her needs after having patch on for 24 hours.  Agree with surgery that paracentesis would likely help her symptoms. - She would like to name her mother as her 60. - We discussed MOST form.  She will review and let me know if she would like to complete  prior to discharge.  Code Status/Advance Care Planning:  DNR    Palliative Prophylaxis:   Bowel Regimen and Frequent Pain Assessment  Additional Recommendations (Limitations, Scope, Preferences):  Full Scope Treatment to point of cardiac or respiratory arrest  Psycho-social/Spiritual:   Desire for further Chaplaincy support:yes  Additional Recommendations: Caregiving  Support/Resources  Prognosis:   Unable to determine  Discharge Planning: To Be Determined      Primary Diagnoses: Present on Admission: . Intractable abdominal pain . Depression . Metastatic breast cancer ([place]) . Anxiety . Malignant ascites . Pancytopenia ([place])   I have reviewed the medical record, interviewed the patient and family, and examined the patient. The following aspects are pertinent.  Past Medical History:  Diagnosis Date  . Breast cancer ([place])   . Depression   . Medical history non-contributory    Social History   Social History  . Marital status: Legally Separated    Spouse name: N/A  . Number of children: N/A  . Years of education: N/A   Social History Main Topics  . Smoking status: Former Research scientist (life sciences)  . Smokeless tobacco: Never Used  . Alcohol use No  . Drug use:  No  . Sexual activity: Yes    Partners: Male    Birth control/ protection: Other-see comments     Comment: BTL   Other Topics Concern  . None   Social History Narrative   ** Merged History Encounter **       Family History  Problem Relation Age of Onset  . Depression Mother    Scheduled Meds: . fentaNYL  75 mcg Transdermal Q72H  . pantoprazole (PROTONIX) IV  40 mg Intravenous QHS   Continuous Infusions: . 0.9 % NaCl with KCl 20 mEq / L 75 mL/hr at 09/11/16 0112   PRN Meds:.diphenhydrAMINE, docusate sodium, HYDROmorphone (DILAUDID) injection, LORazepam, ondansetron **OR** ondansetron (ZOFRAN) IV, sodium chloride flush Medications Prior to Admission:  Prior to Admission medications   Medication Sig  Start Date End Date Taking? Authorizing Provider  acetaminophen (TYLENOL) 500 MG tablet Take 1,000 mg by mouth every 6 (six) hours as needed.   Yes [provider]  buPROPion (WELLBUTRIN XL) 150 MG 24 hr tablet Take 150 mg by mouth daily.   Yes [provider]  Calcium Carbonate-Vit D-Min (CALCIUM 1200) 1200-1000 MG-UNIT CHEW Chew 1 tablet by mouth daily. 08/27/16  Yes Nicholas Lose, MD  diphenhydrAMINE (BENADRYL) 50 MG capsule Take 1 capsule (50 mg total) by mouth at bedtime as needed. 08/27/16  Yes Nicholas Lose, MD  docusate sodium (COLACE) 100 MG capsule Take 100 mg by mouth daily.   Yes [provider]  fentaNYL (DURAGESIC - DOSED MCG/HR) 50 MCG/HR Place 1 patch (50 mcg total) onto the skin every 3 (three) days. 08/27/16  Yes Nicholas Lose, MD  ibuprofen (ADVIL,MOTRIN) 800 MG tablet Take 800 mg by mouth every 8 (eight) hours as needed for pain.   Yes [provider]  ondansetron (ZOFRAN) 8 MG tablet Take 1 tablet (8 mg total) by mouth 2 (two) times daily. 08/27/16  Yes Nicholas Lose, MD  oxyCODONE (OXY IR/ROXICODONE) 5 MG immediate release tablet Take 1 tablet (5 mg total) by mouth every 6 (six) hours as needed for severe pain. 08/31/16  Yes Nicholas Lose, MD  palbociclib Leslee Home) 125 MG capsule Take 1 capsule (125 mg total) by mouth daily with breakfast. Take whole with food. 08/27/16  Yes Nicholas Lose, MD  pantoprazole sodium (PROTONIX) 40 mg/20 mL PACK Place 20 mLs (40 mg total) into feeding tube daily. 08/27/16  Yes Nicholas Lose, MD  sucralfate (CARAFATE) 1 GM/10ML suspension Take 10 mLs (1 g total) by mouth 4 (four) times daily -  with meals and at bedtime. 08/27/16   Nicholas Lose, MD   No Known Allergies Review of Systems  Constitutional: Positive for activity change, appetite change, fatigue and unexpected weight change.  Gastrointestinal: Positive for abdominal distention, abdominal pain and rectal pain.  Neurological: Positive for weakness.    Psychiatric/Behavioral: The patient is nervous/anxious.     Physical Exam  General: Alert, awake, in no mild distress related to pain.  HEENT: No bruits, no goiter, no JVD Heart: Regular rate and rhythm. No murmur appreciated. Lungs: Good air movement, clear Abdomen: globally tender, distended, positive bowel sounds.  Ext: trace edema Skin: Warm and dry Neuro: Grossly intact, nonfocal.   Vital Signs: BP 122/69 (BP Location: Left Leg)   Pulse (!) 103   Temp 97.9 F (36.6 C) (Oral)   Resp 18   Ht _0  (1.626 m)   Wt 65 kg (143 lb 4.8 oz)   LMP 11/23/2013 Comment: last period was very light, spotting the week before  SpO2 100%   BMI 24.60 kg/m  Pain Assessment: 0-10   Pain Score: 10-Worst pain ever   SpO2: SpO2: 100 % O2 Device:SpO2: 100 % O2 Flow Rate: .O2 Flow Rate (L/min): 1 L/min  IO: Intake/output summary:  Intake/Output Summary (Last 24 hours) at 09/11/16 1011 Last data filed at 09/11/16 0600  Gross per 24 hour  Intake             2280 ml  Output             1900 ml  Net              380 ml    LBM: Last BM Date: 09/09/16 Baseline Weight: Weight: 65 kg (143 lb 4.8 oz) Most recent weight: Weight: 65 kg (143 lb 4.8 oz)     Palliative Assessment/Data:   Flowsheet Rows     Most Recent Value  Intake Tab  Referral Department  Hospitalist  Unit at Time of Referral  Med/Surg Unit  Palliative Care Primary Diagnosis  Cancer  Date Notified  09/09/16  Palliative Care Type  New Palliative care  Reason for referral  Pain  Date of Admission  09/09/16  Date first seen by Palliative Care  09/10/16  # of days Palliative referral response time  1 Day(s)  # of days IP prior to Palliative referral  0  Clinical Assessment  Palliative Performance Scale Score  50%  Pain Max last 24 hours  10  Pain Min Last 24 hours  6  Psychosocial & Spiritual Assessment  Palliative Care Outcomes  Patient/Family meeting held?  Yes  Who was at the meeting?  Patient     Time  Total: 60 Greater than 50%  of this time was spent counseling and coordinating care related to the above assessment and plan.  Signed by: Micheline Rough, MD   Please contact Palliative Medicine Team phone at [phone] for questions and concerns.  For individual provider: See Shea Evans

## 2016-09-11 NOTE — Progress Notes (Signed)
Daily Progress Note   Tamara Name: Tamara Byrd       Date: 09/11/2016 DOB: [birth year]  Age: 51 y.o. MRN#: [id number] Attending Physician: Lavina Hamman, MD Primary Care Physician: Olena Mater, MD Admit Date: 09/09/2016  Reason for Consultation/Follow-up: Establishing goals of care, Non pain symptom management and Pain control  Subjective: I met with Tamara Byrd this evening.  Pain has been better controlled than prior to admission, and she is feeling a little better since paracentesis.  She is disappointed that they were only able to get off 1.6L (was loculated).  While she certainly feels better, she is worried about how quickly fluid and subsequent pain will return.  We also discussed again about MOST and completed today.  Length of Stay: 1  Current Medications: Scheduled Meds:  . fentaNYL  75 mcg Transdermal Q72H  . HYDROmorphone   Intravenous Q4H  . pantoprazole (PROTONIX) IV  40 mg Intravenous QHS  . polyethylene glycol  17 g Oral Daily  . senna-docusate  1 tablet Oral BID    Continuous Infusions: . 0.9 % NaCl with KCl 20 mEq / L 75 mL/hr at 09/11/16 1357    PRN Meds: bisacodyl, diphenhydrAMINE, docusate sodium, HYDROmorphone (DILAUDID) injection, LORazepam, naloxone **AND** sodium chloride flush, ondansetron **OR** ondansetron (ZOFRAN) IV, sodium chloride flush  Physical Exam     General: Alert, awake, in no mild distress related to pain.  HEENT: No bruits, no goiter, no JVD Heart: Regular rate and rhythm. No murmur appreciated. Lungs: Good air movement, clear Abdomen: globally tender, distended, positive bowel sounds.  Ext: trace edema Skin: Warm and dry Neuro: Grossly intact, nonfocal.      Vital Signs: BP 118/60 (BP Location: Left Leg)   Pulse (!) 103    Temp 97.9 F (36.6 C) (Oral)   Resp 18   Ht 5' 4"  (1.626 m)   Wt 65 kg (143 lb 4.8 oz)   LMP 11/23/2013 Comment: last period was very light, spotting the week before  SpO2 100%   BMI 24.60 kg/m  SpO2: SpO2: 100 % O2 Device: O2 Device: Not Delivered O2 Flow Rate: O2 Flow Rate (L/min): 1 L/min  Intake/output summary:  Intake/Output Summary (Last 24 hours) at 09/11/16 1632 Last data filed at 09/11/16 1029  Gross per 24 hour  Intake  1632.5 ml  Output             2000 ml  Net           -367.5 ml   LBM: Last BM Date: 09/09/16 Baseline Weight: Weight: 65 kg (143 lb 4.8 oz) Most recent weight: Weight: 65 kg (143 lb 4.8 oz)       Palliative Assessment/Data:    Flowsheet Rows     Most Recent Value  Intake Tab  Referral Department  Hospitalist  Unit at Time of Referral  Med/Surg Unit  Palliative Care Primary Diagnosis  Cancer  Date Notified  09/09/16  Palliative Care Type  New Palliative care  Reason for referral  Pain  Date of Admission  09/09/16  Date first seen by Palliative Care  09/10/16  # of days Palliative referral response time  1 Day(s)  # of days IP prior to Palliative referral  0  Clinical Assessment  Palliative Performance Scale Score  50%  Pain Max last 24 hours  10  Pain Min Last 24 hours  6  Psychosocial & Spiritual Assessment  Palliative Care Outcomes  Tamara/Family meeting held?  Yes  Who was at the meeting?  Tamara      Tamara Active Problem List   Diagnosis Date Noted  . Pancytopenia ([place]) 09/10/2016  . Intractable abdominal pain 09/09/2016  . Anxiety 09/09/2016  . Malignant ascites 09/09/2016  . Bone metastases ([place]) 08/27/2016  . Metastatic breast cancer ([place]) 07/11/2016  . 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 Depression     Palliative Care Assessment & Plan   Tamara Byrd: 51 y.o. female  with past medical history of metastatic breast cancer, malignant ascites s/p peritoneal catheter placement, depression, anxiety admitted  on 09/09/2016 with uncontrolled abdominal pain.  Palliative consulted for GOC and symptom management.   Recommendations/Plan: We reviewed a MOST form and discussed how to develop plan of care to focus on continuing therapies that would maximize chance of being well enough to return home and limiting therapies not in line with this goal. We completed MOST form today. DNR, Limited additional interventions, IVF and ABX to be determined at time of need, no feeding tube. Pain: Improved slightly following paracentesis, but loculated and only 1.6L off.  Dr. Posey Pronto and I had discussed possibility of PCA and I think that she would be best served by this until we see how quickly she re-accumulates and pain recurs. Ordered standard full dose dilaudid PCA (0.3 bolus with 8 minute lockout).  Code Status:    Code Status Orders        Start     Ordered   09/09/16 2247  Do not attempt resuscitation (DNR)  Continuous    Question Answer Comment  In the event of cardiac or respiratory ARREST Do not call a "code blue"   In the event of cardiac or respiratory ARREST Do not perform Intubation, CPR, defibrillation or ACLS   In the event of cardiac or respiratory ARREST Use medication by any route, position, wound care, and other measures to relive pain and suffering. May use oxygen, suction and manual treatment of airway obstruction as needed for comfort.      09/09/16 2246    Code Status History    Date Active Date Inactive Code Status Order ID Comments User Context   09/09/2016  9:58 PM 09/09/2016 10:46 PM Full Code [id number]  Reubin Milan, MD Inpatient       Prognosis:   Unable to determine  Discharge  Planning:  To Be Determined  Care plan was discussed with patient, RN  Thank you for allowing the Palliative Medicine Team to assist in the care of this Tamara.   Time In: 1350 Time Out: 1435 Total Time 45 Prolonged Time Billed No      Greater than 50%  of this time was spent counseling  and coordinating care related to the above assessment and plan.  Micheline Rough, MD  Please contact Palliative Medicine Team phone at [phone] for questions and concerns.

## 2016-09-12 DIAGNOSIS — T402X5A Adverse effect of other opioids, initial encounter: Secondary | ICD-10-CM

## 2016-09-12 LAB — BASIC METABOLIC PANEL
Anion gap: 8 (ref 5–15)
CALCIUM: 6.3 mg/dL — AB (ref 8.9–10.3)
CHLORIDE: 107 mmol/L (ref 101–111)
CO2: 24 mmol/L (ref 22–32)
CREATININE: 0.43 mg/dL — AB (ref 0.44–1.00)
GFR calc non Af Amer: >60 mL/min (ref >60)
Glucose, Bld: 99 mg/dL (ref 65–99)
Potassium: 3.8 mmol/L (ref 3.5–5.1)
SODIUM: 139 mmol/L (ref 135–145)

## 2016-09-12 LAB — CBC WITH DIFFERENTIAL/PLATELET
BASOS ABS: 0 10*3/uL (ref 0.0–0.1)
BASOS PCT: 0 %
Eosinophils Absolute: 0 10*3/uL (ref 0.0–0.7)
Eosinophils Relative: 1 %
HEMATOCRIT: 24.2 % — AB (ref 36.0–46.0)
Hemoglobin: 7.9 g/dL — ABNORMAL LOW (ref 12.0–15.0)
LYMPHS PCT: 11 %
Lymphs Abs: 0.2 10*3/uL — ABNORMAL LOW (ref 0.7–4.0)
MCH: 30 pg (ref 26.0–34.0)
MCHC: 32.6 g/dL (ref 30.0–36.0)
MCV: 92 fL (ref 78.0–100.0)
MONO ABS: 0.1 10*3/uL (ref 0.1–1.0)
Monocytes Relative: 5 %
NEUTROS ABS: 1.5 10*3/uL — AB (ref 1.7–7.7)
Neutrophils Relative %: 83 %
PLATELETS: 49 10*3/uL — AB (ref 150–400)
RBC: 2.63 MIL/uL — ABNORMAL LOW (ref 3.87–5.11)
RDW: 13.6 % (ref 11.5–15.5)
WBC: 1.8 10*3/uL — AB (ref 4.0–10.5)

## 2016-09-12 LAB — GRAM STAIN: Gram Stain: NONE SEEN

## 2016-09-12 LAB — MAGNESIUM: Magnesium: 1.8 mg/dL (ref 1.7–2.4)

## 2016-09-12 MED ORDER — VANCOMYCIN HCL IN DEXTROSE 750-5 MG/150ML-% IV SOLN
750.0000 mg | Freq: Two times a day (BID) | INTRAVENOUS | Status: DC
  Filled 2016-09-12 (×2): qty 150

## 2016-09-12 MED ORDER — DEXTROSE 5 % IV SOLN
2.0000 g | Freq: Two times a day (BID) | INTRAVENOUS | Status: DC
  Filled 2016-09-12: qty 2

## 2016-09-12 MED ORDER — SODIUM CHLORIDE 0.9 % IV SOLN
1.0000 g | Freq: Once | INTRAVENOUS | Status: AC
  Administered 2016-09-12: 1 g via INTRAVENOUS
  Filled 2016-09-12: qty 10

## 2016-09-12 MED ORDER — FENTANYL 75 MCG/HR TD PT72
150.0000 ug | MEDICATED_PATCH | TRANSDERMAL | Status: DC
  Administered 2016-09-13 – 2016-09-15 (×2): 150 ug via TRANSDERMAL
  Filled 2016-09-12 (×2): qty 2

## 2016-09-12 MED ORDER — METHYLNALTREXONE BROMIDE 12 MG/0.6ML ~~LOC~~ SOLN
12.0000 mg | Freq: Once | SUBCUTANEOUS | Status: AC
  Administered 2016-09-12: 12 mg via SUBCUTANEOUS
  Filled 2016-09-12: qty 0.6

## 2016-09-12 MED ORDER — VANCOMYCIN HCL IN DEXTROSE 1-5 GM/200ML-% IV SOLN
1000.0000 mg | Freq: Once | INTRAVENOUS | Status: AC
  Administered 2016-09-12: 1000 mg via INTRAVENOUS
  Filled 2016-09-12: qty 200

## 2016-09-12 MED ORDER — DEXTROSE 5 % IV SOLN
2.0000 g | Freq: Two times a day (BID) | INTRAVENOUS | Status: DC
  Administered 2016-09-13 – 2016-09-14 (×3): 2 g via INTRAVENOUS
  Filled 2016-09-12 (×3): qty 2

## 2016-09-12 MED ORDER — DEXTROSE 5 % IV SOLN
2.0000 g | Freq: Once | INTRAVENOUS | Status: AC
  Administered 2016-09-12: 2 g via INTRAVENOUS
  Filled 2016-09-12: qty 2

## 2016-09-12 MED ORDER — HYDROCORTISONE 2.5 % RE CREA
TOPICAL_CREAM | Freq: Two times a day (BID) | RECTAL | Status: DC
  Administered 2016-09-12: 22:00:00 via RECTAL
  Administered 2016-09-13: 1 via RECTAL
  Administered 2016-09-13 – 2016-09-16 (×4): via RECTAL
  Filled 2016-09-12 (×2): qty 28.35

## 2016-09-12 MED ORDER — MAGNESIUM HYDROXIDE 400 MG/5ML PO SUSP
15.0000 mL | Freq: Every day | ORAL | Status: DC
  Administered 2016-09-12: 15 mL via ORAL
  Filled 2016-09-12: qty 30

## 2016-09-12 NOTE — Progress Notes (Signed)
[facility] Progress Note  Patient: Tamara Byrd TGG:[id number]   PCP: Olena Mater, MD DOB: 06-12-1965   DOA: 09/09/2016   DOS: 09/12/2016   Date of Service: the patient was seen and examined on 09/12/2016  Subjective: Abdominal pain was actually worse this morning. No nausea no vomiting no fever no chills. No diarrhea and actually constipated. Passing gas. Tolerating oral diet  Brief hospital course: Pt. with PMH of Metastatic breast cancer with recurrence, malignant ascites and S/P peritoneal catheter placement, depression, anxiety; admitted on 09/09/2016, presented with complaint of severe abdominal pain, was found to have abdominal pain related to cancer uncontrolled. Currently further plan is continue pain regimen control.  Assessment and Plan: 1. Intractable abdominal pain. Abdominal metastasis with malignant ascites.  Increase Duragesic patch to 75 g every 72 hours. Hydromorphone 1 mg IVPB every 30 minutes as needed. Antiemetics as needed. Her free air could also represent air from the catheter versus contain bowel perforation although the patient would not be a good candidate for any surgical evaluation due to her metastatic disease. Underwent paracentesis today which improved her symptoms but due to loculation or IR the peritoneal drainage catheter most likely not be effective in the long run and ideally should be removed due to its potential for infection Palliative medicine consulted. Appreciate input. Currently on PCA for pain control  SBP. Peritoneal fluid analysis shows increased WBC neutrophil predominance. LDH elevated. Gram-positive cocci in clusters on Gram stain. We'll start the patient on IV vancomycin and IV cefepime to cover her empirically. Await culture clearance. Patient will need the removal of the drainage catheter.  Active Problems:   Metastatic breast cancer [facility]) The patient is currently getting palliative chemotherapy. Will have to stop  Ibrance due to abdominal pain as well as thrombocytopenia Palliative care consulted primarily for pain management as well as assistance with goals of care.    Depression Hold Wellbutrin. Anxiety control.    Anxiety Will start lorazepam 0.5 mg IVP every 2 hours as needed.    Malignant ascites Per patient, he has not been able to drain. S/P paracentesis 1. Significantly loculated effusion therefore permanent catheter will not be a solution for the patient. It is actually a source of infection for patient currently suspected SBP and therefore it has to be removed.    Pancytopenia ([place]) Secondary to chemotherapy. Currently holding chemotherapy for the same. Monitor daily CBC.  Diet: full liquid diet DVT Prophylaxis: mechanical compression device  Advance goals of care discussion: DNR DNI  Family Communication: family was present at bedside, at the time of interview.   Disposition:  Discharge to home. Likely Tuesday.  Consultants: Oncology, general surgery, palliative medicine, interventional radiology Procedures: PICC line placement due to poor IV access, ultrasound paracentesis  Antibiotics: Anti-infectives    Start     Dose/Rate Route Frequency Ordered Stop   09/13/16 2200  vancomycin (VANCOCIN) IVPB 750 mg/150 ml premix     750 mg 150 mL/hr over 60 Minutes Intravenous Every 12 hours 09/12/16 1257     09/12/16 2200  ceFEPIme (MAXIPIME) 2 g in dextrose 5 % 50 mL IVPB     2 g 100 mL/hr over 30 Minutes Intravenous Every 12 hours 09/12/16 1146     09/12/16 1300  vancomycin (VANCOCIN) IVPB 1000 mg/200 mL premix     1,000 mg 200 mL/hr over 60 Minutes Intravenous  Once 09/12/16 1257 09/12/16 1543   09/12/16 1200  ceFEPIme (MAXIPIME) 2 g in dextrose 5 % 50 mL IVPB  2 g 100 mL/hr over 30 Minutes Intravenous  Once 09/12/16 1134 09/12/16 1704       Objective: Physical Exam: Vitals:   09/12/16 0510 09/12/16 1200 09/12/16 1518 09/12/16 1643  BP: (!) 108/43  129/62     Pulse: 98  (!) 105   Resp: 16 16 15 16   Temp: 98.2 F (36.8 C)  99 F (37.2 C)   TempSrc: Oral  Oral   SpO2: 96% 96% 95% 96%  Weight:      Height:        Intake/Output Summary (Last 24 hours) at 09/12/16 1708 Last data filed at 09/12/16 1700  Gross per 24 hour  Intake             2090 ml  Output             2650 ml  Net             -560 ml   Filed Weights   09/10/16 0618  Weight: 65 kg (143 lb 4.8 oz)   General: Alert, Awake and Oriented to Time, Place and Person. Appear in moderate distress, affect appropriate Eyes: PERRL, Conjunctiva normal ENT: Oral Mucosa clear moist. Neck: no JVD, no Abnormal Mass Or lumps Cardiovascular: S1 and S2 Present, no Murmur, Peripheral Pulses Present Respiratory: normal respiratory effort, Bilateral Air entry equal and Decreased, no use of accessory muscle, Clear to Auscultation, no Crackles, no wheezes Abdomen: Bowel Sound present, Soft and moderate diffuse tenderness, skin: no redness, no Rash, no induration Extremities: trace Pedal edema, no calf tenderness Neurologic: Grossly no focal neuro deficit. Bilaterally Equal motor strength  Data Reviewed: CBC:  Recent Labs Lab 09/09/16 1413 09/10/16 0517 09/11/16 1225 09/12/16 0515  WBC 3.1* 2.8* 2.3* 1.8*  NEUTROABS  --   --  2.0 1.5*  HGB 10.1* 9.7* 8.6* 7.9*  HCT 30.5* 29.6* 26.1* 24.2*  MCV 92.1 91.6 91.6 92.0  PLT 82* 72* 60* 49*   Basic Metabolic Panel:  Recent Labs Lab 09/09/16 1413 09/10/16 0517 09/11/16 1225 09/12/16 0515  NA 140 137 138 139  K 3.5 3.3* 3.8 3.8  CL 107 105 106 107  CO2 24 23 24 24   GLUCOSE 88 94 85 99  BUN 6 <5* <5* <5*  CREATININE 0.46 0.51 0.42* 0.43*  CALCIUM 7.0* 6.8* 6.6* 6.3*  MG  --   --   --  1.8    Liver Function Tests:  Recent Labs Lab 09/09/16 1413 09/11/16 1225  AST 23 14*  ALT 19 13*  ALKPHOS 81 80  BILITOT 0.5 0.6  PROT 4.7* 4.5*  ALBUMIN 1.3* 1.2*    Recent Labs Lab 09/09/16 1413  LIPASE 16   No results for  input(s): AMMONIA in the last 168 hours. Coagulation Profile: No results for input(s): INR, PROTIME in the last 168 hours. Cardiac Enzymes: No results for input(s): CKTOTAL, CKMB, CKMBINDEX, TROPONINI in the last 168 hours. BNP (last 3 results) No results for input(s): PROBNP in the last 8760 hours. CBG: No results for input(s): GLUCAP in the last 168 hours. Studies: No results found.  Scheduled Meds: . fentaNYL  75 mcg Transdermal Q72H  . hydrocortisone   Rectal BID  . HYDROmorphone   Intravenous Q4H  . magnesium hydroxide  15 mL Oral Daily  . pantoprazole (PROTONIX) IV  40 mg Intravenous QHS  . polyethylene glycol  17 g Oral Daily  . senna-docusate  1 tablet Oral BID   Continuous Infusions: . 0.9 % NaCl with KCl  20 mEq / L 75 mL/hr at 09/11/16 1357  . ceFEPime (MAXIPIME) IV    . [START ON 09/13/2016] vancomycin     PRN Meds: bisacodyl, diphenhydrAMINE, docusate sodium, HYDROmorphone (DILAUDID) injection, LORazepam, naloxone **AND** sodium chloride flush, ondansetron **OR** ondansetron (ZOFRAN) IV, sodium chloride flush  Time spent: 35 minutes  Author: Berle Mull, MD [facility] Pager: [phone] 09/12/2016 5:08 PM  If 7PM-7AM, please contact night-coverage at [email or web address], password [facility]

## 2016-09-12 NOTE — Progress Notes (Signed)
Dr. Posey Pronto aware verbally upon rounds to unit of report from [facility] micro lab as noted earlier. See MD's new orders.

## 2016-09-12 NOTE — Progress Notes (Signed)
Pharmacy Antibiotic Note  Tamara Byrd is a 51 y.o. female with breast cancer with mets to abdomen and peritoneal drain for malignant ascites, admitted on 09/09/2016 for abdominal pain. Peritoneal fluid growing GPCs in clusters in 2/2 bottles. Not currently neutropenic but MD expects [place] to continue Pharmacy has been consulted for vancomycin and cefepime dosing.  Plan:  Vancomycin 1000 mg IV now, then 750 mg IV q12 hr; goal trough 15-20 mcg/mL  Measure vancomycin trough levels at steady state as indicated  F/u peritoneal Cx for narrowing vancomycin  Cefepime 2 g IV q12 hr   Height: 5\' 4"  (162.6 cm) Weight: 143 lb 4.8 oz (65 kg) IBW/kg (Calculated) : 54.7  Temp (24hrs), Avg:98.4 F (36.9 C), Min:98.2 F (36.8 C), Max:98.6 F (37 C)   Recent Labs Lab 09/09/16 1413 09/10/16 0517 09/11/16 1225 09/12/16 0515  WBC 3.1* 2.8* 2.3* 1.8*  CREATININE 0.46 0.51 0.42* 0.43*    Estimated Creatinine Clearance: 71 mL/min (A) (by C-G formula based on SCr of 0.43 mg/dL (L)).    No Known Allergies  Antimicrobials this admission: 8/19 cefepime >>  8/19 vancomycin >>   Dose adjustments this admission: ---  Microbiology results: 8/18: Peritoneal washings: GPCs in clusters, aerobic and anaerobic bottles  Thank you for allowing pharmacy to be a part of this patient's care.  Reuel Boom, PharmD, BCPS Pager: [phone] 09/12/2016, 12:58 PM

## 2016-09-12 NOTE — Progress Notes (Signed)
Subjective/Chief Complaint: Pt had paracentesis yesterday for 1.6 L, but was difficult and areas were loculated.      Objective: Vital signs in last 24 hours: Temp:  [98.2 F (36.8 C)-98.6 F (37 C)] 98.2 F (36.8 C) (08/19 0510) Pulse Rate:  [98-104] 98 (08/19 0510) Resp:  [16-28] 16 (08/19 0510) BP: (108-125)/(43-64) 108/43 (08/19 0510) SpO2:  [92 %-100 %] 96 % (08/19 0510) Last BM Date: 09/09/16  Intake/Output from previous day: 08/18 0701 - 08/19 0700 In: 2040 [P.O.:240; I.V.:1800] Out: 1950 [Urine:1950] Intake/Output this shift: No intake/output data recorded.  General appearance: alert, cooperative and no distress.  Ambulating.  Cardio: regular rate and rhythm GI: soft,  Much less distended, LLQ tenderness.  no r/g. Extremities: extremities normal, atraumatic, no cyanosis or edema  Lab Results:   Recent Labs  09/11/16 1225 09/12/16 0515  WBC 2.3* 1.8*  HGB 8.6* 7.9*  HCT 26.1* 24.2*  PLT 60* 49*   BMET  Recent Labs  09/11/16 1225 09/12/16 0515  NA 138 139  K 3.8 3.8  CL 106 107  CO2 24 24  GLUCOSE 85 99  BUN <5* <5*  CREATININE 0.42* 0.43*  CALCIUM 6.6* 6.3*   PT/INR No results for input(s): LABPROT, INR in the last 72 hours. ABG No results for input(s): PHART, HCO3 in the last 72 hours.  Invalid input(s): PCO2, PO2  Studies/Results: US Paracentesis  Result Date: 09/11/2016 INDICATION: History of metastatic breast cancer with metastatic tumor in the pelvis. Recurrent abdominal ascites. Patient had a PleurX catheter placed [place] about 6 weeks ago. Unfortunately, this is no longer working. Request is made today for evaluation for a paracentesis since her catheter is not working. EXAM: ULTRASOUND GUIDED DIAGNOSTIC AND THERAPEUTIC PARACENTESIS MEDICATIONS: 2% xylocaine with epinephrine COMPLICATIONS: None immediate. PROCEDURE: Informed written consent was obtained from the patient after a discussion of the risks, benefits and alternatives to  treatment. A timeout was performed prior to the initiation of the procedure. Initial ultrasound scanning demonstrates a small amount of loculated ascites within the left upper abdominal quadrant. The left upper abdomen was prepped and draped in the usual sterile fashion. 2% xylocaine with epinephrine was used for local anesthesia. Following this, a 19 gauge, 7-cm, Yueh catheter was introduced. An ultrasound image was saved for documentation purposes. The paracentesis was performed. The catheter was removed and a dressing was applied. The patient tolerated the procedure well without immediate post procedural complication. FINDINGS: A total of approximately 1.6 L of serous fluid was removed. Samples were sent to the laboratory as requested by the clinical team. The patient was noted to have significant complex ascites noted throughout her abdomen. This was most densely loculated within the pelvis. IMPRESSION: Successful ultrasound-guided paracentesis yielding 1.6 liters of peritoneal fluid. Complex ascites with significant loculations, specifically within the pelvis. PleurX catheter is unlikely to ever work given complexity of fluid. Recommendation would be for removal. This was discussed with Dr. Earleen Newport as well as Dr. Posey Pronto. Read by: Saverio Danker, PA-C Electronically Signed   By: Corrie Mckusick D.O.   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   Air in RUQ over liver. Catheter appears to be positioned superiorly and most ascites appears to be inferior.   Primary team has consulted IR to see if this can be manipulated.  I do think air in RUQ  is secondary to drain manipulation. Does not appear clinically to have bowel perforation. Decreased BMs are likely secondary to constipation given stool burden in right colon and narcotic requirement from bone  mets.  Agree with advance diet as tolerated Not clear that current drain is doing anything.  Would discuss with IR and oncology whether this can be removed.    Will sign off.  Call for further issues.     LOS: 2 days    [facility] 09/12/2016

## 2016-09-12 NOTE — Progress Notes (Signed)
Daily Progress Note   Patient Name: Tamara Byrd       Date: 09/12/2016 DOB: [birth year]  Age: 51 y.o. MRN#: [id number] Attending Physician: Lavina Hamman, MD Primary Care Physician: Tamara Mater, MD Admit Date: 09/09/2016  Reason for Consultation/Follow-up: Establishing goals of care, Non pain symptom management and Pain control  Subjective: I met with Tamara Byrd this evening.  She reports that pain has been better controlled on PCA.    Still passing gas but no bowel movement.  We discussed strategies for constipation including continuation of traditional laxative therapies vs. Trial of relistor.  Discussed risk vs benefit and she would like to proceed with relistor.  Length of Stay: 2  Current Medications: Scheduled Meds:  . [START ON 09/13/2016] fentaNYL  150 mcg Transdermal Q72H  . hydrocortisone   Rectal BID  . HYDROmorphone   Intravenous Q4H  . methylnaltrexone  12 mg Subcutaneous Once  . pantoprazole (PROTONIX) IV  40 mg Intravenous QHS    Continuous Infusions: . 0.9 % NaCl with KCl 20 mEq / L 75 mL/hr at 09/11/16 1357  . [START ON 09/13/2016] ceFEPime (MAXIPIME) IV    . [START ON 09/13/2016] vancomycin      PRN Meds: diphenhydrAMINE, HYDROmorphone (DILAUDID) injection, LORazepam, naloxone **AND** sodium chloride flush, ondansetron **OR** ondansetron (ZOFRAN) IV, sodium chloride flush  Physical Exam     General: Alert, awake, in no mild distress related to pain.  HEENT: No bruits, no goiter, no JVD Heart: Regular rate and rhythm. No murmur appreciated. Lungs: Good air movement, clear Abdomen: globally tender, distended, positive bowel sounds.  Ext: trace edema Skin: Warm and dry Neuro: Grossly intact, nonfocal.      Vital Signs: BP 129/62 (BP Location: Right Arm)    Pulse (!) 105   Temp 99 F (37.2 C) (Oral)   Resp 16   Ht _0  (1.626 m)   Wt 65 kg (143 lb 4.8 oz)   LMP 11/23/2013 Comment: last period was very light, spotting the week before  SpO2 96%   BMI 24.60 kg/m  SpO2: SpO2: 96 % O2 Device: O2 Device: Not Delivered O2 Flow Rate: O2 Flow Rate (L/min): 1 L/min  Intake/output summary:   Intake/Output Summary (Last 24 hours) at 09/12/16 1848 Last data filed at 09/12/16 1717  Gross per 24 hour  Intake          2443.75 ml  Output             2500 ml  Net           -56.25 ml   LBM: Last BM Date: 09/09/16 Baseline Weight: Weight: 65 kg (143 lb 4.8 oz) Most recent weight: Weight: 65 kg (143 lb 4.8 oz)       Palliative Assessment/Data:    Flowsheet Rows     Most Recent Value  Intake Tab  Referral Department  Hospitalist  Unit at Time of Referral  Med/Surg Unit  Palliative Care Primary Diagnosis  Cancer  Date Notified  09/09/16  Palliative Care Type  New Palliative care  Reason for referral  Pain  Date of Admission  09/09/16  Date first seen by Palliative Care  09/10/16  # of days Palliative referral response time  1 Day(s)  # of days IP prior to Palliative referral  0  Clinical Assessment  Palliative Performance Scale Score  50%  Pain Max last 24 hours  10  Pain Min Last 24 hours  6  Psychosocial & Spiritual Assessment  Palliative Care Outcomes  Patient/Family meeting held?  Yes  Who was at the meeting?  Patient      Patient Active Problem List   Diagnosis Date Noted  . Pancytopenia ([place]) 09/10/2016  . Intractable abdominal pain 09/09/2016  . Anxiety 09/09/2016  . Malignant ascites 09/09/2016  . Bone metastases ([place]) 08/27/2016  . Metastatic breast cancer ([place]) 07/11/2016  . 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 Depression     Palliative Care Assessment & Plan   Patient Profile: 51 y.o. female  with past medical history of metastatic breast cancer, malignant ascites s/p peritoneal catheter placement, depression,  anxiety admitted on 09/09/2016 with uncontrolled abdominal pain.  Palliative consulted for GOC and symptom management.   Recommendations/Plan: Pain: PCA data from last 24 hours reviewed.  She reports that pain has been better controlled since starting PCA.  She has used 15.71m of dilaudid in the last 24 hours.  Oral morphine equivalent is 30110m  She is due for fentanyl patch change today.  Plan to increase patch by 7573mto 150m30mr.  Continue PCA overnight and reassess in AM. Constipation: Has failed laxative therapies.  Discussed with Dr. PatePosey Pronto Dr. ByerBarry Dienesay.  Plan for trial of methylnaltrexone.  Code Status:    Code Status Orders        Start     Ordered   09/09/16 2247  Do not attempt resuscitation (DNR)  Continuous    Question Answer Comment  In the event of cardiac or respiratory ARREST Do not call a "code blue"   In the event of cardiac or respiratory ARREST Do not perform Intubation, CPR, defibrillation or ACLS   In the event of cardiac or respiratory ARREST Use medication by any route, position, wound care, and other measures to relive pain and suffering. May use oxygen, suction and manual treatment of airway obstruction as needed for comfort.      09/09/16 2246    Code Status History    Date Active Date Inactive Code Status Order ID Comments User Context   09/09/2016  9:58 PM 09/09/2016 10:46 PM Full Code [id number] Milan Inpatient       Prognosis:   Unable to determine  Discharge Planning:  To Be Determined  Care plan was discussed with patient, RN  Thank you for allowing the Palliative  Medicine Team to assist in the care of this patient.   Time In: 1800 Time Out: 1845 Total Time 45 Prolonged Time Billed No      Greater than 50%  of this time was spent counseling and coordinating care related to the above assessment and plan.  Micheline Rough, MD  Please contact Palliative Medicine Team phone at [phone] for questions and concerns.

## 2016-09-12 NOTE — Progress Notes (Signed)
CRITICAL VALUE ALERT  Critical Value:  Calcium 6.3  Date & Time Notied:  8/19 0625  Provider Notified:T Opyd, MD  Orders Received/Actions taken:  Calcium gluconate IV

## 2016-09-12 NOTE — Progress Notes (Signed)
Micro lab at [facility] called to report to RN that body fluid cultures growing gram positive cocci.

## 2016-09-13 ENCOUNTER — Inpatient Hospital Stay (HOSPITAL_COMMUNITY): Payer: BLUE CROSS/BLUE SHIELD

## 2016-09-13 ENCOUNTER — Other Ambulatory Visit: Payer: Managed Care, Other (non HMO)

## 2016-09-13 ENCOUNTER — Ambulatory Visit: Payer: BLUE CROSS/BLUE SHIELD | Admitting: Hematology and Oncology

## 2016-09-13 ENCOUNTER — Encounter (HOSPITAL_COMMUNITY): Payer: Self-pay | Admitting: Diagnostic Radiology

## 2016-09-13 ENCOUNTER — Ambulatory Visit: Payer: Self-pay | Admitting: Oncology

## 2016-09-13 ENCOUNTER — Other Ambulatory Visit: Payer: BLUE CROSS/BLUE SHIELD

## 2016-09-13 ENCOUNTER — Ambulatory Visit: Payer: BLUE CROSS/BLUE SHIELD

## 2016-09-13 ENCOUNTER — Encounter: Admit: 2016-09-13 | Discharge: 2016-09-14

## 2016-09-13 DIAGNOSIS — R69 Illness, unspecified: Principal | ICD-10-CM

## 2016-09-13 DIAGNOSIS — C50919 Malignant neoplasm of unspecified site of unspecified female breast: Secondary | ICD-10-CM

## 2016-09-13 DIAGNOSIS — R188 Other ascites: Secondary | ICD-10-CM

## 2016-09-13 DIAGNOSIS — R109 Unspecified abdominal pain: Secondary | ICD-10-CM

## 2016-09-13 DIAGNOSIS — F419 Anxiety disorder, unspecified: Secondary | ICD-10-CM

## 2016-09-13 DIAGNOSIS — K5903 Drug induced constipation: Secondary | ICD-10-CM

## 2016-09-13 DIAGNOSIS — R180 Malignant ascites: Secondary | ICD-10-CM

## 2016-09-13 HISTORY — PX: IR REMOVAL PERM PERITONEAL CATH: IMG2329

## 2016-09-13 LAB — CBC WITH DIFFERENTIAL/PLATELET
BASOS PCT: 0 %
Basophils Absolute: 0 10*3/uL (ref 0.0–0.1)
EOS ABS: 0 10*3/uL (ref 0.0–0.7)
EOS PCT: 1 %
HCT: 24.3 % — ABNORMAL LOW (ref 36.0–46.0)
Hemoglobin: 7.9 g/dL — ABNORMAL LOW (ref 12.0–15.0)
Lymphocytes Relative: 13 %
Lymphs Abs: 0.2 10*3/uL — ABNORMAL LOW (ref 0.7–4.0)
MCH: 29.8 pg (ref 26.0–34.0)
MCHC: 32.5 g/dL (ref 30.0–36.0)
MCV: 91.7 fL (ref 78.0–100.0)
MONOS PCT: 6 %
Monocytes Absolute: 0.1 10*3/uL (ref 0.1–1.0)
Neutro Abs: 1.4 10*3/uL — ABNORMAL LOW (ref 1.7–7.7)
Neutrophils Relative %: 81 %
PLATELETS: 45 10*3/uL — AB (ref 150–400)
RBC: 2.65 MIL/uL — AB (ref 3.87–5.11)
RDW: 13.7 % (ref 11.5–15.5)
WBC: 1.8 10*3/uL — ABNORMAL LOW (ref 4.0–10.5)

## 2016-09-13 LAB — BASIC METABOLIC PANEL
Anion gap: 8 (ref 5–15)
CO2: 24 mmol/L (ref 22–32)
CREATININE: 0.44 mg/dL (ref 0.44–1.00)
Calcium: 6.4 mg/dL — CL (ref 8.9–10.3)
Chloride: 105 mmol/L (ref 101–111)
GFR calc Af Amer: >60 mL/min (ref >60)
Glucose, Bld: 109 mg/dL — ABNORMAL HIGH (ref 65–99)
POTASSIUM: 3.6 mmol/L (ref 3.5–5.1)
SODIUM: 137 mmol/L (ref 135–145)

## 2016-09-13 LAB — PATHOLOGIST SMEAR REVIEW

## 2016-09-13 MED ORDER — LIDOCAINE-EPINEPHRINE (PF) 2 %-1:200000 IJ SOLN
INTRAMUSCULAR | Status: AC
  Filled 2016-09-13: qty 20

## 2016-09-13 MED ORDER — POLYETHYLENE GLYCOL 3350 17 G PO PACK
17.0000 g | PACK | Freq: Every day | ORAL | Status: DC
  Administered 2016-09-14 – 2016-09-16 (×3): 17 g via ORAL
  Filled 2016-09-13 (×3): qty 1

## 2016-09-13 MED ORDER — SENNA 8.6 MG PO TABS
2.0000 | ORAL_TABLET | Freq: Two times a day (BID) | ORAL | Status: DC
  Administered 2016-09-13 – 2016-09-16 (×6): 17.2 mg via ORAL
  Filled 2016-09-13 (×6): qty 2

## 2016-09-13 MED ORDER — LORAZEPAM 0.5 MG PO TABS
0.5000 mg | ORAL_TABLET | Freq: Four times a day (QID) | ORAL | Status: DC | PRN
  Administered 2016-09-13 – 2016-09-16 (×4): 0.5 mg via ORAL
  Filled 2016-09-13 (×4): qty 1

## 2016-09-13 MED ORDER — SODIUM CHLORIDE 0.9 % IV SOLN
2.0000 g | Freq: Once | INTRAVENOUS | Status: AC
  Administered 2016-09-13: 2 g via INTRAVENOUS
  Filled 2016-09-13: qty 20

## 2016-09-13 MED ORDER — LIDOCAINE-EPINEPHRINE (PF) 2 %-1:200000 IJ SOLN
INTRAMUSCULAR | Status: DC | PRN
  Administered 2016-09-13: 10 mL via INTRADERMAL

## 2016-09-13 MED ORDER — BUPROPION HCL ER (XL) 150 MG PO TB24
150.0000 mg | ORAL_TABLET | Freq: Every day | ORAL | Status: DC
  Administered 2016-09-14 – 2016-09-16 (×3): 150 mg via ORAL
  Filled 2016-09-13 (×4): qty 1

## 2016-09-13 NOTE — Procedures (Signed)
Peritoneal catheter is no longer functioning well due to loculated ascites.  The peritoneal catheter was removed without complication.  Minimal blood loss.

## 2016-09-13 NOTE — Progress Notes (Signed)
CRITICAL VALUE ALERT  Critical Value:  Calcium 6.4  Date & Time Notied:  8/20 0654  Provider Notified: Lamar Blinks, NP  Orders Received/Actions taken: Calcium gluconate iv

## 2016-09-13 NOTE — Progress Notes (Signed)
Daily Progress Note   Patient Name: Tamara Byrd       Date: 09/13/2016 DOB: [birth year]  Age: 51 y.o. MRN#: [id number] Attending Physician: Lavina Hamman, MD Primary Care Physician: Olena Mater, MD Admit Date: 09/09/2016  Reason for Consultation/Follow-up: Establishing goals of care, Non pain symptom management and Pain control  Subjective: I met with [name] this evening.  She reports that pain has been well controlled on PCA.    Had BM following relistor.  Length of Stay: 3  Current Medications: Scheduled Meds:  . buPROPion  150 mg Oral Daily  . fentaNYL  150 mcg Transdermal Q72H  . hydrocortisone   Rectal BID  . HYDROmorphone   Intravenous Q4H  . lidocaine-EPINEPHrine      . pantoprazole (PROTONIX) IV  40 mg Intravenous QHS  . [START ON 09/14/2016] polyethylene glycol  17 g Oral Daily  . senna  2 tablet Oral BID    Continuous Infusions: . 0.9 % NaCl with KCl 20 mEq / L 1,000 mL (09/13/16 1958)  . ceFEPime (MAXIPIME) IV Stopped (09/13/16 1728)  . vancomycin      PRN Meds: diphenhydrAMINE, HYDROmorphone (DILAUDID) injection, lidocaine-EPINEPHrine, LORazepam, naloxone **AND** sodium chloride flush, ondansetron **OR** ondansetron (ZOFRAN) IV, sodium chloride flush  Physical Exam     General: Alert, awake, in no mild distress related to pain.  HEENT: No bruits, no goiter, no JVD Heart: Regular rate and rhythm. No murmur appreciated. Lungs: Good air movement, clear Abdomen: globally tender, distended, positive bowel sounds.  Ext: trace edema Skin: Warm and dry Neuro: Grossly intact, nonfocal.      Vital Signs: BP 130/70 (BP Location: Left Leg)   Pulse 90   Temp 98 F (36.7 C) (Oral)   Resp 16   Ht 5' 4"  (1.626 m)   Wt 65 kg (143 lb 4.8 oz)   LMP 11/23/2013  Comment: last period was very light, spotting the week before  SpO2 100%   BMI 24.60 kg/m  SpO2: SpO2: 100 % O2 Device: O2 Device: Not Delivered O2 Flow Rate: O2 Flow Rate (L/min): 2 L/min  Intake/output summary:   Intake/Output Summary (Last 24 hours) at 09/13/16 2134 Last data filed at 09/13/16 2127  Gross per 24 hour  Intake             2650  ml  Output              750 ml  Net             1900 ml   LBM: Last BM Date: 09/12/16 Baseline Weight: Weight: 65 kg (143 lb 4.8 oz) Most recent weight: Weight: 65 kg (143 lb 4.8 oz)       Palliative Assessment/Data:    Flowsheet Rows     Most Recent Value  Intake Tab  Referral Department  Hospitalist  Unit at Time of Referral  Med/Surg Unit  Palliative Care Primary Diagnosis  Cancer  Date Notified  09/09/16  Palliative Care Type  New Palliative care  Reason for referral  Pain  Date of Admission  09/09/16  Date first seen by Palliative Care  09/10/16  # of days Palliative referral response time  1 Day(s)  # of days IP prior to Palliative referral  0  Clinical Assessment  Palliative Performance Scale Score  50%  Pain Max last 24 hours  10  Pain Min Last 24 hours  6  Psychosocial & Spiritual Assessment  Palliative Care Outcomes  Patient/Family meeting held?  Yes  Who was at the meeting?  Patient      Patient Active Problem List   Diagnosis Date Noted  . Pancytopenia ([place]) 09/10/2016  . Intractable abdominal pain 09/09/2016  . Anxiety 09/09/2016  . Malignant ascites 09/09/2016  . Bone metastases ([place]) 08/27/2016  . Metastatic breast cancer ([place]) 07/11/2016  . 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 Depression     Palliative Care Assessment & Plan   Patient Profile: 51 y.o. female  with past medical history of metastatic breast cancer, malignant ascites s/p peritoneal catheter placement, depression, anxiety admitted on 09/09/2016 with uncontrolled abdominal pain.  Palliative consulted for GOC and symptom management.    Recommendations/Plan: Pain: PCA data from last 24 hours reviewed.  She reports that pain has been better controlled since starting PCA.  Usage dropping since increasing patch to 121mg.  Continue same overnight and plan to convert to oral rescue in AM. Constipation: Good effect with Relistor.  Added back senna and miralax today.  Code Status:    Code Status Orders        Start     Ordered   09/09/16 2247  Do not attempt resuscitation (DNR)  Continuous    Question Answer Comment  In the event of cardiac or respiratory ARREST Do not call a "code blue"   In the event of cardiac or respiratory ARREST Do not perform Intubation, CPR, defibrillation or ACLS   In the event of cardiac or respiratory ARREST Use medication by any route, position, wound care, and other measures to relive pain and suffering. May use oxygen, suction and manual treatment of airway obstruction as needed for comfort.      09/09/16 2246    Code Status History    Date Active Date Inactive Code Status Order ID Comments User Context   09/09/2016  9:58 PM 09/09/2016 10:46 PM Full Code [id number] OReubin Milan MD Inpatient       Prognosis:   Unable to determine  Discharge Planning:  To Be Determined  Care plan was discussed with patient, RN  Thank you for allowing the Palliative Medicine Team to assist in the care of this patient.   Total Time 30 Prolonged Time Billed No      Greater than 50%  of this time was spent counseling and coordinating care related to  the above assessment and plan.  Micheline Rough, MD  Please contact Palliative Medicine Team phone at [phone] for questions and concerns.

## 2016-09-13 NOTE — Consult Note (Signed)
Chief Complaint: Patient was seen in consultation today for breat cancer, malignant ascites  Referring Physician(s):  Dr. Berle Mull  Supervising Physician: Markus Daft  Patient Status: [facility] - Out-pt  History of Present Illness: Tamara Byrd is a 51 y.o. female with past medical history of breast cancer and malignant ascites who is s/p abdominal Pleurx catheter placement at OSH in June 2018. She presents to [facility] ED with abdominal pain and constipation.  Her pleurx catheter is also non-functioning.  She reports she last got 200 mL out of her drain earlier this week.   CT Abd/Pelvis 09/09/16: 1. Findings are highly concerning for contained perforated loop of distal small bowel (presumably ileum) in the right upper quadrant immediately anterior to the liver, as detailed above. Surgical consultation is recommended. 2. 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 3. Large volume of presumably malignant ascites. 4. Small right and trace left pleural effusions with areas of passive atelectasis in the lung bases bilaterally.  Patient underwent US guided paracentesis 09/12/16 which successfully removed 1.6 liters of complex fluid.   IR consulted for evaluation of Pleurx catheter.   Past Medical History:  Diagnosis Date  . Breast cancer ([place])   . Depression   . Medical history non-contributory     Past Surgical History:  Procedure Laterality Date  . ABDOMINOPLASTY  1998  . BREAST REDUCTION SURGERY  1998  . CESAREAN SECTION     x 2  . CYST EXCISION  2013   from wrist  . LAPAROSCOPY N/A 05/01/2012   Procedure:  LAPAROSCOPY OPERATIVE with right  salpingectomy;  Surgeon: Azalia Bilis, MD;  Location: [facility];  Service: Gynecology;  Laterality: N/A;  . SALPINGOOPHORECTOMY Left 05/01/2012   Procedure: SALPINGO OOPHORECTOMY ;  Surgeon: Azalia Bilis, MD;  Location: [place] [facility];  Service: Gynecology;  Laterality: Left;  . TUBAL LIGATION  1994    Allergies: Patient has no known allergies.  Medications: Prior to Admission medications   Medication Sig Start Date End Date Taking? Authorizing Provider  acetaminophen (TYLENOL) 500 MG tablet Take 1,000 mg by mouth every 6 (six) hours as needed.   Yes [provider]  buPROPion (WELLBUTRIN XL) 150 MG 24 hr tablet Take 150 mg by mouth daily.   Yes [provider]  Calcium Carbonate-Vit D-Min (CALCIUM 1200) 1200-1000 MG-UNIT CHEW Chew 1 tablet by mouth daily. 08/27/16  Yes Nicholas Lose, MD  diphenhydrAMINE (BENADRYL) 50 MG capsule Take 1 capsule (50 mg total) by mouth at bedtime as needed. 08/27/16  Yes Nicholas Lose, MD  docusate sodium (COLACE) 100 MG capsule Take 100 mg by mouth daily.   Yes [provider]  fentaNYL (DURAGESIC - DOSED MCG/HR) 50 MCG/HR Place 1 patch (50 mcg total) onto the skin every 3 (three) days. 08/27/16  Yes Nicholas Lose, MD  ibuprofen (ADVIL,MOTRIN) 800 MG tablet Take 800 mg by mouth every 8 (eight) hours as needed for pain.   Yes [provider]  ondansetron (ZOFRAN) 8 MG tablet Take 1 tablet (8 mg total) by mouth 2 (two) times daily. 08/27/16  Yes Nicholas Lose, MD  oxyCODONE (OXY IR/ROXICODONE) 5 MG immediate release tablet Take 1  tablet (5 mg total) by mouth every 6 (six) hours as needed for severe pain. 08/31/16  Yes Nicholas Lose, MD  palbociclib Leslee Home) 125 MG capsule Take 1 capsule (125 mg total) by mouth daily with breakfast. Take whole with food. 08/27/16  Yes Nicholas Lose, MD  pantoprazole sodium (PROTONIX) 40 mg/20 mL PACK Place 20 mLs (40 mg total) into feeding tube daily. 08/27/16  Yes Nicholas Lose, MD    sucralfate (CARAFATE) 1 GM/10ML suspension Take 10 mLs (1 g total) by mouth 4 (four) times daily -  with meals and at bedtime. 08/27/16   Nicholas Lose, MD     Family History  Problem Relation Age of Onset  . Depression Mother     Social History   Social History  . Marital status: Legally Separated    Spouse name: N/A  . Number of children: N/A  . Years of education: N/A   Social History Main Topics  . Smoking status: Former Research scientist (life sciences)  . Smokeless tobacco: Never Used  . Alcohol use No  . Drug use: No  . Sexual activity: Yes    Partners: Male    Birth control/ protection: Other-see comments     Comment: BTL   Other Topics Concern  . None   Social History Narrative   ** Merged History Encounter **        Review of Systems  Constitutional: Negative for fatigue and fever.  Respiratory: Negative for cough and shortness of breath.   Cardiovascular: Negative for chest pain.  Psychiatric/Behavioral: Negative for behavioral problems and confusion.    Vital Signs: BP 125/63 (BP Location: Left Leg)   Pulse (!) 110   Temp 98.3 F (36.8 C) (Oral)   Resp 16   Ht 5\' 4"  (1.626 m)   Wt 143 lb 4.8 oz (65 kg)   LMP 11/23/2013 Comment: last period was very light, spotting the week before  SpO2 100%   BMI 24.60 kg/m   Physical Exam  Constitutional: She is oriented to person, place, and time. She appears well-developed.  Cardiovascular: Normal rate, regular rhythm and normal heart sounds.   Pulmonary/Chest: Effort normal and breath sounds normal.  Abdominal: She exhibits distension.  Neurological: She is alert and oriented to person, place, and time.  Psychiatric: She has a normal mood and affect. Her behavior is normal. Judgment and thought content normal.  Nursing note and vitals reviewed.   Mallampati Score:  MD Evaluation Airway: WNL Heart: WNL Abdomen: WNL Chest/ Lungs: WNL ASA  Classification: 3 Mallampati/Airway Score: Two  Imaging: Ct Abdomen Pelvis Wo  Contrast  Result Date: 09/09/2016 CLINICAL DATA:  51 year old female with history of abdominal pain radiating to the back. History of breast cancer, currently undergoing therapy. EXAM: CT ABDOMEN AND PELVIS WITHOUT CONTRAST TECHNIQUE: Multidetector CT imaging of the abdomen and pelvis was performed following the standard protocol without IV contrast. COMPARISON:  None. FINDINGS: Lower chest: Small right and trace pleural effusions lying dependently with some associated passive subsegmental atelectasis in the dependent portions of the lower lobes of the lungs bilaterally. Status post right mastectomy with right-sided breast implant. Hepatobiliary: No discrete cystic or solid hepatic lesions are confidently identified on today's noncontrast CT examination. Unenhanced appearance of the gallbladder is unremarkable. Pancreas: No definite pancreatic mass or peripancreatic inflammatory changes are noted on today's noncontrast CT examination. Spleen: Unremarkable. Adrenals/Urinary Tract: 4 mm nonobstructive calculus in the upper pole collecting system of the right kidney. No additional calculi are identified within the left renal collecting system,  along the course of either ureter or within the lumen of the urinary bladder. No hydroureteronephrosis to indicate urinary tract obstruction at this time. Urinary bladder is normal in appearance. Bilateral adrenal glands are enlarged and nodular in appearance, with the largest of these masses noted on the right side measuring approximately 2.9 x 4.8 cm (axial image 30 of series 2) with either significant mass effect upon or direct invasion into the adjacent inferior vena cava. Stomach/Bowel: Unenhanced appearance of the stomach is normal. No pathologic dilatation of small bowel or colon. The appendix is not confidently identified. In the right upper quadrant of the abdomen immediately anterior to the liver best appreciated on axial image 15 of series 2, coronal image 52 of  series 4 and sagittal image 65 of series 5 there is a gas collection which appears to be extraluminal. Based on the appearance on coronal image 52 of series 4, this appears to represent focal contained perforation of a loop of ileum (best appreciated when viewing images on lung window setting). Vascular/Lymphatic: Aortic atherosclerosis. No definite aneurysm identified in the abdominal or pelvic vasculature on today's noncontrast CT examination. Multiple prominent borderline enlarged retroperitoneal lymph nodes measuring up to 8 mm in short axis are nonspecific. Reproductive: Unenhanced appearance of the uterus and left ovary is unremarkable. In the right side of the pelvis closely associated with the right adnexal region there is a large soft tissue mass measuring 7.5 x 9.3 x 6.0 cm (axial image 76 of series 2 and coronal image 57 of series 4). Other: Large volume of ascites. Abdominal drain entering the right-sided abdomen with the tip extending into the mid anatomic pelvis anteriorly slightly to the left of midline. No pneumoperitoneum. Musculoskeletal: Several mixed lytic and sclerotic lesions are noted throughout the visualized axial and appendicular skeleton, compatible with widespread metastatic disease to the bones. The largest of these lesions occupies the T8 vertebral body where there is a pathologic compression fracture with approximately 15-20% loss of anterior vertebral body height. IMPRESSION: 1. Findings are highly concerning for contained perforated loop of distal small bowel (presumably ileum) in the right upper quadrant immediately anterior to the liver, as detailed above. Surgical consultation is recommended. 2. 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 3. Large volume of presumably malignant ascites. 4. Small right and trace left pleural effusions with areas of passive atelectasis in the lung bases bilaterally. 5. Aortic atherosclerosis. 6. Additional incidental findings, as above. These results were called by telephone at the time of interpretation on 09/09/2016 at 6:51 pm to Dr. Canary Brim, who verbally acknowledged these results. Aortic Atherosclerosis (ICD10-I70.0). Electronically Signed   By: Vinnie Langton M.D.   On: 09/09/2016 18:59   Ct Angio Chest Pe W/cm &/or Wo Cm  Result Date: 08/30/2016 CLINICAL DATA:  Thoracic and lumbar pain with dyspnea. History of breast cancer with possible metastasis. EXAM: CT ANGIOGRAPHY CHEST WITH CONTRAST TECHNIQUE: Multidetector CT imaging of the chest was performed using the standard protocol during bolus administration of intravenous contrast. Multiplanar CT image reconstructions and MIPs were obtained to evaluate the vascular anatomy. CONTRAST:  100 cc Isovue 370 IV COMPARISON:  None. FINDINGS: Cardiovascular: Normal heart size without  pericardial effusion. No aortic aneurysm or dissection. No acute pulmonary embolus. Mediastinum/Nodes: 1 cm cystic nodule of the left thyroid gland with smaller 0.4 cm cystic nodule adjacent to it. No thyromegaly. Normal branch pattern of the great vessels. No mediastinal, axillary adenopathy. Right axillary lymph node dissection clips are noted. No hilar lymphadenopathy in is seen. Lungs/Pleura: Moderate right sided pleural effusion with adjacent compressive atelectasis. Ground-glass appearance of the remainder of the lungs likely reflect stigmata of hypoventilatory change or possibly pulmonary edema, alveolitis or pneumonitis among some possibilities though not exclusive. No dominant mass is seen. Upper Abdomen: Moderate volume of ascites noted in the  upper included abdomen with omental thickening in the left upper quadrant and mesenteric edema. No space-occupying mass of the included liver. Status post right mastectomy with intact appearing right saline breast implant. Musculoskeletal: Mixed osteolytic and sclerotic appearance of the T5 and T8 vertebral bodies with more subtle lytic appearance of the T4 and T9 vertebral bodies concerning for osseous metastatic disease. Review of the MIP images confirms the above findings. IMPRESSION: 1. No acute pulmonary embolus. 2. Moderate right effusion with compressive atelectasis. 3. Moderate amount of ascites seen of the included abdomen. Mesenteric edema and induration is noted with mild omental thickening. 4. 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   On: 08/30/2016 21:12   Ct Thoracic Spine Wo Contrast  Result Date: 08/30/2016 CLINICAL DATA:  Mid back pain, felt a pop midthoracic spine at site of prior fracture. History of metastatic breast cancer. Suspect pathologic fracture. EXAM: CT THORACIC AND LUMBAR SPINE WITHOUT CONTRAST TECHNIQUE: Multidetector CT imaging of the thoracic and lumbar spine was performed without contrast. Multiplanar CT image reconstructions were also generated. COMPARISON:  None. FINDINGS: CT THORACIC SPINE FINDINGS ALIGNMENT: Maintained thoracic lordosis. No malalignment. VERTEBRAE: Multiple lytic lesions within the vertebral bodies compatible with metastasis (T3 through T5, T8 and T9, T12). Expansile lytic lesion T4 spinous process with nondisplaced pathologic fracture. Moderate T8 pathologic fracture with approximately 50% height loss is likely acute. Mild T5 superior endplate pathologic fracture with central less than 25% superior endplate height loss, chronic appearing. No retropulsed bony fragments. Intervertebral disc heights generally preserved with multilevel mild ventral endplate spurring. PARASPINAL AND  OTHER SOFT TISSUES: Paraspinal soft tissues are normal. Please see CT of chest from same day, reported separately for dedicated findings. 10 mm LEFT thyroid nodule, below size followup recommendation. DISC LEVELS: No osseous canal stenosis or neural foraminal narrowing at any level. CT LUMBAR SPINE FINDINGS SEGMENTATION: For the purposes of this report the last well-formed intervertebral disc space is reported as L5-S1. ALIGNMENT: Maintained lumbar lordosis. No malalignment. VERTEBRAE: Lytic lesions in the sacrum. Subcentimeter lytic lesion L1, L2 L5. Vertebral bodies and posterior elements intact. Intervertebral disc heights preserved. Severe sacroiliac osteoarthrosis. PARASPINAL AND OTHER SOFT TISSUES: 3.6 RIGHT and 2.1 cm LEFT adrenal masses. 4 mm nonobstructing LEFT interpolar nephrolithiasis. Contrast in the colon. Ascites. DISC LEVELS: L1-2 through L4-5: No disc bulge, canal stenosis nor neural foraminal narrowing. L5-S1: Small broad-based disc bulge and endplate spurring. No canal stenosis. Moderate RIGHT neural foraminal narrowing. IMPRESSION: CT THORACIC SPINE IMPRESSION 1. Multiple lytic lesions consistent with osseous metastasis. Mild T5 and moderate T8 pathologic fractures without retropulsion. 2. No canal stenosis or neural foraminal narrowing. CT LUMBAR SPINE IMPRESSION 1. Multiple lytic lesions consistent with osseous metastasis. No pathologic fracture. 2. Included abdomen demonstrates bilateral adrenal masses concerning for metastasis. Ascites. Recommend  dedicated imaging. Electronically Signed   By: Elon Alas M.D.   On: 08/30/2016 21:15   Ct Lumbar Spine Wo Contrast  Result Date: 08/30/2016 CLINICAL DATA:  Mid back pain, felt a pop midthoracic spine at site of prior fracture. History of metastatic breast cancer. Suspect pathologic fracture. EXAM: CT THORACIC AND LUMBAR SPINE WITHOUT CONTRAST TECHNIQUE: Multidetector CT imaging of the thoracic and lumbar spine was performed without  contrast. Multiplanar CT image reconstructions were also generated. COMPARISON:  None. FINDINGS: CT THORACIC SPINE FINDINGS ALIGNMENT: Maintained thoracic lordosis. No malalignment. VERTEBRAE: Multiple lytic lesions within the vertebral bodies compatible with metastasis (T3 through T5, T8 and T9, T12). Expansile lytic lesion T4 spinous process with nondisplaced pathologic fracture. Moderate T8 pathologic fracture with approximately 50% height loss is likely acute. Mild T5 superior endplate pathologic fracture with central less than 25% superior endplate height loss, chronic appearing. No retropulsed bony fragments. Intervertebral disc heights generally preserved with multilevel mild ventral endplate spurring. PARASPINAL AND OTHER SOFT TISSUES: Paraspinal soft tissues are normal. Please see CT of chest from same day, reported separately for dedicated findings. 10 mm LEFT thyroid nodule, below size followup recommendation. DISC LEVELS: No osseous canal stenosis or neural foraminal narrowing at any level. CT LUMBAR SPINE FINDINGS SEGMENTATION: For the purposes of this report the last well-formed intervertebral disc space is reported as L5-S1. ALIGNMENT: Maintained lumbar lordosis. No malalignment. VERTEBRAE: Lytic lesions in the sacrum. Subcentimeter lytic lesion L1, L2 L5. Vertebral bodies and posterior elements intact. Intervertebral disc heights preserved. Severe sacroiliac osteoarthrosis. PARASPINAL AND OTHER SOFT TISSUES: 3.6 RIGHT and 2.1 cm LEFT adrenal masses. 4 mm nonobstructing LEFT interpolar nephrolithiasis. Contrast in the colon. Ascites. DISC LEVELS: L1-2 through L4-5: No disc bulge, canal stenosis nor neural foraminal narrowing. L5-S1: Small broad-based disc bulge and endplate spurring. No canal stenosis. Moderate RIGHT neural foraminal narrowing. IMPRESSION: CT THORACIC SPINE IMPRESSION 1. Multiple lytic lesions consistent with osseous metastasis. Mild T5 and moderate T8 pathologic fractures without  retropulsion. 2. No canal stenosis or neural foraminal narrowing. CT LUMBAR SPINE IMPRESSION 1. Multiple lytic lesions consistent with osseous metastasis. No pathologic fracture. 2. Included abdomen demonstrates bilateral adrenal masses concerning for metastasis. Ascites. Recommend dedicated imaging. Electronically Signed   By: Elon Alas M.D.   On: 08/30/2016 21:15   US Paracentesis  Result Date: 09/11/2016 INDICATION: History of metastatic breast cancer with metastatic tumor in the pelvis. Recurrent abdominal ascites. Patient had a PleurX catheter placed [place] about 6 weeks ago. Unfortunately, this is no longer working. Request is made today for evaluation for a paracentesis since her catheter is not working. EXAM: ULTRASOUND GUIDED DIAGNOSTIC AND THERAPEUTIC PARACENTESIS MEDICATIONS: 2% xylocaine with epinephrine COMPLICATIONS: None immediate. PROCEDURE: Informed written consent was obtained from the patient after a discussion of the risks, benefits and alternatives to treatment. A timeout was performed prior to the initiation of the procedure. Initial ultrasound scanning demonstrates a small amount of loculated ascites within the left upper abdominal quadrant. The left upper abdomen was prepped and draped in the usual sterile fashion. 2% xylocaine with epinephrine was used for local anesthesia. Following this, a 19 gauge, 7-cm, Yueh catheter was introduced. An ultrasound image was saved for documentation purposes. The paracentesis was performed. The catheter was removed and a dressing was applied. The patient tolerated the procedure well without immediate post procedural complication. FINDINGS: A total of approximately 1.6 L of serous fluid was removed. Samples were sent to the laboratory as requested by the clinical team. The patient was  noted to have significant complex ascites noted throughout her abdomen. This was most densely loculated within the pelvis. IMPRESSION: Successful ultrasound-guided  paracentesis yielding 1.6 liters of peritoneal fluid. Complex ascites with significant loculations, specifically within the pelvis. PleurX catheter is unlikely to ever work given complexity of fluid. Recommendation would be for removal. This was discussed with Dr. Earleen [place] as well as Dr. Posey Pronto. Read by: Saverio Danker, PA-C Electronically Signed   By: Corrie Mckusick D.O.   On: 09/11/2016 15:10    Labs:  CBC:  Recent Labs  09/10/16 0517 09/11/16 1225 09/12/16 0515 09/13/16 0532  WBC 2.8* 2.3* 1.8* 1.8*  HGB 9.7* 8.6* 7.9* 7.9*  HCT 29.6* 26.1* 24.2* 24.3*  PLT 72* 60* 49* 45*    COAGS: No results for input(s): INR, APTT in the last 8760 hours.  BMP:  Recent Labs  09/10/16 0517 09/11/16 1225 09/12/16 0515 09/13/16 0532  NA 137 138 139 137  K 3.3* 3.8 3.8 3.6  CL 105 106 107 105  CO2 23 24 24 24   GLUCOSE 94 85 99 109*  BUN <5* <5* <5* <5*  CALCIUM 6.8* 6.6* 6.3* 6.4*  CREATININE 0.51 0.42* 0.43* 0.44  GFRNONAA >60 >60 >60 >60  GFRAA >60 >60 >60 >60    LIVER FUNCTION TESTS:  Recent Labs  07/10/16 1033 08/27/16 1326 09/09/16 1413 09/11/16 1225  BILITOT 0.4 <0.22 0.5 0.6  AST 19 16 23  14*  ALT 13* 8 19 13*  ALKPHOS 38 59 81 80  PROT 5.9* 5.9* 4.7* 4.5*  ALBUMIN 3.5 2.2* 1.3* 1.2*    TUMOR MARKERS: No results for input(s): AFPTM, CEA, CA199, CHROMGRNA in the last 8760 hours.  Assessment and Plan: Patient with past medical history of metastatic breast cancer presents with complaint of recurrent ascites despite placement of Pleurx catheter in June 2018.  IR consulted for Pleurx catheter manipulation/evaluation at the request of Dr. Posey Pronto. After discussed with patient she requests drain removal. Case reviewed by Dr. Anselm Pancoast who approves patient for procedure.  Patient presents today in their usual state of health.  She has been NPO and is not currently on blood thinners.   Thank you for this interesting consult.  I greatly enjoyed meeting Tamara Byrd and look  forward to participating in their care.  A copy of this report was sent to the requesting provider on this date.  Electronically Signed: Docia Barrier, PA 09/13/2016, 10:23 AM   I spent a total of 40 Minutes    in face to face in clinical consultation, greater than 50% of which was counseling/coordinating care for malignant ascites.

## 2016-09-13 NOTE — Progress Notes (Signed)
   09/13/16 1300  Clinical Encounter Type  Visited With Patient  Visit Type Initial;Psychological support;Spiritual support  Referral From Nurse  Consult/Referral To Chaplain  Spiritual Encounters  Spiritual Needs Other (Comment);Brochure Market researcher Information )  Stress Factors  Patient Stress Factors Other (Comment) (Making Mother HCPOA)  Advance Directives (For Healthcare)  Does Patient Have a Medical Advance Directive? No  Would patient like information on creating a medical advance directive? Yes (Inpatient - patient requests chaplain consult to create a medical advance directive) (Patient Given Advance Directive Document )   I visited with the patient per [place] consult for an Advance Directive. The patient was getting ready to go down for a procedure, but I gave her the Advance Directive document.  The patient stated that she has children, but wants her mother to be her Press photographer.  I left my card and told her that she could have the nurse contact [place] when she is ready to complete the document.   Please, contact Spiritual Care for further assistance.   [place] M.Div.

## 2016-09-13 NOTE — Progress Notes (Signed)
HEMATOLOGY-ONCOLOGY PROGRESS NOTE  SUBJECTIVE: Patient is admitted with intractable abdominal pain requiring pain medication adjustments accompanied by worsening ascites related to loculated ascites.    Metastatic breast cancer ([place])   01/21/2014 Initial Diagnosis    Right breast cancer status post mastectomy and axillary lymph node dissection multifocal disease 4.4 cm, 1.1 cm, 1 mm, ER 93%, PR 40-50%, HER-2 negative ratio 1.3, T2 N3 a stage IIIc diagnosed at [facility] by Dr.Katragadda; treated with adjuvant chemotherapy with before meals 4 followed by Taxotere 12 followed by radiation and tamoxifen      07/15/2016 Relapse/Recurrence    Patient presented with abdominal distention and ascites; CT 07/15/2016 revealed ascites, pleural effusion, pericardial masses, 8 mm right liver nodule, adrenal nodule; biopsy metastatic breast cancer ER/PR positive HER-2 negative; MRI pelvis 07/15/2016 pelvic mass is 5.27 minutes, 5.6 cm, right adrenal nodule 4.2 cm      07/23/2016 Miscellaneous    Paracentesis 1.7 L cytology negative, peritoneal catheter, patient claims that once or twice a week      07/26/2016 PET scan    Pelvic masses, intrarenal mass, portal caval lymph node, anterior mediastinal lymph node, several bone lesions T8 compression fracture and right femur concern for impending fracture, right pleural effusion, ascites      08/08/2016 -  Anti-estrogen oral therapy    Faslodex (loading doses given on 08/08/2016 and 08/23/2016) Palbociclib (started 08/23/2016) and Xgeva (last injection 08/12/2016)      08/10/2016 - 08/24/2016 Radiation Therapy    Radiation therapy to T8 and bilateral femurs (complication: Esophagitis): Radiation done in [place] ([facility])       OBJECTIVE: REVIEW OF SYSTEMS:   Constitutional: Denies fevers, chills or abnormal weight loss Eyes: Denies blurriness of vision Ears, nose, mouth, throat, and face: Denies mucositis or sore  throat Respiratory: Denies cough, dyspnea or wheezes Cardiovascular: Denies palpitation, chest discomfort Gastrointestinal: Abdominal pain, ascites Skin: Denies abnormal skin rashes Lymphatics: Denies new lymphadenopathy or easy bruising Neurological:Denies numbness, tingling or new weaknesses Behavioral/Psych: Mood is stable, no new changes  Extremities: No lower extremity edema  All other systems were reviewed with the patient and are negative.  I have reviewed the past medical history, past surgical history, social history and family history with the patient and they are unchanged from previous note.   PHYSICAL EXAMINATION: ECOG PERFORMANCE STATUS: 3 - Symptomatic, >50% confined to bed  Vitals:   09/13/16 1539 09/13/16 1600  BP: 133/68   Pulse: (!) 110   Resp: 16 16  Temp: 98.2 F (36.8 C)   SpO2: 99% 98%   Filed Weights   09/10/16 0618  Weight: 143 lb 4.8 oz (65 kg)    GENERAL:alert, no distress and comfortable SKIN: skin color, texture, turgor are normal, no rashes or significant lesions EYES: normal, Conjunctiva are pink and non-injected, sclera clear OROPHARYNX:no exudate, no erythema and lips, buccal mucosa, and tongue normal  NECK: supple, thyroid normal size, non-tender, without nodularity LYMPH:  no palpable lymphadenopathy in the cervical, axillary or inguinal LUNGS: clear to auscultation and percussion with normal breathing effort HEART: regular rate & rhythm and no murmurs and no lower extremity edema ABDOMEN:Distended with ascites but much improved from before Musculoskeletal:no cyanosis of digits and no clubbing  NEURO: alert & oriented x 3 with fluent speech, no focal motor/sensory deficits  LABORATORY DATA:  I have reviewed the data as listed CMP Latest Ref Rng & Units 09/13/2016 09/12/2016 09/11/2016  Glucose 65 - 99 mg/dL 109(H) 99 85  BUN 6 -  20 mg/dL <5(L) <5(L) <5(L)  Creatinine 0.44 - 1.00 mg/dL 0.44 0.43(L) 0.42(L)  Sodium 135 - 145 mmol/L 137  139 138  Potassium 3.5 - 5.1 mmol/L 3.6 3.8 3.8  Chloride 101 - 111 mmol/L 105 107 106  CO2 22 - 32 mmol/L 24 24 24   Calcium 8.9 - 10.3 mg/dL 6.4(LL) 6.3(LL) 6.6(L)  Total Protein 6.5 - 8.1 g/dL - - 4.5(L)  Total Bilirubin 0.3 - 1.2 mg/dL - - 0.6  Alkaline Phos 38 - 126 U/L - - 80  AST 15 - 41 U/L - - 14(L)  ALT 14 - 54 U/L - - 13(L)    Lab Results  Component Value Date   WBC 1.8 (L) 09/13/2016   HGB 7.9 (L) 09/13/2016   HCT 24.3 (L) 09/13/2016   MCV 91.7 09/13/2016   PLT 45 (L) 09/13/2016   NEUTROABS 1.4 (L) 09/13/2016    ASSESSMENT AND PLAN: 1. Malignant ascites: Status post removal of pericolic catheter because it was nonfunctioning. patient will need paracentesis on an as-needed basis 2. abdominal pain: Currently on Duragesic patch and Dilaudid PCA pump under the care of palliative care. 3. Metastatic breast cancer: Once she is off antibiotic she can resume Palbociclib. She is due for Faslodex and Xgeva injections. These will be given after she gets discharged from the hospital. 4. Opioid-induced constipation: In spite of hydration persistent in nature. I have used Relistor previously with good success. I will start her on this medicine here. 5. Anxiety: Patient is requesting Ativan prescription. We can add that when necessary.

## 2016-09-13 NOTE — Progress Notes (Signed)
[facility] Progress Note  Patient: Tamara Byrd SEG:[id number]   PCP: Olena Mater, MD DOB: Aug 24, 1965   DOA: 09/09/2016   DOS: 09/13/2016   Date of Service: the patient was seen and examined on 09/13/2016  Subjective: did have large BM yesterday after getting relistar. Still has bloating of the abdomen and distention. No nausea no vomiting. No fever no chills.  Brief hospital course: Pt. with PMH of Metastatic breast cancer with recurrence, malignant ascites and S/P peritoneal catheter placement, depression, anxiety; admitted on 09/09/2016, presented with complaint of severe abdominal pain, was found to have abdominal pain related to cancer uncontrolled. Also has SBP with staph aureus Currently further plan is continue pain regimen control.  Assessment and Plan: 1. Intractable abdominal pain. Abdominal metastasis with malignant ascites.  Increase Duragesic patch to 75 g every 72 hours. Hydromorphone 1 mg IVPB every 30 minutes as needed. Antiemetics as needed. Her free air could also represent air from the catheter versus contain bowel perforation although the patient would not be a good candidate for any surgical evaluation due to her metastatic disease. Underwent paracentesis which improved her symptoms but since yesterday the patient still continues to complain about abdominal distention and adnexa which ultrasound abdomen to rule out any further worsening of ascites. Palliative medicine consulted.  Appreciate input.  Currently on PCA for pain control Patient has significant ascites with loculation and therefore catheter placed in one area would not be effective in the long-term and possibly likely has led to her current SBP therefore her peritoneal catheter was removed.  SBP. Peritoneal fluid analysis shows increased WBC neutrophil predominance. LDH elevated. Culture is growing Staphylococcus aureus, sensitivities are currently pending. We'll continue the patient on IV  vancomycin and IV cefepime to cover her empirically. Await culture clearance. Peritoneal drainage catheter removed.  Active Problems:   Metastatic breast cancer [facility]) The patient is currently getting palliative chemotherapy. Will have to stop Ibrance due to abdominal pain as well as thrombocytopenia Palliative care consulted primarily for pain management as well as assistance with goals of care.    Depression Continue Wellbutrin. Anxiety control.    Anxiety Will continue lorazepam 0.5 mg IVP every 2 hours as needed.    Malignant ascites Per patient, she has not been able to drain the peritoneal catheter at home. S/P paracentesis 1. Significantly loculated effusion therefore permanent catheter will not be a solution for the patient. It is actually a source of infection for patient currently suspected SBP and therefore it has to be removed.    Pancytopenia ([place]) Secondary to chemotherapy. Currently holding chemotherapy for the same. Monitor daily CBC.  Diet: full liquid diet DVT Prophylaxis: mechanical compression device  Advance goals of care discussion: DNR DNI  Family Communication: family was present at bedside, at the time of interview.   Disposition:  Discharge to home. Likely Tuesday.  Consultants: Oncology, general surgery, palliative medicine, interventional radiology Procedures: PICC line placement due to poor IV access, ultrasound paracentesis  Antibiotics: Anti-infectives    Start     Dose/Rate Route Frequency Ordered Stop   09/13/16 2200  vancomycin (VANCOCIN) IVPB 750 mg/150 ml premix     750 mg 150 mL/hr over 60 Minutes Intravenous Every 12 hours 09/12/16 1257     09/13/16 0400  ceFEPIme (MAXIPIME) 2 g in dextrose 5 % 50 mL IVPB     2 g 100 mL/hr over 30 Minutes Intravenous Every 12 hours 09/12/16 1820     09/12/16 2200  ceFEPIme (MAXIPIME) 2 g in  dextrose 5 % 50 mL IVPB  Status:  Discontinued     2 g 100 mL/hr over 30 Minutes Intravenous Every 12  hours 09/12/16 1146 09/12/16 1820   09/12/16 1300  vancomycin (VANCOCIN) IVPB 1000 mg/200 mL premix     1,000 mg 200 mL/hr over 60 Minutes Intravenous  Once 09/12/16 1257 09/12/16 1543   09/12/16 1200  ceFEPIme (MAXIPIME) 2 g in dextrose 5 % 50 mL IVPB     2 g 100 mL/hr over 30 Minutes Intravenous  Once 09/12/16 1134 09/12/16 1704       Objective: Physical Exam: Vitals:   09/13/16 0800 09/13/16 1004 09/13/16 1132 09/13/16 1539  BP:  125/63  133/68  Pulse:  (!) 110  (!) 110  Resp: 16 16 14 16   Temp:  98.3 F (36.8 C)  98.2 F (36.8 C)  TempSrc:  Oral  Axillary  SpO2: 100% 100% 100% 99%  Weight:      Height:        Intake/Output Summary (Last 24 hours) at 09/13/16 1600 Last data filed at 09/13/16 1400  Gross per 24 hour  Intake             2540 ml  Output              400 ml  Net             2140 ml   Filed Weights   09/10/16 0618  Weight: 65 kg (143 lb 4.8 oz)   General: Alert, Awake and Oriented to Time, Place and Person. Appear in moderate distress, affect appropriate Eyes: PERRL, Conjunctiva normal ENT: Oral Mucosa clear moist. Neck: no JVD, no Abnormal Mass Or lumps Cardiovascular: S1 and S2 Present, no Murmur, Peripheral Pulses Present Respiratory: normal respiratory effort, Bilateral Air entry equal and Decreased, no use of accessory muscle, Clear to Auscultation, no Crackles, no wheezes Abdomen: Bowel Sound present, Soft and moderate diffuse tenderness, skin: no redness, no Rash, no induration Extremities: trace Pedal edema, no calf tenderness Neurologic: Grossly no focal neuro deficit. Bilaterally Equal motor strength  Data Reviewed: CBC:  Recent Labs Lab 09/09/16 1413 09/10/16 0517 09/11/16 1225 09/12/16 0515 09/13/16 0532  WBC 3.1* 2.8* 2.3* 1.8* 1.8*  NEUTROABS  --   --  2.0 1.5* 1.4*  HGB 10.1* 9.7* 8.6* 7.9* 7.9*  HCT 30.5* 29.6* 26.1* 24.2* 24.3*  MCV 92.1 91.6 91.6 92.0 91.7  PLT 82* 72* 60* 49* 45*   Basic Metabolic Panel:  Recent  Labs Lab 09/09/16 1413 09/10/16 0517 09/11/16 1225 09/12/16 0515 09/13/16 0532  NA 140 137 138 139 137  K 3.5 3.3* 3.8 3.8 3.6  CL 107 105 106 107 105  CO2 24 23 24 24 24   GLUCOSE 88 94 85 99 109*  BUN 6 <5* <5* <5* <5*  CREATININE 0.46 0.51 0.42* 0.43* 0.44  CALCIUM 7.0* 6.8* 6.6* 6.3* 6.4*  MG  --   --   --  1.8  --     Liver Function Tests:  Recent Labs Lab 09/09/16 1413 09/11/16 1225  AST 23 14*  ALT 19 13*  ALKPHOS 81 80  BILITOT 0.5 0.6  PROT 4.7* 4.5*  ALBUMIN 1.3* 1.2*    Recent Labs Lab 09/09/16 1413  LIPASE 16   No results for input(s): AMMONIA in the last 168 hours. Coagulation Profile: No results for input(s): INR, PROTIME in the last 168 hours. Cardiac Enzymes: No results for input(s): CKTOTAL, CKMB, CKMBINDEX, TROPONINI in the last 168  hours. BNP (last 3 results) No results for input(s): PROBNP in the last 8760 hours. CBG: No results for input(s): GLUCAP in the last 168 hours. Studies: Ir Removal Perm Peritoneal Cath  Result Date: 09/13/2016 INDICATION: 51 year old with history of metastatic breast cancer. Patient has a tunneled peritoneal catheter which is no longer working and loculated ascites on recent imaging. Plan for removal of the peritoneal catheter. EXAM: REMOVAL OF TUNNELED PERITONEAL CATHETER MEDICATIONS: None ANESTHESIA/SEDATION: None COMPLICATIONS: None immediate. PROCEDURE: Informed written consent was obtained from the patient after a thorough discussion of the procedural risks, benefits and alternatives. All questions were addressed. Maximal Sterile Barrier Technique was utilized including caps, mask, sterile gowns, sterile gloves, sterile drape, hand hygiene and skin antiseptic. A timeout was performed prior to the initiation of the procedure. The right abdominal catheter was prepped and draped in a sterile fashion. Skin around the catheter was anesthetized with 1% lidocaine with epinephrine. The cuff was dissected free and exposed. The  catheter was removed without complication. Bandage placed over the catheter exit site. IMPRESSION: Successful removal of the tunneled peritoneal catheter. Electronically Signed   By: Markus Daft M.D.   On: 09/13/2016 15:26    Scheduled Meds: . fentaNYL  150 mcg Transdermal Q72H  . hydrocortisone   Rectal BID  . HYDROmorphone   Intravenous Q4H  . lidocaine-EPINEPHrine      . pantoprazole (PROTONIX) IV  40 mg Intravenous QHS   Continuous Infusions: . 0.9 % NaCl with KCl 20 mEq / L 75 mL/hr at 09/12/16 2113  . ceFEPime (MAXIPIME) IV Stopped (09/13/16 0500)  . vancomycin     PRN Meds: diphenhydrAMINE, HYDROmorphone (DILAUDID) injection, lidocaine-EPINEPHrine, LORazepam, naloxone **AND** sodium chloride flush, ondansetron **OR** ondansetron (ZOFRAN) IV, sodium chloride flush  Time spent: 35 minutes  Author: Berle Mull, MD [facility] Pager: [phone] 09/13/2016 4:00 PM  If 7PM-7AM, please contact night-coverage at [email or web address], password [facility]

## 2016-09-14 ENCOUNTER — Inpatient Hospital Stay (HOSPITAL_COMMUNITY): Payer: BLUE CROSS/BLUE SHIELD

## 2016-09-14 ENCOUNTER — Telehealth: Payer: Self-pay | Admitting: Pharmacist

## 2016-09-14 ENCOUNTER — Encounter: Admit: 2016-09-14 | Discharge: 2016-09-15

## 2016-09-14 DIAGNOSIS — R69 Illness, unspecified: Principal | ICD-10-CM

## 2016-09-14 LAB — BASIC METABOLIC PANEL
Anion gap: 8 (ref 5–15)
BUN: <5 mg/dL — ABNORMAL LOW (ref 6–20)
CALCIUM: 6.6 mg/dL — AB (ref 8.9–10.3)
CO2: 25 mmol/L (ref 22–32)
CREATININE: 0.45 mg/dL (ref 0.44–1.00)
Chloride: 106 mmol/L (ref 101–111)
GFR calc non Af Amer: >60 mL/min (ref >60)
GLUCOSE: 108 mg/dL — AB (ref 65–99)
Potassium: 3.3 mmol/L — ABNORMAL LOW (ref 3.5–5.1)
Sodium: 139 mmol/L (ref 135–145)

## 2016-09-14 LAB — CBC WITH DIFFERENTIAL/PLATELET
BASOS PCT: 0 %
Basophils Absolute: 0 10*3/uL (ref 0.0–0.1)
Eosinophils Absolute: 0 10*3/uL (ref 0.0–0.7)
Eosinophils Relative: 1 %
HEMATOCRIT: 23.2 % — AB (ref 36.0–46.0)
Hemoglobin: 7.7 g/dL — ABNORMAL LOW (ref 12.0–15.0)
Lymphocytes Relative: 17 %
Lymphs Abs: 0.2 10*3/uL — ABNORMAL LOW (ref 0.7–4.0)
MCH: 31 pg (ref 26.0–34.0)
MCHC: 33.2 g/dL (ref 30.0–36.0)
MCV: 93.5 fL (ref 78.0–100.0)
MONO ABS: 0.1 10*3/uL (ref 0.1–1.0)
MONOS PCT: 8 %
NEUTROS ABS: 0.9 10*3/uL — AB (ref 1.7–7.7)
Neutrophils Relative %: 74 %
Platelets: 40 10*3/uL — ABNORMAL LOW (ref 150–400)
RBC: 2.48 MIL/uL — ABNORMAL LOW (ref 3.87–5.11)
RDW: 13.7 % (ref 11.5–15.5)
WBC: 1.2 10*3/uL — CL (ref 4.0–10.5)

## 2016-09-14 LAB — CULTURE, BODY FLUID-BOTTLE

## 2016-09-14 LAB — CULTURE, BODY FLUID W GRAM STAIN -BOTTLE

## 2016-09-14 MED ORDER — POTASSIUM CHLORIDE CRYS ER 20 MEQ PO TBCR
40.0000 meq | EXTENDED_RELEASE_TABLET | Freq: Once | ORAL | Status: AC
  Administered 2016-09-14: 40 meq via ORAL
  Filled 2016-09-14: qty 2

## 2016-09-14 MED ORDER — HEPARIN SOD (PORK) LOCK FLUSH 100 UNIT/ML IV SOLN
250.0000 [IU] | INTRAVENOUS | Status: AC | PRN
  Administered 2016-09-16: 15:00:00

## 2016-09-14 MED ORDER — PANTOPRAZOLE SODIUM 40 MG PO TBEC
40.0000 mg | DELAYED_RELEASE_TABLET | Freq: Every day | ORAL | Status: DC
  Administered 2016-09-14 – 2016-09-15 (×2): 40 mg via ORAL
  Filled 2016-09-14 (×2): qty 1

## 2016-09-14 MED ORDER — FENTANYL 75 MCG/HR TD PT72: 150.0000 ug | MEDICATED_PATCH | TRANSDERMAL | 0 refills | Status: DC

## 2016-09-14 MED ORDER — DEXTROSE 5 % IV SOLN
2.0000 g | INTRAVENOUS | Status: DC
  Administered 2016-09-14: 2 g via INTRAVENOUS
  Filled 2016-09-14: qty 2

## 2016-09-14 MED ORDER — LIDOCAINE-EPINEPHRINE (PF) 2 %-1:200000 IJ SOLN
INTRAMUSCULAR | Status: AC
  Filled 2016-09-14: qty 20

## 2016-09-14 MED ORDER — DIPHENHYDRAMINE HCL 25 MG PO CAPS
25.0000 mg | ORAL_CAPSULE | Freq: Four times a day (QID) | ORAL | Status: DC | PRN
  Administered 2016-09-15: 25 mg via ORAL
  Filled 2016-09-14: qty 1

## 2016-09-14 MED ORDER — POTASSIUM CHLORIDE CRYS ER 20 MEQ PO TBCR
20.0000 meq | EXTENDED_RELEASE_TABLET | Freq: Every day | ORAL | Status: DC
  Administered 2016-09-15: 20 meq via ORAL
  Filled 2016-09-14: qty 1

## 2016-09-14 MED ORDER — HYDROMORPHONE HCL 4 MG PO TABS: 4.0000 mg | ORAL_TABLET | ORAL | 0 refills | Status: DC | PRN

## 2016-09-14 MED ORDER — FUROSEMIDE 40 MG PO TABS
40.0000 mg | ORAL_TABLET | Freq: Every day | ORAL | Status: DC
  Administered 2016-09-15: 40 mg via ORAL
  Filled 2016-09-14: qty 1

## 2016-09-14 MED ORDER — FUROSEMIDE 10 MG/ML IJ SOLN: 20.0000 mg | Freq: Once | INTRAMUSCULAR | Status: DC

## 2016-09-14 MED ORDER — SENNA 8.6 MG PO TABS: 2.0000 | ORAL_TABLET | Freq: Two times a day (BID) | ORAL | 0 refills | Status: DC

## 2016-09-14 MED ORDER — POLYETHYLENE GLYCOL 3350 17 G PO PACK: 17.0000 g | PACK | Freq: Every day | ORAL | 0 refills | Status: DC

## 2016-09-14 MED ORDER — PALBOCICLIB 125 MG PO CAPS: 125.0000 mg | ORAL_CAPSULE | Freq: Every day | ORAL | 0 refills | Status: DC

## 2016-09-14 MED ORDER — DOCUSATE SODIUM 100 MG PO CAPS: 100.0000 mg | ORAL_CAPSULE | Freq: Two times a day (BID) | ORAL | 0 refills | Status: DC

## 2016-09-14 MED ORDER — HYDROCORTISONE 2.5 % RE CREA: TOPICAL_CREAM | Freq: Two times a day (BID) | RECTAL | 0 refills | Status: DC

## 2016-09-14 MED ORDER — POTASSIUM CHLORIDE CRYS ER 20 MEQ PO TBCR: 20.0000 meq | EXTENDED_RELEASE_TABLET | Freq: Every day | ORAL | 0 refills | Status: DC

## 2016-09-14 MED ORDER — CEFPODOXIME PROXETIL 200 MG PO TABS
400.0000 mg | ORAL_TABLET | Freq: Two times a day (BID) | ORAL | Status: DC
  Administered 2016-09-14 – 2016-09-15 (×2): 400 mg via ORAL
  Filled 2016-09-14 (×3): qty 2

## 2016-09-14 MED ORDER — FUROSEMIDE 20 MG PO TABS: 20.0000 mg | ORAL_TABLET | Freq: Every day | ORAL | Status: DC

## 2016-09-14 MED ORDER — HYDROMORPHONE HCL 2 MG PO TABS
4.0000 mg | ORAL_TABLET | ORAL | Status: DC | PRN
  Administered 2016-09-14 – 2016-09-15 (×3): 4 mg via ORAL
  Filled 2016-09-14 (×3): qty 2

## 2016-09-14 MED ORDER — FUROSEMIDE 20 MG PO TABS: 20.0000 mg | ORAL_TABLET | Freq: Every day | ORAL | 0 refills | Status: DC

## 2016-09-14 MED ORDER — LORAZEPAM 0.5 MG PO TABS: 0.5000 mg | ORAL_TABLET | Freq: Two times a day (BID) | ORAL | 0 refills | Status: DC | PRN

## 2016-09-14 MED ORDER — CEFPODOXIME PROXETIL 200 MG PO TABS
400.0000 mg | ORAL_TABLET | Freq: Two times a day (BID) | ORAL | 0 refills | Status: DC
Start: 2016-09-14 — End: 2016-09-16

## 2016-09-14 NOTE — Progress Notes (Signed)
CRITICAL VALUE ALERT  Critical Value: WBC- 1.2  Date & Time Notied:  09/14/2016  Provider Notified: K.Shorr  Orders Received/Actions taken:

## 2016-09-14 NOTE — Progress Notes (Signed)
Patient presented to radiology department for paracentesis.  Patient recently had paracentesis 09/12/16 with 1.6 L removed.  Patient did have remaining fluid which was complex and extensively loculated.  Limited US Abdomen performed today; results dictated separately.  Complex fluid remains which is not amenable to paracentesis.   Patient upset that fluid will remain, but is understanding of her current medical status and limitations of procedure as she used to work as an Architect. Answered questions as able and encouraged her to discuss with medical team.  Brynda Greathouse, MS RD PA-C 3:06 PM

## 2016-09-14 NOTE — Progress Notes (Signed)
Pharmacy IV to PO conversion  The patient is receiving Benadryl and Protonix by the intravenous route.  Based on criteria approved by the Pharmacy and [place], the medication is being converted to the equivalent oral dose form.   No active GI bleeding or impaired absorption  Not s/p esophagectomy  Documented ability to take oral medications for > 24 hr  Plan to continue treatment for at least 1 day  If you have any questions about this conversion, please contact the Pharmacy Department (ext [phone]).  Thank you.  Reuel Boom, PharmD Pager: [phone] 09/14/2016, 9:40 AM

## 2016-09-14 NOTE — Progress Notes (Signed)
PHARMACY NOTE -  Rocephin  Pharmacy has been assisting with dosing of Rocephin for MSSA peritonitis. Dosage remains stable at 2g IV q24 hr and need for further dosage adjustment appears unlikely at present.    Will sign off at this time.  Please reconsult if a change in clinical status warrants re-evaluation of dosage.  Reuel Boom, PharmD, BCPS Pager: [phone] 09/14/2016, 7:54 AM

## 2016-09-14 NOTE — Progress Notes (Signed)
Daily Progress Note   Patient Name: Tamara Byrd       Date: 09/14/2016 DOB: 08-06-1965  Age: 51 y.o. MRN#: [id number] Attending Physician: Lavina Hamman, MD Primary Care Physician: Olena Mater, MD Admit Date: 09/09/2016  Reason for Consultation/Follow-up: Establishing goals of care, Non pain symptom management and Pain control  Subjective: I met with [name] this morning.  She reports that pain has been well controlled on PCA.  Reviewed PCA and she has used 7.34m in the last 24 hours, only 2.763min last 12 hours (which would be time period since I would anticipate fentanyl patch to be at steady state.)   Had BM following relistor.  Reports Dr. GuLindi Adieiscussed with her about staying on medication such as Relistor as OP.  Length of Stay: 4  Current Medications: Scheduled Meds:  . buPROPion  150 mg Oral Daily  . fentaNYL  150 mcg Transdermal Q72H  . hydrocortisone   Rectal BID  . HYDROmorphone   Intravenous Q4H  . pantoprazole (PROTONIX) IV  40 mg Intravenous QHS  . polyethylene glycol  17 g Oral Daily  . senna  2 tablet Oral BID    Continuous Infusions: . 0.9 % NaCl with KCl 20 mEq / L 75 mL/hr at 09/14/16 0316  . cefTRIAXone (ROCEPHIN)  IV      PRN Meds: diphenhydrAMINE, HYDROmorphone (DILAUDID) injection, lidocaine-EPINEPHrine, LORazepam, naloxone **AND** sodium chloride flush, ondansetron **OR** ondansetron (ZOFRAN) IV, sodium chloride flush  Physical Exam     General: Alert, awake, in no mild distress related to pain.  HEENT: No bruits, no goiter, no JVD Heart: Regular rate and rhythm. No murmur appreciated. Lungs: Good air movement, clear Abdomen: globally tender, distended, positive bowel sounds.  Ext: trace edema Skin: Warm and dry Neuro: Grossly intact,  nonfocal.      Vital Signs: BP 125/69 (BP Location: Left Leg)   Pulse (!) 105   Temp 97.9 F (36.6 C) (Oral)   Resp 16   Ht _0  (1.626 m)   Wt 65 kg (143 lb 4.8 oz)   LMP 11/23/2013 Comment: last period was very light, spotting the week before  SpO2 99%   BMI 24.60 kg/m  SpO2: SpO2: 99 % O2 Device: O2 Device: Not Delivered O2 Flow Rate: O2 Flow Rate (L/min): 2 L/min  Intake/output  summary:   Intake/Output Summary (Last 24 hours) at 09/14/16 1517 Last data filed at 09/14/16 0840  Gross per 24 hour  Intake           4787.5 ml  Output             2350 ml  Net           2437.5 ml   LBM: Last BM Date: 09/12/16 Baseline Weight: Weight: 65 kg (143 lb 4.8 oz) Most recent weight: Weight: 65 kg (143 lb 4.8 oz)       Palliative Assessment/Data:    Flowsheet Rows     Most Recent Value  Intake Tab  Referral Department  Hospitalist  Unit at Time of Referral  Med/Surg Unit  Palliative Care Primary Diagnosis  Cancer  Date Notified  09/09/16  Palliative Care Type  New Palliative care  Reason for referral  Pain  Date of Admission  09/09/16  Date first seen by Palliative Care  09/10/16  # of days Palliative referral response time  1 Day(s)  # of days IP prior to Palliative referral  0  Clinical Assessment  Palliative Performance Scale Score  50%  Pain Max last 24 hours  10  Pain Min Last 24 hours  6  Psychosocial & Spiritual Assessment  Palliative Care Outcomes  Patient/Family meeting held?  Yes  Who was at the meeting?  Patient      Patient Active Problem List   Diagnosis Date Noted  . Pancytopenia ([place]) 09/10/2016  . Intractable abdominal pain 09/09/2016  . Anxiety 09/09/2016  . Malignant ascites 09/09/2016  . Bone metastases ([place]) 08/27/2016  . Metastatic breast cancer ([place]) 07/11/2016  . 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 Depression     Palliative Care Assessment & Plan   Patient Profile: 51 y.o. female  with past medical history of metastatic breast  cancer, malignant ascites s/p peritoneal catheter placement, depression, anxiety admitted on 09/09/2016 with uncontrolled abdominal pain.  Palliative consulted for GOC and symptom management.   Recommendations/Plan: Pain: Plan for paracentesis today.  She would like to keep PCA until after paracentesis.  Discussed options for oral rescue medications with her.  She would like to continue with dilaudid rather than transition back to oxycodone.  I think that this is reasonable and recommend dilaudid PO 4-8 mg every 3 hours as needed.  Plan to transition to this following paracentesis.  D/w Dr. Posey Pronto. Constipation: Good effect with Relistor.  Added back senna and miralax.  Dr. Lindi Adie note reviewed, and I agree with his plan to start Relistor as OP.  Code Status:    Code Status Orders        Start     Ordered   09/09/16 2247  Do not attempt resuscitation (DNR)  Continuous    Question Answer Comment  In the event of cardiac or respiratory ARREST Do not call a "code blue"   In the event of cardiac or respiratory ARREST Do not perform Intubation, CPR, defibrillation or ACLS   In the event of cardiac or respiratory ARREST Use medication by any route, position, wound care, and other measures to relive pain and suffering. May use oxygen, suction and manual treatment of airway obstruction as needed for comfort.      09/09/16 2246    Code Status History    Date Active Date Inactive Code Status Order ID Comments User Context   09/09/2016  9:58 PM 09/09/2016 10:46 PM Full Code [id number]  Reubin Milan, MD  Inpatient       Prognosis:   Unable to determine  Discharge Planning:  To Be Determined  Care plan was discussed with patient, RN  Thank you for allowing the Palliative Medicine Team to assist in the care of this patient.   Total Time 30 Prolonged Time Billed No      Greater than 50%  of this time was spent counseling and coordinating care related to the above assessment and  plan.  Micheline Rough, MD  Please contact Palliative Medicine Team phone at [phone] for questions and concerns.

## 2016-09-14 NOTE — Progress Notes (Signed)
Patient approached  nurse on nursing station states " I couldn't take this anymore , get this thing out of me  ( IV line) . Patient was then disconnected from her IV lines. Will page IV nurse an continue to monitor.

## 2016-09-14 NOTE — Telephone Encounter (Signed)
Oral Chemotherapy Pharmacist Encounter  Follow-Up Form  Received call from patient with questions about Ibrance cycle 2 dose and start date.  Patient currently admitted with malignant ascites and SBP. Currently on antibiotics.  Case discussed with MD, inpatient CBCs reviewed, neutropenia, anemia, and thrombocytopenia noted.  Patient instructed to continue to hold Ibrance until seen in office after discharge from the hospital. Confirmed 09/20/16 appts with patient.  Will follow-up CBC at office visit on 8/27 and send prescription for appropriate dose based on ANC.  Patient knows to call the office with questions or concerns. Oral Oncology Clinic will continue to follow.  Thank you,  Johny Drilling, PharmD, BCPS, BCOP 09/14/2016 4:19 PM [facility] [phone]

## 2016-09-14 NOTE — Progress Notes (Signed)
[facility] Progress Note  Patient: Tamara Byrd VWU:[id number]   PCP: Olena Mater, MD DOB: [birth year]   DOA: 09/09/2016   DOS: 09/14/2016   Date of Service: the patient was seen and examined on 09/14/2016  Subjective: no nausea no vomiting but tolerating oral diet. No diarrhea today.  Brief hospital course: Pt. with PMH of Metastatic breast cancer with recurrence, malignant ascites and S/P peritoneal catheter placement, depression, anxiety; admitted on 09/09/2016, presented with complaint of severe abdominal pain, was found to have abdominal pain related to cancer uncontrolled. Also has SBP with staph aureus Currently further plan is continue pain control.  Assessment and Plan: 1. Intractable abdominal pain. Abdominal metastasis with malignant ascites.  Increase Duragesic patch to 75 g every 72 hours. Hydromorphone 1 mg IVPB every 30 minutes as needed. Antiemetics as needed. Her free air could also represent air from the catheter versus contain bowel perforation although the patient would not be a good candidate for any surgical evaluation due to her metastatic disease. Underwent paracentesis which improved her symptoms but since yesterday the patient still continues to complain about abdominal distention and adnexa which ultrasound abdomen to rule out any further worsening of ascites. Palliative medicine consulted.  Appreciate input.  Treated with Dilaudid PCA for pain control. Now transitioned to by mouth. We'll monitor overnight for pain control on by mouth medications.  Patient has significant ascites with loculation and therefore catheter placed in one area would not be effective in the long-term and possibly likely has led to her current SBP therefore her peritoneal catheter was removed.  SBP. MSSA infection Peritoneal fluid analysis shows increased WBC neutrophil predominance. LDH elevated. Culture is growing Staphylococcus aureus, sensitivities are currently  pending. Peritoneal drainage catheter removed. Patient was treated with IV vancomycin, IV cefepime. Currently transitioned to oral Ceftin will finish 7 day treatment course for improvement.  Active Problems:   Metastatic breast cancer [facility]) The patient is currently getting palliative chemotherapy. Will have to stop Ibrance due to abdominal pain as well as thrombocytopenia. Recommended to stop on discharge until cleared by Dr. Lindi Adie    Depression Continue Wellbutrin. Anxiety control.    Anxiety When necessary Ativan.    Malignant ascites Per patient, she has not been able to drain the peritoneal catheter at home. S/P paracentesis 1. Significantly loculated effusion therefore permanent catheter will not be a solution for the patient. It is actually a source of infection for patient currently suspected SBP and therefore it has to be removed. repeat attempt for paracentesis was not successful as the patient does not have large pocket, good enough to be drained.    Pancytopenia ([place]) Secondary to chemotherapy. Currently holding chemotherapy for the same. Monitor daily CBC.  Pedal edema. Hypoalbuminemia. Patient has protein calorie malnutrition in the setting of cancer. Abdomen lI.2. Has ascites as well as pedal edema. We will provide oral Lasix and monitor for response. Continue compression stockings.  Diet: soft diet DVT Prophylaxis: mechanical compression device  Advance goals of care discussion: DNR DNI  Family Communication: no family was present at bedside, at the time of interview.   Disposition:  Discharge to home tomorrow  Consultants: Oncology, general surgery, palliative medicine, interventional radiology Procedures: PICC line placement due to poor IV access, ultrasound paracentesis  Antibiotics: Anti-infectives    Start     Dose/Rate Route Frequency Ordered Stop   09/14/16 2200  cefpodoxime (VANTIN) tablet 400 mg     400 mg Oral Every 12 hours 09/14/16  1056  09/14/16 1000  cefTRIAXone (ROCEPHIN) 2 g in dextrose 5 % 50 mL IVPB  Status:  Discontinued     2 g 100 mL/hr over 30 Minutes Intravenous Every 24 hours 09/14/16 0753 09/14/16 1056   09/14/16 0000  cefpodoxime (VANTIN) 200 MG tablet     400 mg Oral Every 12 hours 09/14/16 1722 09/18/16 2359   09/13/16 2200  vancomycin (VANCOCIN) IVPB 750 mg/150 ml premix  Status:  Discontinued     750 mg 150 mL/hr over 60 Minutes Intravenous Every 12 hours 09/12/16 1257 09/14/16 0743   09/13/16 0400  ceFEPIme (MAXIPIME) 2 g in dextrose 5 % 50 mL IVPB  Status:  Discontinued     2 g 100 mL/hr over 30 Minutes Intravenous Every 12 hours 09/12/16 1820 09/14/16 0743   09/12/16 2200  ceFEPIme (MAXIPIME) 2 g in dextrose 5 % 50 mL IVPB  Status:  Discontinued     2 g 100 mL/hr over 30 Minutes Intravenous Every 12 hours 09/12/16 1146 09/12/16 1820   09/12/16 1300  vancomycin (VANCOCIN) IVPB 1000 mg/200 mL premix     1,000 mg 200 mL/hr over 60 Minutes Intravenous  Once 09/12/16 1257 09/12/16 1543   09/12/16 1200  ceFEPIme (MAXIPIME) 2 g in dextrose 5 % 50 mL IVPB     2 g 100 mL/hr over 30 Minutes Intravenous  Once 09/12/16 1134 09/12/16 1704       Objective: Physical Exam: Vitals:   09/14/16 1000 09/14/16 1016 09/14/16 1228 09/14/16 1400  BP: 134/76   (!) 128/55  Pulse: (!) 104   (!) 102  Resp: 17 16 16 18   Temp: 98.9 F (37.2 C)   98.1 F (36.7 C)  TempSrc: Oral   Oral  SpO2: 99% 99% 99% 98%  Weight:      Height:        Intake/Output Summary (Last 24 hours) at 09/14/16 1934 Last data filed at 09/14/16 1800  Gross per 24 hour  Intake           4837.5 ml  Output             2800 ml  Net           2037.5 ml   Filed Weights   09/10/16 0618  Weight: 65 kg (143 lb 4.8 oz)   General: Alert, Awake and Oriented to Time, Place and Person. Appear in moderate distress, affect appropriate Eyes: PERRL, Conjunctiva normal ENT: Oral Mucosa clear moist. Neck: no JVD, no Abnormal Mass Or  lumps Cardiovascular: S1 and S2 Present, no Murmur, Peripheral Pulses Present Respiratory: normal respiratory effort, Bilateral Air entry equal and Decreased, no use of accessory muscle, Clear to Auscultation, no Crackles, no wheezes Abdomen: Bowel Sound present, Soft and moderate diffuse tenderness, skin: no redness, no Rash, no induration Extremities: trace Pedal edema, no calf tenderness Neurologic: Grossly no focal neuro deficit. Bilaterally Equal motor strength  Data Reviewed: CBC:  Recent Labs Lab 09/10/16 0517 09/11/16 1225 09/12/16 0515 09/13/16 0532 09/14/16 0300  WBC 2.8* 2.3* 1.8* 1.8* 1.2*  NEUTROABS  --  2.0 1.5* 1.4* 0.9*  HGB 9.7* 8.6* 7.9* 7.9* 7.7*  HCT 29.6* 26.1* 24.2* 24.3* 23.2*  MCV 91.6 91.6 92.0 91.7 93.5  PLT 72* 60* 49* 45* 40*   Basic Metabolic Panel:  Recent Labs Lab 09/10/16 0517 09/11/16 1225 09/12/16 0515 09/13/16 0532 09/14/16 0300  NA 137 138 139 137 139  K 3.3* 3.8 3.8 3.6 3.3*  CL 105 106 107 105 106  CO2 23 24 24 24 25   GLUCOSE 94 85 99 109* 108*  BUN <5* <5* <5* <5* <5*  CREATININE 0.51 0.42* 0.43* 0.44 0.45  CALCIUM 6.8* 6.6* 6.3* 6.4* 6.6*  MG  --   --  1.8  --   --     Liver Function Tests:  Recent Labs Lab 09/09/16 1413 09/11/16 1225  AST 23 14*  ALT 19 13*  ALKPHOS 81 80  BILITOT 0.5 0.6  PROT 4.7* 4.5*  ALBUMIN 1.3* 1.2*    Recent Labs Lab 09/09/16 1413  LIPASE 16   No results for input(s): AMMONIA in the last 168 hours. Coagulation Profile: No results for input(s): INR, PROTIME in the last 168 hours. Cardiac Enzymes: No results for input(s): CKTOTAL, CKMB, CKMBINDEX, TROPONINI in the last 168 hours. BNP (last 3 results) No results for input(s): PROBNP in the last 8760 hours. CBG: No results for input(s): GLUCAP in the last 168 hours. Studies: US Abdomen Limited  Result Date: 09/14/2016 CLINICAL DATA:  Evaluate for paracentesis EXAM: LIMITED ABDOMEN ULTRASOUND FOR ASCITES TECHNIQUE: Limited  ultrasound survey for ascites was performed in all four abdominal quadrants. COMPARISON:  None. FINDINGS: Imaging in the abdomen demonstrates a highly loculated ascites. There is no large pocket available for decompression IMPRESSION: See above comments. Electronically Signed   By: Marybelle Killings M.D.   On: 09/14/2016 15:12    Scheduled Meds: . buPROPion  150 mg Oral Daily  . cefpodoxime  400 mg Oral Q12H  . fentaNYL  150 mcg Transdermal Q72H  . furosemide  40 mg Oral Daily  . hydrocortisone   Rectal BID  . pantoprazole  40 mg Oral QHS  . polyethylene glycol  17 g Oral Daily  . [START ON 09/15/2016] potassium chloride  20 mEq Oral Daily  . potassium chloride  40 mEq Oral Once  . senna  2 tablet Oral BID   Continuous Infusions:  PRN Meds: diphenhydrAMINE, heparin lock flush, HYDROmorphone, LORazepam, ondansetron **OR** ondansetron (ZOFRAN) IV, sodium chloride flush  Time spent: 35 minutes  Author: Berle Mull, MD [facility] Pager: [phone] 09/14/2016 7:34 PM  If 7PM-7AM, please contact night-coverage at [email or web address], password [facility]

## 2016-09-14 NOTE — Progress Notes (Signed)
Patient was connected back to her Iv line,IV fluids,IV antibiotic and PCA dilaudid was restarted. We will continue to monitor.

## 2016-09-14 NOTE — Progress Notes (Signed)
Patient's PICC line keep beeping and saying occluded paged IV nurse . We will continue to monitor

## 2016-09-15 LAB — CBC WITH DIFFERENTIAL/PLATELET
BASOS ABS: 0 10*3/uL (ref 0.0–0.1)
Basophils Relative: 0 %
EOS ABS: 0 10*3/uL (ref 0.0–0.7)
EOS PCT: 1 %
HCT: 23 % — ABNORMAL LOW (ref 36.0–46.0)
Hemoglobin: 7.4 g/dL — ABNORMAL LOW (ref 12.0–15.0)
LYMPHS ABS: 0.3 10*3/uL — AB (ref 0.7–4.0)
Lymphocytes Relative: 27 %
MCH: 29.7 pg (ref 26.0–34.0)
MCHC: 32.2 g/dL (ref 30.0–36.0)
MCV: 92.4 fL (ref 78.0–100.0)
MONO ABS: 0.1 10*3/uL (ref 0.1–1.0)
Monocytes Relative: 11 %
NEUTROS PCT: 61 %
Neutro Abs: 0.6 10*3/uL — ABNORMAL LOW (ref 1.7–7.7)
Platelets: 42 10*3/uL — ABNORMAL LOW (ref 150–400)
RBC: 2.49 MIL/uL — AB (ref 3.87–5.11)
RDW: 13.7 % (ref 11.5–15.5)
WBC: 1 10*3/uL — AB (ref 4.0–10.5)

## 2016-09-15 LAB — BASIC METABOLIC PANEL
ANION GAP: 8 (ref 5–15)
BUN: <5 mg/dL — ABNORMAL LOW (ref 6–20)
CALCIUM: 6.6 mg/dL — AB (ref 8.9–10.3)
CO2: 26 mmol/L (ref 22–32)
Chloride: 107 mmol/L (ref 101–111)
Creatinine, Ser: 0.35 mg/dL — ABNORMAL LOW (ref 0.44–1.00)
GLUCOSE: 81 mg/dL (ref 65–99)
POTASSIUM: 3.6 mmol/L (ref 3.5–5.1)
SODIUM: 141 mmol/L (ref 135–145)

## 2016-09-15 MED ORDER — POTASSIUM CHLORIDE CRYS ER 20 MEQ PO TBCR
40.0000 meq | EXTENDED_RELEASE_TABLET | Freq: Two times a day (BID) | ORAL | Status: AC
Start: 2016-09-15 — End: 2016-09-15
  Administered 2016-09-15 (×2): 40 meq via ORAL
  Filled 2016-09-15 (×2): qty 2

## 2016-09-15 MED ORDER — FUROSEMIDE 10 MG/ML IJ SOLN
40.0000 mg | Freq: Two times a day (BID) | INTRAMUSCULAR | Status: DC
  Administered 2016-09-15 – 2016-09-16 (×2): 40 mg via INTRAVENOUS
  Filled 2016-09-15 (×2): qty 4

## 2016-09-15 MED ORDER — DEXTROSE 5 % IV SOLN
2.0000 g | INTRAVENOUS | Status: DC
  Administered 2016-09-15: 2 g via INTRAVENOUS
  Filled 2016-09-15 (×3): qty 2

## 2016-09-15 NOTE — Progress Notes (Signed)
Daily Progress Note   Patient Name: Tamara Byrd       Date: 09/15/2016 DOB: September 24, 1965  Age: 51 y.o. MRN#: [id number] Attending Physician: Domenic Polite, MD Primary Care Physician: Olena Mater, MD Admit Date: 09/09/2016  Reason for Consultation/Follow-up: Establishing goals of care, Non pain symptom management and Pain control  Subjective: I met with Tamara Byrd this afternoon.  She reports that pain has been well controlled off of PCA, but she is emotionally overwhelmed right now.    We discussed events of last 24 hours including fact she did not get paracentesis.  She became tearful and reports knowing that "things were going to get worse, but I didn't think it would be this quick."  She also said that she "just want one thing to go right."  Length of Stay: 5  Current Medications: Scheduled Meds:  . buPROPion  150 mg Oral Daily  . fentaNYL  150 mcg Transdermal Q72H  . furosemide  40 mg Intravenous Q12H  . hydrocortisone   Rectal BID  . pantoprazole  40 mg Oral QHS  . polyethylene glycol  17 g Oral Daily  . potassium chloride  40 mEq Oral BID  . senna  2 tablet Oral BID    Continuous Infusions: . cefTRIAXone (ROCEPHIN)  IV 2 g (09/15/16 1437)    PRN Meds: diphenhydrAMINE, heparin lock flush, HYDROmorphone, LORazepam, ondansetron **OR** ondansetron (ZOFRAN) IV, sodium chloride flush  Physical Exam     General: Alert, awake, in no distress.  HEENT: No bruits, no goiter, no JVD Heart: Regular rate and rhythm. No murmur appreciated. Lungs: Good air movement, clear Abdomen: globally tender, distended, positive bowel sounds.  Ext: + LE edema (worse from yesterday) Skin: Warm and dry Neuro: Grossly intact, nonfocal.      Vital Signs: BP 129/69 (BP Location: Left Arm)    Pulse (!) 110   Temp 97.7 F (36.5 C) (Oral)   Resp 18   Ht _0  (1.626 m)   Wt 65 kg (143 lb 4.8 oz)   LMP 11/23/2013 Comment: last period was very light, spotting the week before  SpO2 99%   BMI 24.60 kg/m  SpO2: SpO2: 99 % O2 Device: O2 Device: Not Delivered O2 Flow Rate: O2 Flow Rate (L/min): 2 L/min  Intake/output summary:   Intake/Output Summary (Last 24 hours)  at 09/15/16 1526 Last data filed at 09/15/16 1410  Gross per 24 hour  Intake             2700 ml  Output             5600 ml  Net            -2900 ml   LBM: Last BM Date: 09/14/16 Baseline Weight: Weight: 65 kg (143 lb 4.8 oz) Most recent weight: Weight: 65 kg (143 lb 4.8 oz)       Palliative Assessment/Data:    Flowsheet Rows     Most Recent Value  Intake Tab  Referral Department  Hospitalist  Unit at Time of Referral  Med/Surg Unit  Palliative Care Primary Diagnosis  Cancer  Date Notified  09/09/16  Palliative Care Type  New Palliative care  Reason for referral  Pain  Date of Admission  09/09/16  Date first seen by Palliative Care  09/10/16  # of days Palliative referral response time  1 Day(s)  # of days IP prior to Palliative referral  0  Clinical Assessment  Palliative Performance Scale Score  50%  Pain Max last 24 hours  10  Pain Min Last 24 hours  6  Psychosocial & Spiritual Assessment  Palliative Care Outcomes  Patient/Family meeting held?  Yes  Who was at the meeting?  Patient      Patient Active Problem List   Diagnosis Date Noted  . Pancytopenia ([place]) 09/10/2016  . Intractable abdominal pain 09/09/2016  . Anxiety 09/09/2016  . Malignant ascites 09/09/2016  . Bone metastases ([place]) 08/27/2016  . Metastatic breast cancer ([place]) 07/11/2016  . 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 Depression     Palliative Care Assessment & Plan   Patient Profile: 51 y.o. female  with past medical history of metastatic breast cancer, malignant ascites s/p peritoneal catheter placement, depression,  anxiety admitted on 09/09/2016 with uncontrolled abdominal pain.  Palliative consulted for GOC and symptom management.   Recommendations/Plan: Pain: transitioned to oral rescue medications with good control of pain. Would continue dilaudid PO 4-8 mg every 3 hours as needed.   Constipation:  Added back senna and miralax.  Dr. Lindi Adie note reviewed, and I agree with his plan to start Relistor as OP if she is not having good results with current bowel regimen. Anxiety: on wellbutrin. Ativan as needed. Goals of care: Tamara Byrd reports being overwhelmed today by the fact that paracentesis is not going to manage her fluid moving forward.  We discussed fluid balance, albumin, etc.  She wants guidance on nutrition moving forward, and I placed dietary consult today.  She understands plan for diuretics, but is distraught over "how quickly things have changed."  She needs to discuss further with Dr. Lindi Adie prior to making any other changes to long-term care plan.  Palliative to continue psychosocial support.  Code Status:    Code Status Orders        Start     Ordered   09/09/16 2247  Do not attempt resuscitation (DNR)  Continuous    Question Answer Comment  In the event of cardiac or respiratory ARREST Do not call a "code blue"   In the event of cardiac or respiratory ARREST Do not perform Intubation, CPR, defibrillation or ACLS   In the event of cardiac or respiratory ARREST Use medication by any route, position, wound care, and other measures to relive pain and suffering. May use oxygen, suction and manual treatment of airway obstruction  as needed for comfort.      09/09/16 2246    Code Status History    Date Active Date Inactive Code Status Order ID Comments User Context   09/09/2016  9:58 PM 09/09/2016 10:46 PM Full Code [id number]  Reubin Milan, MD Inpatient       Prognosis:   Unable to determine  Discharge Planning:  To Be Determined  Care plan was discussed with patient,  RN  Thank you for allowing the Palliative Medicine Team to assist in the care of this patient.   Total Time 30 Prolonged Time Billed No      Greater than 50%  of this time was spent counseling and coordinating care related to the above assessment and plan.  Micheline Rough, MD  Please contact Palliative Medicine Team phone at [phone] for questions and concerns.

## 2016-09-15 NOTE — Progress Notes (Addendum)
[facility] Progress Note  Patient: Tamara Byrd LTJ:[id number]   PCP: Olena Mater, MD DOB: 1965/11/30   DOA: 09/09/2016   DOS: 09/15/2016   Date of Service: the patient was seen and examined on 09/15/2016  Subjective: no nausea no vomiting but tolerating oral diet. No diarrhea today.  Brief hospital course: Pt. with PMH of Metastatic breast cancer with recurrence, malignant ascites and S/P peritoneal catheter placement, depression, anxiety; admitted on 09/09/2016, presented with complaint of severe abdominal pain, was found to have abdominal pain related to cancer uncontrolled. Also has SBP with staph aureus Currently further plan is continue pain control.  Assessment and Plan: 1. Increasing malignant ascites with MSSA SBP  -Status post paracentesis 2, yielding 1.5 and 1.6 L each time -Ascitic fluid culture with MSSA -Chronic peritoneal catheter for malignant ascites, however given complex loculated fluid collections which is not amenable to drainage via this catheter, peritoneal catheter finally removed on 8/20 by IR. -Status post 3 days of IV cefepime and then changed to oral Vantin yesterday I called and discussed case with Dr. Megan Salon infectious disease given complexity of situation and SBP with MSSA, complex loculated fluid collections will treat for at least 2 weeks with IV ceftriaxone 2 g daily via PICC line   2. Abd pain -Due to malignant ascites, complex fluid collection -She was noted to have some free air on her initial CT scan, this was felt to be related to her peritoneal catheter and not from a bowel perforation per surgical evaluation -Was on a Dilaudid PCA for pain controlled now controlled on fentanyl patch and oral hydromorphone -Appreciate palliative care assistance  3.  Metastatic breast cancer [facility]) - Had been on palliative chemotherapy. - Currently on hold due to infection - Overall prognosis appears to be quite poor, follow-up with Dr. Lindi Adie  4.   Depression Continue Wellbutrin. Anxiety control.  5.  Anxiety When necessary Ativan.  6.  anemia/Pancytopenia ([place]) -Due to acute illness and metastatic cancer, chemotherapy Hemoglobin has trended down to 7.4 today we'll transfuse 1 unit PRBC -Check CBC in a.m.   7. Edema/third spacing due to hypoalbuminemia -Last albumin was 1.2 -Encouraged increased protein intake, IV Lasix today -continue compression stockings.  Diet: soft diet DVT Prophylaxis: mechanical compression device due to thrombocytopenia  Advance goals of care discussion: DNR DNI  Family Communication: no family was present at bedside, at the time of interview.   Disposition: Home tomorrow if stable  Consultants: Oncology, general surgery, palliative medicine, interventional radiology Procedures: PICC line placement due to poor IV access, ultrasound paracentesis  Antibiotics: Anti-infectives    Start     Dose/Rate Route Frequency Ordered Stop   09/15/16 1400  cefTRIAXone (ROCEPHIN) 2 g in dextrose 5 % 50 mL IVPB     2 g 100 mL/hr over 30 Minutes Intravenous Every 24 hours 09/15/16 1222     09/14/16 2200  cefpodoxime (VANTIN) tablet 400 mg  Status:  Discontinued     400 mg Oral Every 12 hours 09/14/16 1056 09/15/16 1222   09/14/16 1000  cefTRIAXone (ROCEPHIN) 2 g in dextrose 5 % 50 mL IVPB  Status:  Discontinued     2 g 100 mL/hr over 30 Minutes Intravenous Every 24 hours 09/14/16 0753 09/14/16 1056   09/14/16 0000  cefpodoxime (VANTIN) 200 MG tablet     400 mg Oral Every 12 hours 09/14/16 1722 09/18/16 2359   09/13/16 2200  vancomycin (VANCOCIN) IVPB 750 mg/150 ml premix  Status:  Discontinued  750 mg 150 mL/hr over 60 Minutes Intravenous Every 12 hours 09/12/16 1257 09/14/16 0743   09/13/16 0400  ceFEPIme (MAXIPIME) 2 g in dextrose 5 % 50 mL IVPB  Status:  Discontinued     2 g 100 mL/hr over 30 Minutes Intravenous Every 12 hours 09/12/16 1820 09/14/16 0743   09/12/16 2200  ceFEPIme (MAXIPIME) 2 g in  dextrose 5 % 50 mL IVPB  Status:  Discontinued     2 g 100 mL/hr over 30 Minutes Intravenous Every 12 hours 09/12/16 1146 09/12/16 1820   09/12/16 1300  vancomycin (VANCOCIN) IVPB 1000 mg/200 mL premix     1,000 mg 200 mL/hr over 60 Minutes Intravenous  Once 09/12/16 1257 09/12/16 1543   09/12/16 1200  ceFEPIme (MAXIPIME) 2 g in dextrose 5 % 50 mL IVPB     2 g 100 mL/hr over 30 Minutes Intravenous  Once 09/12/16 1134 09/12/16 1704       Objective: Physical Exam: Vitals:   09/14/16 1400 09/14/16 2133 09/15/16 0655 09/15/16 1400  BP: (!) 128/55 140/66 129/60 129/69  Pulse: (!) 102 (!) 102 (!) 111 (!) 110  Resp: 18 18 18 18   Temp: 98.1 F (36.7 C) 98 F (36.7 C) 97.8 F (36.6 C) 97.7 F (36.5 C)  TempSrc: Oral Oral Other (Comment) Oral  SpO2: 98% 98% 98% 99%  Weight:      Height:        Intake/Output Summary (Last 24 hours) at 09/15/16 1508 Last data filed at 09/15/16 1410  Gross per 24 hour  Intake             2700 ml  Output             5600 ml  Net            -2900 ml   Filed Weights   09/10/16 0618  Weight: 65 kg (143 lb 4.8 oz)   Gen: Awake, Alert, Oriented X 3, Chronically ill female HEENT: PERRLA, Neck supple, no JVD Lungs: Good air movement bilaterally, CTAB CVS: RRR,No Gallops,Rubs or new Murmurs Abd: soft, mildly distended, mild diffuse tenderness, bowel sounds present  Extremities: 2+ bilateral lower extremity edema Skin: no new rashes   Data Reviewed: CBC:  Recent Labs Lab 09/11/16 1225 09/12/16 0515 09/13/16 0532 09/14/16 0300 09/15/16 0448  WBC 2.3* 1.8* 1.8* 1.2* 1.0*  NEUTROABS 2.0 1.5* 1.4* 0.9* 0.6*  HGB 8.6* 7.9* 7.9* 7.7* 7.4*  HCT 26.1* 24.2* 24.3* 23.2* 23.0*  MCV 91.6 92.0 91.7 93.5 92.4  PLT 60* 49* 45* 40* 42*   Basic Metabolic Panel:  Recent Labs Lab 09/11/16 1225 09/12/16 0515 09/13/16 0532 09/14/16 0300 09/15/16 0448  NA 138 139 137 139 141  K 3.8 3.8 3.6 3.3* 3.6  CL 106 107 105 106 107  CO2 24 24 24 25 26     GLUCOSE 85 99 109* 108* 81  BUN <5* <5* <5* <5* <5*  CREATININE 0.42* 0.43* 0.44 0.45 0.35*  CALCIUM 6.6* 6.3* 6.4* 6.6* 6.6*  MG  --  1.8  --   --   --     Liver Function Tests:  Recent Labs Lab 09/09/16 1413 09/11/16 1225  AST 23 14*  ALT 19 13*  ALKPHOS 81 80  BILITOT 0.5 0.6  PROT 4.7* 4.5*  ALBUMIN 1.3* 1.2*    Recent Labs Lab 09/09/16 1413  LIPASE 16   No results for input(s): AMMONIA in the last 168 hours. Coagulation Profile: No results for input(s): INR,  PROTIME in the last 168 hours. Cardiac Enzymes: No results for input(s): CKTOTAL, CKMB, CKMBINDEX, TROPONINI in the last 168 hours. BNP (last 3 results) No results for input(s): PROBNP in the last 8760 hours. CBG: No results for input(s): GLUCAP in the last 168 hours. Studies: No results found.  Scheduled Meds: . buPROPion  150 mg Oral Daily  . fentaNYL  150 mcg Transdermal Q72H  . furosemide  40 mg Intravenous Q12H  . hydrocortisone   Rectal BID  . pantoprazole  40 mg Oral QHS  . polyethylene glycol  17 g Oral Daily  . potassium chloride  40 mEq Oral BID  . senna  2 tablet Oral BID   Continuous Infusions: . cefTRIAXone (ROCEPHIN)  IV 2 g (09/15/16 1437)   PRN Meds: diphenhydrAMINE, heparin lock flush, HYDROmorphone, LORazepam, ondansetron **OR** ondansetron (ZOFRAN) IV, sodium chloride flush  Time spent: 35 minutes  Author: Domenic Polite, MD [facility] Page via [email or web address], password [email or web address]  09/15/2016 3:08 PM  If 7PM-7AM, please contact night-coverage at [email or web address], password [facility]

## 2016-09-16 LAB — COMPREHENSIVE METABOLIC PANEL
ALT: 16 U/L (ref 14–54)
AST: 25 U/L (ref 15–41)
Albumin: 1.3 g/dL — ABNORMAL LOW (ref 3.5–5.0)
Alkaline Phosphatase: 93 U/L (ref 38–126)
Anion gap: 9 (ref 5–15)
CHLORIDE: 104 mmol/L (ref 101–111)
CO2: 27 mmol/L (ref 22–32)
CREATININE: 0.38 mg/dL — AB (ref 0.44–1.00)
Calcium: 6.7 mg/dL — ABNORMAL LOW (ref 8.9–10.3)
GFR calc Af Amer: >60 mL/min (ref >60)
GLUCOSE: 78 mg/dL (ref 65–99)
Potassium: 4 mmol/L (ref 3.5–5.1)
SODIUM: 140 mmol/L (ref 135–145)
Total Bilirubin: 0.6 mg/dL (ref 0.3–1.2)
Total Protein: 4.8 g/dL — ABNORMAL LOW (ref 6.5–8.1)

## 2016-09-16 LAB — CBC WITH DIFFERENTIAL/PLATELET
BASOS PCT: 1 %
Basophils Absolute: 0 10*3/uL (ref 0.0–0.1)
EOS ABS: 0 10*3/uL (ref 0.0–0.7)
Eosinophils Relative: 0 %
HCT: 23 % — ABNORMAL LOW (ref 36.0–46.0)
Hemoglobin: 7.4 g/dL — ABNORMAL LOW (ref 12.0–15.0)
LYMPHS PCT: 27 %
Lymphs Abs: 0.3 10*3/uL — ABNORMAL LOW (ref 0.7–4.0)
MCH: 29.7 pg (ref 26.0–34.0)
MCHC: 32.2 g/dL (ref 30.0–36.0)
MCV: 92.4 fL (ref 78.0–100.0)
Monocytes Absolute: 0.2 10*3/uL (ref 0.1–1.0)
Monocytes Relative: 15 %
NEUTROS PCT: 57 %
Neutro Abs: 0.6 10*3/uL — ABNORMAL LOW (ref 1.7–7.7)
Platelets: 42 10*3/uL — ABNORMAL LOW (ref 150–400)
RBC: 2.49 MIL/uL — ABNORMAL LOW (ref 3.87–5.11)
RDW: 13.6 % (ref 11.5–15.5)
WBC: 1.1 10*3/uL — CL (ref 4.0–10.5)

## 2016-09-16 LAB — CBC
HEMATOCRIT: 24.7 % — AB (ref 36.0–46.0)
Hemoglobin: 8.1 g/dL — ABNORMAL LOW (ref 12.0–15.0)
MCH: 30.6 pg (ref 26.0–34.0)
MCHC: 32.8 g/dL (ref 30.0–36.0)
MCV: 93.2 fL (ref 78.0–100.0)
Platelets: 44 10*3/uL — ABNORMAL LOW (ref 150–400)
RBC: 2.65 MIL/uL — ABNORMAL LOW (ref 3.87–5.11)
RDW: 13.7 % (ref 11.5–15.5)
WBC: 1.4 10*3/uL — CL (ref 4.0–10.5)

## 2016-09-16 LAB — PREPARE RBC (CROSSMATCH)

## 2016-09-16 LAB — ABO/RH: ABO/RH(D): A POS

## 2016-09-16 MED ORDER — POTASSIUM CHLORIDE CRYS ER 20 MEQ PO TBCR: 40.0000 meq | EXTENDED_RELEASE_TABLET | Freq: Every day | ORAL | 0 refills | Status: DC

## 2016-09-16 MED ORDER — PANTOPRAZOLE SODIUM 40 MG PO TBEC: 40.0000 mg | DELAYED_RELEASE_TABLET | Freq: Every day | ORAL | 0 refills | Status: DC

## 2016-09-16 MED ORDER — SODIUM CHLORIDE 0.9 % IV SOLN: Freq: Once | INTRAVENOUS | Status: DC

## 2016-09-16 MED ORDER — METHYLNALTREXONE BROMIDE 150 MG PO TABS
150.0000 mg | ORAL_TABLET | Freq: Every day | ORAL | 0 refills | Status: DC
Start: 2016-09-16 — End: 2016-10-04

## 2016-09-16 MED ORDER — SODIUM CHLORIDE 0.9% FLUSH: 10.0000 mL | INTRAVENOUS | Status: DC | PRN

## 2016-09-16 MED ORDER — DIPHENHYDRAMINE HCL 25 MG PO CAPS: 25.0000 mg | ORAL_CAPSULE | Freq: Every evening | ORAL | Status: DC | PRN

## 2016-09-16 MED ORDER — LORAZEPAM 0.5 MG PO TABS: 0.5000 mg | ORAL_TABLET | Freq: Two times a day (BID) | ORAL | 0 refills | Status: DC | PRN

## 2016-09-16 MED ORDER — ONDANSETRON HCL 8 MG PO TABS: 8.0000 mg | ORAL_TABLET | Freq: Three times a day (TID) | ORAL | 0 refills | Status: DC | PRN

## 2016-09-16 MED ORDER — DEXTROSE 5 % IV SOLN: 2.0000 g | INTRAVENOUS | 0 refills | Status: AC

## 2016-09-16 MED ORDER — FENTANYL 75 MCG/HR TD PT72: 150.0000 ug | MEDICATED_PATCH | TRANSDERMAL | 0 refills | Status: DC

## 2016-09-16 MED ORDER — FUROSEMIDE 40 MG PO TABS: 40.0000 mg | ORAL_TABLET | Freq: Two times a day (BID) | ORAL | 0 refills | Status: DC

## 2016-09-16 MED ORDER — HYDROMORPHONE HCL 4 MG PO TABS: 4.0000 mg | ORAL_TABLET | ORAL | 0 refills | Status: DC | PRN

## 2016-09-16 MED ORDER — HEPARIN SOD (PORK) LOCK FLUSH 100 UNIT/ML IV SOLN
250.0000 [IU] | INTRAVENOUS | Status: AC | PRN
  Administered 2016-09-16: 15:00:00

## 2016-09-16 NOTE — Discharge Summary (Signed)
Physician Discharge Summary  Tamara Byrd TJQ:[id number] DOB: 27-Oct-1965 DOA: 09/09/2016  PCP: Olena Mater, MD  Admit date: 09/09/2016 Discharge date: 09/16/2016  Time spent: 35 minutes  Recommendations for Outpatient Follow-up:  1. Dr.Gudena 8/27 -needs further discussions on Goals of Care 2. Needs Bmet checked at FU, since started diuretics for Third spacing/edema and CBC for neutropenia 3. IV Ceftriaxone via PICC line till 09/29/16, please DC PICC after this  Discharge Diagnoses:  Principal Problem:   Intractable abdominal pain   MSSA SBP   Malignant ascites   Loculated malignant ascites   Depression   Metastatic breast cancer ([facility])   Anxiety   Pancytopenia (HCC)   Neutropenia   Severe malnutrition   Third spacing/edema from hypoalbuminemia   Discharge Condition: stable  Diet recommendation: low sodium, high protein diet  Filed Weights   09/10/16 0618  Weight: 65 kg (143 lb 4.8 oz)    History of present illness:  Pt. with PMH of Metastatic breast cancer with recurrence, malignant ascites and S/P peritoneal catheter placement, depression, anxiety; admitted on 09/09/2016, presented with complaint of severe abdominal pain, was found to have abdominal pain related to loculated malignant ascites and SBP  Hospital Course:  1. Increasing malignant ascites with MSSA SBP  -Status post paracentesis 2, yielding 1.5 and 1.6 L each time -Ascitic fluid culture grew MSSA -Pt had chronic peritoneal catheter for malignant ascites, however given complex loculated fluid collections which is not amenable to drainage via this catheter, peritoneal catheter finally removed on 8/20 by IR. -Status post 3 days of IV cefepime and then changed to oral Vantin yesterday I called and discussed case with Dr. Megan Salon infectious disease given complexity of situation and SBP with MSSA, complex loculated fluid collections will treat for at least 2 weeks with IV ceftriaxone 2 g daily via PICC line until  9/6. -this will be managed via [facility] agency  2. Abd pain -Due to malignant ascites, complex fluid collection -She was noted to have some free air on her initial CT scan, General surgery consulted this was felt to be related to her peritoneal catheter and not from a bowel perforation per surgical evaluation -pain control was a significant issue this admission, seen by Palliative care, started on a Dilaudid PCA for pain control, then transitioned to increased dose of fentanyl patch and oral hydromorphone -Appreciate palliative care assistance, she is DNR but wants to have further discussions with Dr.Gudena prior to making any other changes to long term care plan, he will see her in the office on Monday  3. Metastatic breast cancer [facility]) - Had been on palliative chemotherapy. - Currently on hold due to infection - Overall prognosis appears to be quite poor, follow-up with Dr. Lindi Adie -see palliative discussions  4. Depression Continue Wellbutrin, and ativan PRN started  5. Anxiety -PRN ativan added now  6. Anemia/Pancytopenia ([place]) -Due to acute illness and metastatic cancer, chemotherapy Hemoglobin improved to 8.1 at discharge,  -remains neutropenic, d/w Dr.Gudena he will check labs on Monday at follow up   7. Edema/third spacing due to severe hypoalbuminemia -Last albumin was 1.2, due to metastatic cancer and malnutrition -Encouraged increased protein intake, diuresed with IV Lasix x2 days and transitioned to PO lasix at discharge, still has edema but improving -continue compression stockings.  8. Severe protein calorie malnutrition -supplements encouraged, RD consulted  Consultations:  Oncology [facility]  IR  Discharge Exam: Vitals:   09/15/16 2200 09/16/16 0600  BP: (!) 104/48 (!) 113/41  Pulse: Marland Kitchen)  112 (!) 110  Resp: 18 16  Temp: 98.2 F (36.8 C) 98.4 F (36.9 C)  SpO2: 100% 97%    General: AAOx3 Cardiovascular: S1S2/RRR Respiratory:  CTAB  Discharge Instructions   Discharge Instructions    Diet - low sodium heart healthy    Complete by:  As directed    Diet - low sodium heart healthy    Complete by:  As directed    Discharge instructions    Complete by:  As directed    It is important that you read following instructions as well as go over your medication list with RN to help you understand your care after this hospitalization.  Discharge Instructions: Please follow-up with PCP in one week  Please request your primary care physician to go over all Hospital Tests and Procedure/Radiological results at the follow up,  Please get all Hospital records sent to your PCP by signing hospital release before you go home.   Do not drive, operating heavy machinery, perform activities at heights, swimming or participation in water activities or provide baby sitting services while you are on Pain, Sleep and Anxiety Medications; until you have been seen by Primary Care Physician or a Neurologist and advised to do so again. Do not take more than prescribed Pain, Sleep and Anxiety Medications. You were cared for by a hospitalist during your hospital stay. If you have any questions about your discharge medications or the care you received while you were in the hospital after you are discharged, you can call the unit and ask to speak with the hospitalist on call if the hospitalist that took care of you is not available.  Once you are discharged, your primary care physician will handle any further medical issues. Please note that NO REFILLS for any discharge medications will be authorized once you are discharged, as it is imperative that you return to your primary care physician (or establish a relationship with a primary care physician if you do not have one) for your aftercare needs so that they can reassess your need for medications and monitor your lab values. You Must read complete instructions/literature along with all the possible  adverse reactions/side effects for all the Medicines you take and that have been prescribed to you. Take any new Medicines after you have completely understood and accept all the possible adverse reactions/side effects. Wear Seat belts while driving. If you have smoked or chewed Tobacco in the last 2 yrs please stop smoking and/or stop any Recreational drug use.   Increase activity slowly    Complete by:  As directed    Increase activity slowly    Complete by:  As directed      Current Discharge Medication List    START taking these medications   Details  cefTRIAXone 2 g in dextrose 5 % 50 mL Inject 2 g into the vein daily. Qty: 26 g, Refills: 0    furosemide (LASIX) 40 MG tablet Take 1 tablet (40 mg total) by mouth 2 (two) times daily. Qty: 60 tablet, Refills: 0    HYDROmorphone (DILAUDID) 4 MG tablet Take 1-2 tablets (4-8 mg total) by mouth every 3 (three) hours as needed for moderate pain or severe pain. Qty: 30 tablet, Refills: 0    LORazepam (ATIVAN) 0.5 MG tablet Take 1 tablet (0.5 mg total) by mouth 2 (two) times daily as needed (anxiety or sleep). Qty: 20 tablet, Refills: 0   Associated Diagnoses: Anxiety    Methylnaltrexone Bromide (RELISTOR) 150 MG TABS  Take 150 mg by mouth daily. Qty: 15 tablet, Refills: 0    pantoprazole (PROTONIX) 40 MG tablet Take 1 tablet (40 mg total) by mouth at bedtime. Qty: 30 tablet, Refills: 0    polyethylene glycol (MIRALAX / GLYCOLAX) packet Take 17 g by mouth daily. Qty: 14 each, Refills: 0    potassium chloride SA (K-DUR,KLOR-CON) 20 MEQ tablet Take 2 tablets (40 mEq total) by mouth daily. Qty: 30 tablet, Refills: 0    senna (SENOKOT) 8.6 MG TABS tablet Take 2 tablets (17.2 mg total) by mouth 2 (two) times daily. Qty: 120 each, Refills: 0      CONTINUE these medications which have CHANGED   Details  diphenhydrAMINE (BENADRYL) 25 mg capsule Take 1 capsule (25 mg total) by mouth at bedtime as needed for sleep.    fentaNYL  (DURAGESIC - DOSED MCG/HR) 75 MCG/HR Place 2 patches (150 mcg total) onto the skin every 3 (three) days. Qty: 5 patch, Refills: 0    ondansetron (ZOFRAN) 8 MG tablet Take 1 tablet (8 mg total) by mouth every 8 (eight) hours as needed for nausea or vomiting. Qty: 20 tablet, Refills: 0      CONTINUE these medications which have NOT CHANGED   Details  acetaminophen (TYLENOL) 500 MG tablet Take 1,000 mg by mouth every 6 (six) hours as needed.    buPROPion (WELLBUTRIN XL) 150 MG 24 hr tablet Take 150 mg by mouth daily.    Calcium Carbonate-Vit D-Min (CALCIUM 1200) 1200-1000 MG-UNIT CHEW Chew 1 tablet by mouth daily. Qty: 30 each      STOP taking these medications     fentaNYL (DURAGESIC - DOSED MCG/HR) 50 MCG/HR      ibuprofen (ADVIL,MOTRIN) 800 MG tablet      oxyCODONE (OXY IR/ROXICODONE) 5 MG immediate release tablet      pantoprazole sodium (PROTONIX) 40 mg/20 mL PACK      docusate sodium (COLACE) 100 MG capsule      palbociclib (IBRANCE) 125 MG capsule      sucralfate (CARAFATE) 1 GM/10ML suspension        No Known Allergies Follow-up Information    Zimmer, William, MD. Schedule an appointment as soon as possible for a visit in 1 week(s).   Specialty:  Internal Medicine Contact information: [address] [place] [address] [phone]        Nicholas Lose, MD. Schedule an appointment as soon as possible for a visit on 09/20/2016.   Specialty:  Hematology and Oncology Contact information: [place] [address] [phone]            The results of significant diagnostics from this hospitalization (including imaging, microbiology, ancillary and laboratory) are listed below for reference.    Significant Diagnostic Studies: Ct Abdomen Pelvis Wo Contrast  Result Date: 09/09/2016 CLINICAL DATA:  51 year old female with history of abdominal pain radiating to the back. History of breast cancer, currently undergoing  therapy. EXAM: CT ABDOMEN AND PELVIS WITHOUT CONTRAST TECHNIQUE: Multidetector CT imaging of the abdomen and pelvis was performed following the standard protocol without IV contrast. COMPARISON:  None. FINDINGS: Lower chest: Small right and trace pleural effusions lying dependently with some associated passive subsegmental atelectasis in the dependent portions of the lower lobes of the lungs bilaterally. Status post right mastectomy with right-sided breast implant. Hepatobiliary: No discrete cystic or solid hepatic lesions are confidently identified on today's noncontrast CT examination. Unenhanced appearance of the gallbladder is unremarkable. Pancreas: No definite pancreatic mass or peripancreatic  inflammatory changes are noted on today's noncontrast CT examination. Spleen: Unremarkable. Adrenals/Urinary Tract: 4 mm nonobstructive calculus in the upper pole collecting system of the right kidney. No additional calculi are identified within the left renal collecting system, along the course of either ureter or within the lumen of the urinary bladder. No hydroureteronephrosis to indicate urinary tract obstruction at this time. Urinary bladder is normal in appearance. Bilateral adrenal glands are enlarged and nodular in appearance, with the largest of these masses noted on the right side measuring approximately 2.9 x 4.8 cm (axial image 30 of series 2) with either significant mass effect upon or direct invasion into the adjacent inferior vena cava. Stomach/Bowel: Unenhanced appearance of the stomach is normal. No pathologic dilatation of small bowel or colon. The appendix is not confidently identified. In the right upper quadrant of the abdomen immediately anterior to the liver best appreciated on axial image 15 of series 2, coronal image 52 of series 4 and sagittal image 65 of series 5 there is a gas collection which appears to be extraluminal. Based on the appearance on coronal image 52 of series 4, this appears to  represent focal contained perforation of a loop of ileum (best appreciated when viewing images on lung window setting). Vascular/Lymphatic: Aortic atherosclerosis. No definite aneurysm identified in the abdominal or pelvic vasculature on today's noncontrast CT examination. Multiple prominent borderline enlarged retroperitoneal lymph nodes measuring up to 8 mm in short axis are nonspecific. Reproductive: Unenhanced appearance of the uterus and left ovary is unremarkable. In the right side of the pelvis closely associated with the right adnexal region there is a large soft tissue mass measuring 7.5 x 9.3 x 6.0 cm (axial image 76 of series 2 and coronal image 57 of series 4). Other: Large volume of ascites. Abdominal drain entering the right-sided abdomen with the tip extending into the mid anatomic pelvis anteriorly slightly to the left of midline. No pneumoperitoneum. Musculoskeletal: Several mixed lytic and sclerotic lesions are noted throughout the visualized axial and appendicular skeleton, compatible with widespread metastatic disease to the bones. The largest of these lesions occupies the T8 vertebral body where there is a pathologic compression fracture with approximately 15-20% loss of anterior vertebral body height. IMPRESSION: 1. Findings are highly concerning for contained perforated loop of distal small bowel (presumably ileum) in the right upper quadrant immediately anterior to the liver, as detailed above. Surgical consultation is recommended. 2. 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 3. Large volume of presumably  malignant ascites. 4. Small right and trace left pleural effusions with areas of passive atelectasis in the lung bases bilaterally. 5. Aortic atherosclerosis. 6. Additional incidental findings, as above. These results were called by telephone at the time of interpretation on 09/09/2016 at 6:51 pm to Dr. Canary Brim, who verbally acknowledged these results. Aortic Atherosclerosis (ICD10-I70.0). Electronically Signed   By: Vinnie Langton M.D.   On: 09/09/2016 18:59   Ct Angio Chest Pe W/cm &/or Wo Cm  Result Date: 08/30/2016 CLINICAL DATA:  Thoracic and lumbar pain with dyspnea. History of breast cancer with possible metastasis. EXAM: CT ANGIOGRAPHY CHEST WITH CONTRAST TECHNIQUE: Multidetector CT imaging of the chest was performed using  the standard protocol during bolus administration of intravenous contrast. Multiplanar CT image reconstructions and MIPs were obtained to evaluate the vascular anatomy. CONTRAST:  100 cc Isovue 370 IV COMPARISON:  None. FINDINGS: Cardiovascular: Normal heart size without pericardial effusion. No aortic aneurysm or dissection. No acute pulmonary embolus. Mediastinum/Nodes: 1 cm cystic nodule of the left thyroid gland with smaller 0.4 cm cystic nodule adjacent to it. No thyromegaly. Normal branch pattern of the great vessels. No mediastinal, axillary adenopathy. Right axillary lymph node dissection clips are noted. No hilar lymphadenopathy in is seen. Lungs/Pleura: Moderate right sided pleural effusion with adjacent compressive atelectasis. Ground-glass appearance of the remainder of the lungs likely reflect stigmata of hypoventilatory change or possibly pulmonary edema, alveolitis or pneumonitis among some possibilities though not exclusive. No dominant mass is seen. Upper Abdomen: Moderate volume of ascites noted in the upper included abdomen with omental thickening in the left upper quadrant and mesenteric edema. No space-occupying mass of the included liver. Status post right  mastectomy with intact appearing right saline breast implant. Musculoskeletal: Mixed osteolytic and sclerotic appearance of the T5 and T8 vertebral bodies with more subtle lytic appearance of the T4 and T9 vertebral bodies concerning for osseous metastatic disease. Review of the MIP images confirms the above findings. IMPRESSION: 1. No acute pulmonary embolus. 2. Moderate right effusion with compressive atelectasis. 3. Moderate amount of ascites seen of the included abdomen. Mesenteric edema and induration is noted with mild omental thickening. 4. 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   On: 08/30/2016 21:12   Ct Thoracic Spine Wo Contrast  Result Date: 08/30/2016 CLINICAL DATA:  Mid back pain, felt a pop midthoracic spine at site of prior fracture. History of metastatic breast cancer. Suspect pathologic fracture. EXAM: CT THORACIC AND LUMBAR SPINE WITHOUT CONTRAST TECHNIQUE: Multidetector CT imaging of the thoracic and lumbar spine was performed without contrast. Multiplanar CT image reconstructions were also generated. COMPARISON:  None. FINDINGS: CT THORACIC SPINE FINDINGS ALIGNMENT: Maintained thoracic lordosis. No malalignment. VERTEBRAE: Multiple lytic lesions within the vertebral bodies compatible with metastasis (T3 through T5, T8 and T9, T12). Expansile lytic lesion T4 spinous process with nondisplaced pathologic fracture. Moderate T8 pathologic fracture with approximately 50% height loss is likely acute. Mild T5 superior endplate pathologic fracture with central less than 25% superior endplate height loss, chronic appearing. No retropulsed bony fragments. Intervertebral disc heights generally preserved with multilevel mild ventral endplate spurring. PARASPINAL AND OTHER SOFT TISSUES: Paraspinal soft tissues are normal. Please see CT of chest from same day, reported separately for dedicated findings. 10 mm LEFT thyroid  nodule, below size followup recommendation. DISC LEVELS: No osseous canal stenosis or neural foraminal narrowing at any level. CT LUMBAR SPINE FINDINGS SEGMENTATION: For the purposes of this report the last well-formed intervertebral disc space is reported as L5-S1. ALIGNMENT: Maintained lumbar lordosis. No malalignment. VERTEBRAE: Lytic lesions in the sacrum. Subcentimeter lytic lesion L1, L2 L5. Vertebral bodies and posterior elements intact. Intervertebral disc heights preserved. Severe sacroiliac osteoarthrosis. PARASPINAL AND OTHER SOFT TISSUES: 3.6 RIGHT and 2.1 cm LEFT adrenal masses. 4 mm nonobstructing LEFT interpolar nephrolithiasis. Contrast in the colon. Ascites. DISC LEVELS: L1-2 through L4-5: No disc bulge, canal stenosis nor neural foraminal narrowing. L5-S1: Small broad-based disc bulge and endplate spurring. No canal stenosis. Moderate RIGHT neural foraminal narrowing. IMPRESSION: CT THORACIC SPINE IMPRESSION 1. Multiple lytic lesions consistent with osseous metastasis. Mild T5 and moderate T8 pathologic  fractures without retropulsion. 2. No canal stenosis or neural foraminal narrowing. CT LUMBAR SPINE IMPRESSION 1. Multiple lytic lesions consistent with osseous metastasis. No pathologic fracture. 2. Included abdomen demonstrates bilateral adrenal masses concerning for metastasis. Ascites. Recommend dedicated imaging. Electronically Signed   By: Elon Alas M.D.   On: 08/30/2016 21:15   Ct Lumbar Spine Wo Contrast  Result Date: 08/30/2016 CLINICAL DATA:  Mid back pain, felt a pop midthoracic spine at site of prior fracture. History of metastatic breast cancer. Suspect pathologic fracture. EXAM: CT THORACIC AND LUMBAR SPINE WITHOUT CONTRAST TECHNIQUE: Multidetector CT imaging of the thoracic and lumbar spine was performed without contrast. Multiplanar CT image reconstructions were also generated. COMPARISON:  None. FINDINGS: CT THORACIC SPINE FINDINGS ALIGNMENT: Maintained thoracic lordosis.  No malalignment. VERTEBRAE: Multiple lytic lesions within the vertebral bodies compatible with metastasis (T3 through T5, T8 and T9, T12). Expansile lytic lesion T4 spinous process with nondisplaced pathologic fracture. Moderate T8 pathologic fracture with approximately 50% height loss is likely acute. Mild T5 superior endplate pathologic fracture with central less than 25% superior endplate height loss, chronic appearing. No retropulsed bony fragments. Intervertebral disc heights generally preserved with multilevel mild ventral endplate spurring. PARASPINAL AND OTHER SOFT TISSUES: Paraspinal soft tissues are normal. Please see CT of chest from same day, reported separately for dedicated findings. 10 mm LEFT thyroid nodule, below size followup recommendation. DISC LEVELS: No osseous canal stenosis or neural foraminal narrowing at any level. CT LUMBAR SPINE FINDINGS SEGMENTATION: For the purposes of this report the last well-formed intervertebral disc space is reported as L5-S1. ALIGNMENT: Maintained lumbar lordosis. No malalignment. VERTEBRAE: Lytic lesions in the sacrum. Subcentimeter lytic lesion L1, L2 L5. Vertebral bodies and posterior elements intact. Intervertebral disc heights preserved. Severe sacroiliac osteoarthrosis. PARASPINAL AND OTHER SOFT TISSUES: 3.6 RIGHT and 2.1 cm LEFT adrenal masses. 4 mm nonobstructing LEFT interpolar nephrolithiasis. Contrast in the colon. Ascites. DISC LEVELS: L1-2 through L4-5: No disc bulge, canal stenosis nor neural foraminal narrowing. L5-S1: Small broad-based disc bulge and endplate spurring. No canal stenosis. Moderate RIGHT neural foraminal narrowing. IMPRESSION: CT THORACIC SPINE IMPRESSION 1. Multiple lytic lesions consistent with osseous metastasis. Mild T5 and moderate T8 pathologic fractures without retropulsion. 2. No canal stenosis or neural foraminal narrowing. CT LUMBAR SPINE IMPRESSION 1. Multiple lytic lesions consistent with osseous metastasis. No pathologic  fracture. 2. Included abdomen demonstrates bilateral adrenal masses concerning for metastasis. Ascites. Recommend dedicated imaging. Electronically Signed   By: Elon Alas M.D.   On: 08/30/2016 21:15   US Abdomen Limited  Result Date: 09/14/2016 CLINICAL DATA:  Evaluate for paracentesis EXAM: LIMITED ABDOMEN ULTRASOUND FOR ASCITES TECHNIQUE: Limited ultrasound survey for ascites was performed in all four abdominal quadrants. COMPARISON:  None. FINDINGS: Imaging in the abdomen demonstrates a highly loculated ascites. There is no large pocket available for decompression IMPRESSION: See above comments. Electronically Signed   By: Marybelle Killings M.D.   On: 09/14/2016 15:12   US Abdomen Limited  Result Date: 09/13/2016 CLINICAL DATA:  Evaluate ascites. EXAM: LIMITED ABDOMEN ULTRASOUND FOR ASCITES TECHNIQUE: Limited ultrasound survey for ascites was performed in all four abdominal quadrants. COMPARISON:  None. FINDINGS: Largely loculated ascites is seen in all 4 quadrants, particularly on the left. IMPRESSION: Largely loculated pockets of ascites are identified, most prominent on the left. Electronically Signed   By: Dorise Bullion III M.D   On: 09/13/2016 17:43   US Paracentesis  Result Date: 09/11/2016 INDICATION: History of metastatic breast cancer with metastatic tumor in the  pelvis. Recurrent abdominal ascites. Patient had a PleurX catheter placed [place] about 6 weeks ago. Unfortunately, this is no longer working. Request is made today for evaluation for a paracentesis since her catheter is not working. EXAM: ULTRASOUND GUIDED DIAGNOSTIC AND THERAPEUTIC PARACENTESIS MEDICATIONS: 2% xylocaine with epinephrine COMPLICATIONS: None immediate. PROCEDURE: Informed written consent was obtained from the patient after a discussion of the risks, benefits and alternatives to treatment. A timeout was performed prior to the initiation of the procedure. Initial ultrasound scanning demonstrates a small amount of  loculated ascites within the left upper abdominal quadrant. The left upper abdomen was prepped and draped in the usual sterile fashion. 2% xylocaine with epinephrine was used for local anesthesia. Following this, a 19 gauge, 7-cm, Yueh catheter was introduced. An ultrasound image was saved for documentation purposes. The paracentesis was performed. The catheter was removed and a dressing was applied. The patient tolerated the procedure well without immediate post procedural complication. FINDINGS: A total of approximately 1.6 L of serous fluid was removed. Samples were sent to the laboratory as requested by the clinical team. The patient was noted to have significant complex ascites noted throughout her abdomen. This was most densely loculated within the pelvis. IMPRESSION: Successful ultrasound-guided paracentesis yielding 1.6 liters of peritoneal fluid. Complex ascites with significant loculations, specifically within the pelvis. PleurX catheter is unlikely to ever work given complexity of fluid. Recommendation would be for removal. This was discussed with Dr. Earleen Newport as well as Dr. Posey Pronto. Read by: Saverio Danker, PA-C Electronically Signed   By: Corrie Mckusick D.O.   On: 09/11/2016 15:10   Ir Removal Perm Peritoneal Cath  Result Date: 09/13/2016 INDICATION: 51 year old with history of metastatic breast cancer. Patient has a tunneled peritoneal catheter which is no longer working and loculated ascites on recent imaging. Plan for removal of the peritoneal catheter. EXAM: REMOVAL OF TUNNELED PERITONEAL CATHETER MEDICATIONS: None ANESTHESIA/SEDATION: None COMPLICATIONS: None immediate. PROCEDURE: Informed written consent was obtained from the patient after a thorough discussion of the procedural risks, benefits and alternatives. All questions were addressed. Maximal Sterile Barrier Technique was utilized including caps, mask, sterile gowns, sterile gloves, sterile drape, hand hygiene and skin antiseptic. A timeout  was performed prior to the initiation of the procedure. The right abdominal catheter was prepped and draped in a sterile fashion. Skin around the catheter was anesthetized with 1% lidocaine with epinephrine. The cuff was dissected free and exposed. The catheter was removed without complication. Bandage placed over the catheter exit site. IMPRESSION: Successful removal of the tunneled peritoneal catheter. Electronically Signed   By: Markus Daft M.D.   On: 09/13/2016 15:26    Microbiology: Recent Results (from the past 240 hour(s))  Culture, body fluid-bottle     Status: None   Collection Time: 09/11/16  4:02 PM  Result Value Ref Range Status   Specimen Description PERITONEAL  Final   Special Requests NONE  Final   Gram Stain   Final    GRAM POSITIVE COCCI IN CLUSTERS IN BOTH AEROBIC AND ANAEROBIC BOTTLES CRITICAL RESULT CALLED TO, READ BACK BY AND VERIFIED WITH: P ARMSTRONG,RN AT 6599 09/12/16 BY L BENFIELD Performed at [facility], [place] [address], [place], [place] [address]    Culture ABUNDANT STAPHYLOCOCCUS AUREUS  Final   Report Status 09/14/2016 FINAL  Final   Organism ID, Bacteria STAPHYLOCOCCUS AUREUS  Final      Susceptibility   Staphylococcus aureus - MIC*    CIPROFLOXACIN <=0.5 SENSITIVE Sensitive  ERYTHROMYCIN <=0.25 SENSITIVE Sensitive     GENTAMICIN <=0.5 SENSITIVE Sensitive     OXACILLIN 0.5 SENSITIVE Sensitive     TETRACYCLINE <=1 SENSITIVE Sensitive     VANCOMYCIN <=0.5 SENSITIVE Sensitive     TRIMETH/SULFA <=10 SENSITIVE Sensitive     CLINDAMYCIN <=0.25 SENSITIVE Sensitive     RIFAMPIN <=0.5 SENSITIVE Sensitive     Inducible Clindamycin NEGATIVE Sensitive     * ABUNDANT STAPHYLOCOCCUS AUREUS  Gram stain     Status: None   Collection Time: 09/11/16  4:02 PM  Result Value Ref Range Status   Specimen Description PERITONEAL  Final   Special Requests NONE  Final   Gram Stain   Final    NO ORGANISMS SEEN CYTOSPIN SMEAR WBC PRESENT, PREDOMINANTLY PMN Performed  at [facility] [address] [place], [place] [address]    Report Status 09/12/2016 FINAL  Final     Labs: Basic Metabolic Panel:  Recent Labs Lab 09/12/16 0515 09/13/16 0532 09/14/16 0300 09/15/16 0448 09/16/16 0344  NA 139 137 139 141 140  K 3.8 3.6 3.3* 3.6 4.0  CL 107 105 106 107 104  CO2 24 24 25 26 27   GLUCOSE 99 109* 108* 81 78  BUN <5* <5* <5* <5* <5*  CREATININE 0.43* 0.44 0.45 0.35* 0.38*  CALCIUM 6.3* 6.4* 6.6* 6.6* 6.7*  MG 1.8  --   --   --   --    Liver Function Tests:  Recent Labs Lab 09/09/16 1413 09/11/16 1225 09/16/16 0344  AST 23 14* 25  ALT 19 13* 16  ALKPHOS 81 80 93  BILITOT 0.5 0.6 0.6  PROT 4.7* 4.5* 4.8*  ALBUMIN 1.3* 1.2* 1.3*    Recent Labs Lab 09/09/16 1413  LIPASE 16   No results for input(s): AMMONIA in the last 168 hours. CBC:  Recent Labs Lab 09/12/16 0515 09/13/16 0532 09/14/16 0300 09/15/16 0448 09/16/16 0344  WBC 1.8* 1.8* 1.2* 1.0* 1.1*  NEUTROABS 1.5* 1.4* 0.9* 0.6* 0.6*  HGB 7.9* 7.9* 7.7* 7.4* 7.4*  HCT 24.2* 24.3* 23.2* 23.0* 23.0*  MCV 92.0 91.7 93.5 92.4 92.4  PLT 49* 45* 40* 42* 42*   Cardiac Enzymes: No results for input(s): CKTOTAL, CKMB, CKMBINDEX, TROPONINI in the last 168 hours. BNP: BNP (last 3 results) No results for input(s): BNP in the last 8760 hours.  ProBNP (last 3 results) No results for input(s): PROBNP in the last 8760 hours.  CBG: No results for input(s): GLUCAP in the last 168 hours.     SignedDomenic Polite MD.  [facility] 09/16/2016, 11:39 AM

## 2016-09-16 NOTE — Progress Notes (Signed)
PHARMACY CONSULT NOTE FOR:  OUTPATIENT  PARENTERAL ANTIBIOTIC THERAPY (OPAT)  Indication: Peritoneal Culture Regimen: Rocephin 2gm q24 End date: 09/25/16 (total 14 doses from 8/19)  IV antibiotic discharge orders are pended. To discharging provider:  please sign these orders via discharge navigator,  Select New Orders & click on the button choice - Manage This Unsigned Work.     Thank you for allowing pharmacy to be a part of this patient's care.  Nyoka Cowden, Errica Dutil L 09/16/2016, 1:00 PM

## 2016-09-16 NOTE — Progress Notes (Signed)
Discharge planning, spoke with patient at beside. Chose [facility] for [facility] services, contacted [facility] for referral. Needs teaching for IV abx and PICC care. [facility] to f/u with patient. [phone]

## 2016-09-16 NOTE — Progress Notes (Signed)
Initial Nutrition Assessment  DOCUMENTATION CODES:   Severe malnutrition in context of chronic illness  INTERVENTION:   Provided list of protein foods to include in diet.  Reviewed low volume protein options. Encouraged PO intakes  RD will continue to monitor  NUTRITION DIAGNOSIS:   Malnutrition(severe) related to chronic illness, cancer and cancer related treatments as evidenced by percent weight loss, energy intake < or equal to 75% for > or equal to 1 month, moderate depletion of body fat, mild depletion of muscle mass, moderate to severe fluid accumulation.  GOAL:   Patient will meet greater than or equal to 90% of their needs  MONITOR:   PO intake, Supplement acceptance, Labs, Weight trends, I & O's  REASON FOR ASSESSMENT:   Consult Assessment of nutrition requirement/status  ASSESSMENT:   Pt. with PMH of Metastatic breast cancer with recurrence, malignant ascites and S/P peritoneal catheter placement, depression, anxiety; admitted on 09/09/2016, presented with complaint of severe abdominal pain, was found to have abdominal pain related to cancer uncontrolled. Also has SBP with staph aureus  Patient with many questions regarding appropriate protein foods to consume. RD provided list of protein foods and gram amounts. Reviewed estimated protein range with patient. Pt with questions regarding constipation. Pt states she has been drinking excessive amounts of water while admitted. Pt states she does want to eat but doesn't think to eat when she needs to. Encouraged consistent intakes, small frequent meals. Pt states her appetite changed around the time of diagnosis which was 6-7 weeks ago.  PO intake: 75% of breakfast this morning. Pt had omelet with cheese, peaches, ginger ale, and oatmeal.  Per patient, UBW is 155-160 lb. Pt has lost 7 lb since 8/6 (5% wt loss x 2.5 weeks, significant for time frame). Suspect weight loss related to fluid and recent paracentesis procedures.  Nutrition-Focused physical exam completed. Findings are moderate fat depletion, mild muscle depletion, and moderate edema.   Labs reviewed. Medications: IV Lasix BID, Protonix tablet daily, Miralax packet daily, Senokot tablet BID, IV Zofran PRN  Diet Order:  Diet - low sodium heart healthy DIET SOFT Room service appropriate? Yes; Fluid consistency: Thin Diet - low sodium heart healthy  Skin:  Reviewed, no issues  Last BM:  8/21  Height:   Ht Readings from Last 1 Encounters:  09/10/16 5\' 4"  (1.626 m)    Weight:   Wt Readings from Last 1 Encounters:  09/10/16 143 lb 4.8 oz (65 kg)    Ideal Body Weight:  54.5 kg  BMI:  Body mass index is 24.6 kg/m.  Estimated Nutritional Needs:   Kcal:  1950-2150  Protein:  90-100g  Fluid:  1.7-1.9L/day  EDUCATION NEEDS:   Education needs addressed  Clayton Bibles, MS, RD, LDN Pager: [phone] After Hours Pager: [phone]

## 2016-09-16 NOTE — Progress Notes (Signed)
Pt was discharged home today. Instructions were reviewed with patient,gave prescriptions to patient  and questions were answered. Pt was taken to main entrance via wheelchair by NT.

## 2016-09-20 ENCOUNTER — Ambulatory Visit: Payer: BLUE CROSS/BLUE SHIELD

## 2016-09-20 ENCOUNTER — Ambulatory Visit (HOSPITAL_BASED_OUTPATIENT_CLINIC_OR_DEPARTMENT_OTHER): Payer: BLUE CROSS/BLUE SHIELD | Admitting: Hematology and Oncology

## 2016-09-20 ENCOUNTER — Other Ambulatory Visit (HOSPITAL_BASED_OUTPATIENT_CLINIC_OR_DEPARTMENT_OTHER): Payer: BLUE CROSS/BLUE SHIELD

## 2016-09-20 ENCOUNTER — Telehealth: Payer: Self-pay | Admitting: Pharmacist

## 2016-09-20 ENCOUNTER — Encounter: Payer: Self-pay | Admitting: Hematology and Oncology

## 2016-09-20 ENCOUNTER — Ambulatory Visit (HOSPITAL_BASED_OUTPATIENT_CLINIC_OR_DEPARTMENT_OTHER): Payer: BLUE CROSS/BLUE SHIELD

## 2016-09-20 ENCOUNTER — Telehealth: Payer: Self-pay

## 2016-09-20 ENCOUNTER — Other Ambulatory Visit: Payer: Self-pay

## 2016-09-20 VITALS — BP 116/82 | HR 100 | Temp 98.1°F | Resp 18 | Ht 64.0 in | Wt 147.1 lb

## 2016-09-20 DIAGNOSIS — B9689 Other specified bacterial agents as the cause of diseases classified elsewhere: Secondary | ICD-10-CM | POA: Diagnosis not present

## 2016-09-20 DIAGNOSIS — J90 Pleural effusion, not elsewhere classified: Secondary | ICD-10-CM

## 2016-09-20 DIAGNOSIS — E279 Disorder of adrenal gland, unspecified: Secondary | ICD-10-CM

## 2016-09-20 DIAGNOSIS — D61818 Other pancytopenia: Secondary | ICD-10-CM

## 2016-09-20 DIAGNOSIS — G893 Neoplasm related pain (acute) (chronic): Secondary | ICD-10-CM

## 2016-09-20 DIAGNOSIS — C50919 Malignant neoplasm of unspecified site of unspecified female breast: Secondary | ICD-10-CM

## 2016-09-20 DIAGNOSIS — K769 Liver disease, unspecified: Secondary | ICD-10-CM

## 2016-09-20 DIAGNOSIS — K5903 Drug induced constipation: Secondary | ICD-10-CM

## 2016-09-20 DIAGNOSIS — C7951 Secondary malignant neoplasm of bone: Secondary | ICD-10-CM

## 2016-09-20 DIAGNOSIS — C7900 Secondary malignant neoplasm of unspecified kidney and renal pelvis: Secondary | ICD-10-CM

## 2016-09-20 DIAGNOSIS — K652 Spontaneous bacterial peritonitis: Secondary | ICD-10-CM

## 2016-09-20 DIAGNOSIS — Z170 Estrogen receptor positive status [ER+]: Principal | ICD-10-CM

## 2016-09-20 DIAGNOSIS — R180 Malignant ascites: Secondary | ICD-10-CM

## 2016-09-20 DIAGNOSIS — R600 Localized edema: Secondary | ICD-10-CM

## 2016-09-20 DIAGNOSIS — Z5111 Encounter for antineoplastic chemotherapy: Secondary | ICD-10-CM | POA: Diagnosis not present

## 2016-09-20 DIAGNOSIS — C50512 Malignant neoplasm of lower-outer quadrant of left female breast: Secondary | ICD-10-CM

## 2016-09-20 LAB — CBC WITH DIFFERENTIAL/PLATELET
BASO%: 0.7 % (ref 0.0–2.0)
Basophils Absolute: 0 10*3/uL (ref 0.0–0.1)
EOS ABS: 0 10*3/uL (ref 0.0–0.5)
EOS%: 0 % (ref 0.0–7.0)
HCT: 25.9 % — ABNORMAL LOW (ref 34.8–46.6)
HGB: 8.1 g/dL — ABNORMAL LOW (ref 11.6–15.9)
LYMPH%: 18.1 % (ref 14.0–49.7)
MCH: 29.8 pg (ref 25.1–34.0)
MCHC: 31.3 g/dL — ABNORMAL LOW (ref 31.5–36.0)
MCV: 95.2 fL (ref 79.5–101.0)
MONO#: 0.4 10*3/uL (ref 0.1–0.9)
MONO%: 12.4 % (ref 0.0–14.0)
NEUT%: 68.8 % (ref 38.4–76.8)
NEUTROS ABS: 1.9 10*3/uL (ref 1.5–6.5)
NRBC: 0 % (ref 0–0)
Platelets: 85 10*3/uL — ABNORMAL LOW (ref 145–400)
RBC: 2.72 10*6/uL — AB (ref 3.70–5.45)
RDW: 13.7 % (ref 11.2–14.5)
WBC: 2.8 10*3/uL — AB (ref 3.9–10.3)
lymph#: 0.5 10*3/uL — ABNORMAL LOW (ref 0.9–3.3)

## 2016-09-20 LAB — TYPE AND SCREEN
ABO/RH(D): A POS
Antibody Screen: NEGATIVE
UNIT DIVISION: 0

## 2016-09-20 LAB — COMPREHENSIVE METABOLIC PANEL
ALT: 16 U/L (ref 0–55)
AST: 25 U/L (ref 5–34)
Albumin: 1.5 g/dL — ABNORMAL LOW (ref 3.5–5.0)
Alkaline Phosphatase: 110 U/L (ref 40–150)
Anion Gap: 12 mEq/L — ABNORMAL HIGH (ref 3–11)
BILIRUBIN TOTAL: 0.33 mg/dL (ref 0.20–1.20)
BUN: 4.6 mg/dL — AB (ref 7.0–26.0)
CHLORIDE: 95 meq/L — AB (ref 98–109)
CO2: 31 meq/L — AB (ref 22–29)
CREATININE: 0.7 mg/dL (ref 0.6–1.1)
Calcium: 8.8 mg/dL (ref 8.4–10.4)
EGFR: >90 mL/min/{1.73_m2} (ref >90)
GLUCOSE: 95 mg/dL (ref 70–140)
Potassium: 3.8 mEq/L (ref 3.5–5.1)
Sodium: 138 mEq/L (ref 136–145)
TOTAL PROTEIN: 5.9 g/dL — AB (ref 6.4–8.3)

## 2016-09-20 LAB — BPAM RBC
Blood Product Expiration Date: 201809062359
Unit Type and Rh: 6200

## 2016-09-20 MED ORDER — METHYLPREDNISOLONE SODIUM SUCC 125 MG IJ SOLR: 125.0000 mg | Freq: Once | INTRAMUSCULAR | Status: DC | PRN

## 2016-09-20 MED ORDER — PALBOCICLIB 100 MG PO CAPS: 100.0000 mg | ORAL_CAPSULE | Freq: Every day | ORAL | 0 refills | Status: DC

## 2016-09-20 MED ORDER — SODIUM CHLORIDE 0.9 % IV SOLN: Freq: Once | INTRAVENOUS | Status: DC | PRN

## 2016-09-20 MED ORDER — ALBUTEROL SULFATE (2.5 MG/3ML) 0.083% IN NEBU
2.5000 mg | INHALATION_SOLUTION | Freq: Once | RESPIRATORY_TRACT | Status: DC | PRN
  Filled 2016-09-20: qty 3

## 2016-09-20 MED ORDER — DENOSUMAB 120 MG/1.7ML ~~LOC~~ SOLN
120.0000 mg | Freq: Once | SUBCUTANEOUS | Status: AC
  Administered 2016-09-20: 120 mg via SUBCUTANEOUS
  Filled 2016-09-20: qty 1.7

## 2016-09-20 MED ORDER — SENNA 8.6 MG PO TABS
2.0000 | ORAL_TABLET | Freq: Two times a day (BID) | ORAL | 6 refills | Status: DC
Start: 2016-09-20 — End: 2016-10-02

## 2016-09-20 MED ORDER — PALBOCICLIB 100 MG PO CAPS: 100.0000 mg | ORAL_CAPSULE | Freq: Every day | ORAL | 3 refills | Status: DC

## 2016-09-20 MED ORDER — DIPHENHYDRAMINE HCL 50 MG/ML IJ SOLN: 50.0000 mg | Freq: Once | INTRAMUSCULAR | Status: DC | PRN

## 2016-09-20 MED ORDER — FULVESTRANT 250 MG/5ML IM SOLN
500.0000 mg | Freq: Once | INTRAMUSCULAR | Status: AC
  Administered 2016-09-20: 500 mg via INTRAMUSCULAR
  Filled 2016-09-20: qty 10

## 2016-09-20 MED ORDER — DIPHENHYDRAMINE HCL 50 MG/ML IJ SOLN: 25.0000 mg | Freq: Once | INTRAMUSCULAR | Status: DC | PRN

## 2016-09-20 NOTE — Patient Instructions (Signed)
Fulvestrant injection What is this medicine? FULVESTRANT (ful VES trant) blocks the effects of estrogen. It is used to treat breast cancer. This medicine may be used for other purposes; ask your health care provider or pharmacist if you have questions. COMMON BRAND NAME(S): FASLODEX What should I tell my health care provider before I take this medicine? They need to know if you have any of these conditions: -bleeding problems -liver disease -low levels of platelets in the blood -an unusual or allergic reaction to fulvestrant, other medicines, foods, dyes, or preservatives -pregnant or trying to get pregnant -breast-feeding How should I use this medicine? This medicine is for injection into a muscle. It is usually given by a health care professional in a hospital or clinic setting. Talk to your pediatrician regarding the use of this medicine in children. Special care may be needed. Overdosage: If you think you have taken too much of this medicine contact a poison control center or emergency room at once. NOTE: This medicine is only for you. Do not share this medicine with others. What if I miss a dose? It is important not to miss your dose. Call your doctor or health care professional if you are unable to keep an appointment. What may interact with this medicine? -medicines that treat or prevent blood clots like warfarin, enoxaparin, and dalteparin This list may not describe all possible interactions. Give your health care provider a list of all the medicines, herbs, non-prescription drugs, or dietary supplements you use. Also tell them if you smoke, drink alcohol, or use illegal drugs. Some items may interact with your medicine. What should I watch for while using this medicine? Your condition will be monitored carefully while you are receiving this medicine. You will need important blood work done while you are taking this medicine. Do not become pregnant while taking this medicine or for  at least 1 year after stopping it. Women of child-bearing potential will need to have a negative pregnancy test before starting this medicine. Women should inform their doctor if they wish to become pregnant or think they might be pregnant. There is a potential for serious side effects to an unborn child. Men should inform their doctors if they wish to father a child. This medicine may lower sperm counts. Talk to your health care professional or pharmacist for more information. Do not breast-feed an infant while taking this medicine or for 1 year after the last dose. What side effects may I notice from receiving this medicine? Side effects that you should report to your doctor or health care professional as soon as possible: -allergic reactions like skin rash, itching or hives, swelling of the face, lips, or tongue -feeling faint or lightheaded, falls -pain, tingling, numbness, or weakness in the legs -signs and symptoms of infection like fever or chills; cough; flu-like symptoms; sore throat -vaginal bleeding Side effects that usually do not require medical attention (report to your doctor or health care professional if they continue or are bothersome): -aches, pains -constipation -diarrhea -headache -hot flashes -nausea, vomiting -pain at site where injected -stomach pain This list may not describe all possible side effects. Call your doctor for medical advice about side effects. You may report side effects to FDA at 1-800-FDA-1088. Where should I keep my medicine? This drug is given in a hospital or clinic and will not be stored at home. NOTE: This sheet is a summary. It may not cover all possible information. If you have questions about this medicine, talk to your   doctor, pharmacist, or health care provider.  2018 Elsevier/Gold Standard (2014-08-09 11:03:55) Denosumab injection What is this medicine? DENOSUMAB (den oh sue mab) slows bone breakdown. Prolia is used to treat osteoporosis in  women after menopause and in men. Delton See is used to treat a high calcium level due to cancer and to prevent bone fractures and other bone problems caused by multiple myeloma or cancer bone metastases. Delton See is also used to treat giant cell tumor of the bone. This medicine may be used for other purposes; ask your health care provider or pharmacist if you have questions. COMMON BRAND NAME(S): Prolia, XGEVA What should I tell my health care provider before I take this medicine? They need to know if you have any of these conditions: -dental disease -having surgery or tooth extraction -infection -kidney disease -low levels of calcium or Vitamin D in the blood -malnutrition -on hemodialysis -skin conditions or sensitivity -thyroid or parathyroid disease -an unusual reaction to denosumab, other medicines, foods, dyes, or preservatives -pregnant or trying to get pregnant -breast-feeding How should I use this medicine? This medicine is for injection under the skin. It is given by a health care professional in a hospital or clinic setting. If you are getting Prolia, a special MedGuide will be given to you by the pharmacist with each prescription and refill. Be sure to read this information carefully each time. For Prolia, talk to your pediatrician regarding the use of this medicine in children. Special care may be needed. For Delton See, talk to your pediatrician regarding the use of this medicine in children. While this drug may be prescribed for children as young as 13 years for selected conditions, precautions do apply. Overdosage: If you think you have taken too much of this medicine contact a poison control center or emergency room at once. NOTE: This medicine is only for you. Do not share this medicine with others. What if I miss a dose? It is important not to miss your dose. Call your doctor or health care professional if you are unable to keep an appointment. What may interact with this  medicine? Do not take this medicine with any of the following medications: -other medicines containing denosumab This medicine may also interact with the following medications: -medicines that lower your chance of fighting infection -steroid medicines like prednisone or cortisone This list may not describe all possible interactions. Give your health care provider a list of all the medicines, herbs, non-prescription drugs, or dietary supplements you use. Also tell them if you smoke, drink alcohol, or use illegal drugs. Some items may interact with your medicine. What should I watch for while using this medicine? Visit your doctor or health care professional for regular checks on your progress. Your doctor or health care professional may order blood tests and other tests to see how you are doing. Call your doctor or health care professional for advice if you get a fever, chills or sore throat, or other symptoms of a cold or flu. Do not treat yourself. This drug may decrease your body's ability to fight infection. Try to avoid being around people who are sick. You should make sure you get enough calcium and vitamin D while you are taking this medicine, unless your doctor tells you not to. Discuss the foods you eat and the vitamins you take with your health care professional. See your dentist regularly. Brush and floss your teeth as directed. Before you have any dental work done, tell your dentist you are receiving this medicine. Do  not become pregnant while taking this medicine or for 5 months after stopping it. Talk with your doctor or health care professional about your birth control options while taking this medicine. Women should inform their doctor if they wish to become pregnant or think they might be pregnant. There is a potential for serious side effects to an unborn child. Talk to your health care professional or pharmacist for more information. What side effects may I notice from receiving this  medicine? Side effects that you should report to your doctor or health care professional as soon as possible: -allergic reactions like skin rash, itching or hives, swelling of the face, lips, or tongue -bone pain -breathing problems -dizziness -jaw pain, especially after dental work -redness, blistering, peeling of the skin -signs and symptoms of infection like fever or chills; cough; sore throat; pain or trouble passing urine -signs of low calcium like fast heartbeat, muscle cramps or muscle pain; pain, tingling, numbness in the hands or feet; seizures -unusual bleeding or bruising -unusually weak or tired Side effects that usually do not require medical attention (report to your doctor or health care professional if they continue or are bothersome): -constipation -diarrhea -headache -joint pain -loss of appetite -muscle pain -runny nose -tiredness -upset stomach This list may not describe all possible side effects. Call your doctor for medical advice about side effects. You may report side effects to FDA at 1-800-FDA-1088. Where should I keep my medicine? This medicine is only given in a clinic, doctor's office, or other health care setting and will not be stored at home. NOTE: This sheet is a summary. It may not cover all possible information. If you have questions about this medicine, talk to your doctor, pharmacist, or health care provider.  2018 Elsevier/Gold Standard (2016-02-03 19:17:21)  

## 2016-09-20 NOTE — Telephone Encounter (Signed)
appts made and avs printed per 8/27 los

## 2016-09-20 NOTE — Progress Notes (Signed)
Per Dr.Gudena, pt to have CBC drawn next week with home care. Faxed orders to [facility] today.

## 2016-09-20 NOTE — Telephone Encounter (Signed)
Oral Chemotherapy Pharmacist Encounter  Received notification from [facility] that they have received new prescription for [facility] for patient. They are outside of patient's insurance payer network so prescription will have to be sent to [facility] per insurance requirement.  I will send one-time prescription to [facility] to fill on manufacturer free voucher.  Noted patient is to have CBC check by home health nurse next week to ensure adequate platelet count prior to restart of Ibrance at reduced dosage.  We will follow-up with patient about when this current fill of [facility] will be ready at the pharmacy.  I will wait for this CBC check prior to sending Ibrance Rx to outside pharmacy for cycle 3 fill.  Oral Oncology Clinic will continue to follow.  Thank you,  Johny Drilling, PharmD, BCPS, BCOP 09/20/2016  11:15 AM Oral Oncology Clinic [phone]

## 2016-09-20 NOTE — Assessment & Plan Note (Signed)
Metastatic breast cancer with bone, lymph nodes, liver, pleural effusion, ascites, pelvic masses and intrarenal metastases diagnosed 07/15/2016 (Prior history of stage IIIc breast cancer December 2015 treated with mastectomy, adjuvant chemotherapy, radiation and tamoxifen)  Current treatment: Palbociclib with Faslodex and Xgeva Hospitalization for MSSA spontaneous bacterial peritonitis 09/09/2016- 09/16/2016  Plan: 1. Palbociclib being held for pancytopenia. Will administer Faslodex and Xgeva today. 2. Bone metastases: Xgeva once a month along with calcium and vitamin D.  Pain from bone metastases: Currently on fentanyl patch 75 g every 72 hours along with Dilaudid. Relistor has been added for opioid-induced constipation  Return to clinic in 1 weeks for recheck of labs

## 2016-09-20 NOTE — Progress Notes (Signed)
Patient Care Team: Olena Mater, MD as PCP - General (Internal Medicine) Elveria Rising, MD (Obstetrics and Gynecology)  DIAGNOSIS:  Encounter Diagnoses  Name Primary?  . Bone metastases ([place]) Yes  . Metastatic breast cancer ([place])     SUMMARY OF ONCOLOGIC HISTORY:   Metastatic breast cancer ([place])   01/21/2014 Initial Diagnosis    Right breast cancer status post mastectomy and axillary lymph node dissection multifocal disease 4.4 cm, 1.1 cm, 1 mm, ER 93%, PR 40-50%, HER-2 negative ratio 1.3, T2 N3 a stage IIIc diagnosed at [facility] by Dr.Katragadda; treated with adjuvant chemotherapy with before meals 4 followed by Taxotere 12 followed by radiation and tamoxifen      07/15/2016 Relapse/Recurrence    Patient presented with abdominal distention and ascites; CT 07/15/2016 revealed ascites, pleural effusion, pericardial masses, 8 mm right liver nodule, adrenal nodule; biopsy metastatic breast cancer ER/PR positive HER-2 negative; MRI pelvis 07/15/2016 pelvic mass is 5.27 minutes, 5.6 cm, right adrenal nodule 4.2 cm      07/23/2016 Miscellaneous    Paracentesis 1.7 L cytology negative, peritoneal catheter, patient claims that once or twice a week      07/26/2016 PET scan    Pelvic masses, intrarenal mass, portal caval lymph node, anterior mediastinal lymph node, several bone lesions T8 compression fracture and right femur concern for impending fracture, right pleural effusion, ascites      08/08/2016 -  Anti-estrogen oral therapy    Faslodex (loading doses given on 08/08/2016 and 08/23/2016) Palbociclib (started 08/23/2016) and Xgeva (last injection 08/12/2016)      08/10/2016 - 08/24/2016 Radiation Therapy    Radiation therapy to T8 and bilateral femurs (complication: Esophagitis): Radiation done in [place] ([facility])      09/09/2016 - 09/16/2016 Hospital Admission    Hospitalization for infected loculated ascites (MSSA SBP), pancytopenia        CHIEF COMPLIANT: Follow-up after recent hospitalization for spontaneous bacterial peritonitis  INTERVAL HISTORY: Tamara Byrd is a 51 year old with above-mentioned symptoms metastatic breast cancer who was recently in the hospital with spontaneous bacterial peritonitis as a result of an indwelling abdominal catheter for recurrent malignant ascites. The catheter was removed and the fluid was drained. She is currently on IV antibiotics using the PICC line. Denies any fevers or chills. Her biggest complaint is profound leg swelling. She is currently on 40 mg twice a day of Lasix. Her albumin the hospital was 1.5. She is starting to eat more solid food. She requests compression stockings. She was also severely pancytopenic in the hospital. Palbociclib has been on hold.  REVIEW OF SYSTEMS:   Constitutional: Denies fevers, chills or abnormal weight loss Eyes: Denies blurriness of vision Ears, nose, mouth, throat, and face: Denies mucositis or sore throat Respiratory: Denies cough, dyspnea or wheezes Cardiovascular: Denies palpitation, chest discomfort Gastrointestinal:  Intermittent abdominal pain and cramps Skin: Denies abnormal skin rashes Lymphatics: Denies new lymphadenopathy or easy bruising Neurological:Denies numbness, tingling or new weaknesses Behavioral/Psych: Mood is stable, no new changes  Extremities: Severe bilateral lower extremity edema  All other systems were reviewed with the patient and are negative.  I have reviewed the past medical history, past surgical history, social history and family history with the patient and they are unchanged from previous note.  ALLERGIES:  has No Known Allergies.  MEDICATIONS:  Current Outpatient Prescriptions  Medication Sig Dispense Refill  . acetaminophen (TYLENOL) 500 MG tablet Take 1,000 mg by mouth every 6 (six) hours as needed.    Marland Kitchen  buPROPion (WELLBUTRIN XL) 150 MG 24 hr tablet Take 150 mg by mouth daily.    . Calcium  Carbonate-Vit D-Min (CALCIUM 1200) 1200-1000 MG-UNIT CHEW Chew 1 tablet by mouth daily. 30 each   . cefTRIAXone 2 g in dextrose 5 % 50 mL Inject 2 g into the vein daily. 26 g 0  . diphenhydrAMINE (BENADRYL) 25 mg capsule Take 1 capsule (25 mg total) by mouth at bedtime as needed for sleep.    . fentaNYL (DURAGESIC - DOSED MCG/HR) 75 MCG/HR Place 2 patches (150 mcg total) onto the skin every 3 (three) days. 5 patch 0  . furosemide (LASIX) 40 MG tablet Take 1 tablet (40 mg total) by mouth 2 (two) times daily. 60 tablet 0  . HYDROmorphone (DILAUDID) 4 MG tablet Take 1-2 tablets (4-8 mg total) by mouth every 3 (three) hours as needed for moderate pain or severe pain. 30 tablet 0  . LORazepam (ATIVAN) 0.5 MG tablet Take 1 tablet (0.5 mg total) by mouth 2 (two) times daily as needed (anxiety or sleep). 20 tablet 0  . Methylnaltrexone Bromide (RELISTOR) 150 MG TABS Take 150 mg by mouth daily. 15 tablet 0  . ondansetron (ZOFRAN) 8 MG tablet Take 1 tablet (8 mg total) by mouth every 8 (eight) hours as needed for nausea or vomiting. 20 tablet 0  . palbociclib (IBRANCE) 100 MG capsule Take 1 capsule (100 mg total) by mouth daily with breakfast. Take whole with food. 21 capsule 3  . pantoprazole (PROTONIX) 40 MG tablet Take 1 tablet (40 mg total) by mouth at bedtime. 30 tablet 0  . polyethylene glycol (MIRALAX / GLYCOLAX) packet Take 17 g by mouth daily. 14 each 0  . potassium chloride SA (K-DUR,KLOR-CON) 20 MEQ tablet Take 2 tablets (40 mEq total) by mouth daily. 30 tablet 0  . senna (SENOKOT) 8.6 MG TABS tablet Take 2 tablets (17.2 mg total) by mouth 2 (two) times daily. 120 each 6   No current facility-administered medications for this visit.    Facility-Administered Medications Ordered in Other Visits  Medication Dose Route Frequency Provider Last Rate Last Dose  . albuterol (PROVENTIL) (2.5 MG/3ML) 0.083% nebulizer solution 2.5 mg  2.5 mg Nebulization Once PRN Nicholas Lose, MD        PHYSICAL  EXAMINATION: ECOG PERFORMANCE STATUS: 2 - Symptomatic, <50% confined to bed  Vitals:   09/20/16 0934  BP: 116/82  Pulse: 100  Resp: 18  Temp: 98.1 F (36.7 C)  SpO2: 100%   Filed Weights   09/20/16 0934  Weight: 147 lb 1.6 oz (66.7 kg)    GENERAL:alert, no distress and comfortable SKIN: skin color, texture, turgor are normal, no rashes or significant lesions EYES: normal, Conjunctiva are pink and non-injected, sclera clear OROPHARYNX:no exudate, no erythema and lips, buccal mucosa, and tongue normal  NECK: supple, thyroid normal size, non-tender, without nodularity LYMPH:  no palpable lymphadenopathy in the cervical, axillary or inguinal LUNGS: clear to auscultation and percussion with normal breathing effort HEART: regular rate & rhythm and no murmurs and no lower extremity edema ABDOMEN:abdomen soft, non-tender and normal bowel sounds MUSCULOSKELETAL:no cyanosis of digits and no clubbing  NEURO: alert & oriented x 3 with fluent speech, no focal motor/sensory deficits EXTREMITIES: 3+ bilateral lower extremity edema  LABORATORY DATA:  I have reviewed the data as listed   Chemistry      Component Value Date/Time   NA 138 09/20/2016 0900   K 3.8 09/20/2016 0900   CL 104 09/16/2016 0344  CO2 31 (H) 09/20/2016 0900   BUN 4.6 (L) 09/20/2016 0900   CREATININE 0.7 09/20/2016 0900      Component Value Date/Time   CALCIUM 8.8 09/20/2016 0900   ALKPHOS 110 09/20/2016 0900   AST 25 09/20/2016 0900   ALT 16 09/20/2016 0900   BILITOT 0.33 09/20/2016 0900       Lab Results  Component Value Date   WBC 2.8 (L) 09/20/2016   HGB 8.1 (L) 09/20/2016   HCT 25.9 (L) 09/20/2016   MCV 95.2 09/20/2016   PLT 85 (L) 09/20/2016   NEUTROABS 1.9 09/20/2016    ASSESSMENT & PLAN:  Metastatic breast cancer ([place]) Metastatic breast cancer with bone, lymph nodes, liver, pleural effusion, ascites, pelvic masses and intrarenal metastases diagnosed 07/15/2016 (Prior history of stage IIIc  breast cancer December 2015 treated with mastectomy, adjuvant chemotherapy, radiation and tamoxifen)  Current treatment: Palbociclib with Faslodex and Xgeva Hospitalization for MSSA spontaneous bacterial peritonitis 09/09/2016- 09/16/2016  Plan: 1. Palbociclib being held for pancytopenia. Will administer Faslodex and Xgeva today. 2. Bone metastases: Xgeva once a month along with calcium and vitamin D.  Pain from bone metastases: Currently on fentanyl patch 75 g every 72 hours along with Dilaudid. Relistor has been added for opioid-induced constipation Severe lower extremity edema: Lasix 40 mg twice a day with oral potassium  Severe pancytopenia: Patient's blood counts are improving. Her ANC today is 1.9. Hemoglobin is still at 8.1. Platelet counts are up to 84. I discussed with her that if her platelet counts over 100 then we can resume Palbociclib. I reduced the dosage of Palbociclib 200 mg daily and sent a prescription to [facility]. Next week she will have home health draw CBC. If her blood counts improve then we can resume Palbociclib 100 mg dosage.  Spontaneous bacterial peritonitis: Currently on antibiotics Return to clinic in 5 weeks for recheck of labs and for Faslodex injections.   I spent 25 minutes talking to the patient of which more than half was spent in counseling and coordination of care.  No orders of the defined types were placed in this encounter.  The patient has a good understanding of the overall plan. she agrees with it. she will call with any problems that may develop before the next visit here.   Rulon Eisenmenger, MD 09/20/16

## 2016-09-24 ENCOUNTER — Telehealth: Payer: Self-pay

## 2016-09-24 ENCOUNTER — Other Ambulatory Visit: Payer: Self-pay

## 2016-09-24 MED ORDER — FENTANYL 75 MCG/HR TD PT72: 150.0000 ug | MEDICATED_PATCH | TRANSDERMAL | 0 refills | Status: DC

## 2016-09-24 NOTE — Telephone Encounter (Signed)
1242 Pt called for fentanyl refill. Called her back it was ready. Told her to come by 4 pm.

## 2016-09-24 NOTE — Telephone Encounter (Signed)
Called and lvm for pt to let her know that she may come in today and pick up a refill prescription for fentanyl patch. Note that we are closed on Monday for Labor day. Instructed pt to call back and confirm she will be picking script today before 5pm.

## 2016-09-28 ENCOUNTER — Other Ambulatory Visit: Payer: Self-pay

## 2016-09-28 ENCOUNTER — Ambulatory Visit: Payer: BLUE CROSS/BLUE SHIELD

## 2016-09-28 ENCOUNTER — Other Ambulatory Visit: Payer: Self-pay | Admitting: Hematology and Oncology

## 2016-09-28 DIAGNOSIS — C50919 Malignant neoplasm of unspecified site of unspecified female breast: Secondary | ICD-10-CM

## 2016-09-28 DIAGNOSIS — D649 Anemia, unspecified: Secondary | ICD-10-CM

## 2016-09-28 NOTE — Progress Notes (Signed)
Received labs from [facility]. Hg 6.8/ hct 20.9 Dr.Gudena notified. Requested for scheduling to make lab appt and blood transfusion time for pt. LVM on pt phone and call back to confirm appts for this week.

## 2016-09-29 ENCOUNTER — Other Ambulatory Visit: Payer: Self-pay

## 2016-09-29 ENCOUNTER — Telehealth: Payer: Self-pay | Admitting: Hematology and Oncology

## 2016-09-29 ENCOUNTER — Other Ambulatory Visit (HOSPITAL_BASED_OUTPATIENT_CLINIC_OR_DEPARTMENT_OTHER): Payer: BLUE CROSS/BLUE SHIELD

## 2016-09-29 ENCOUNTER — Ambulatory Visit (HOSPITAL_BASED_OUTPATIENT_CLINIC_OR_DEPARTMENT_OTHER): Payer: BLUE CROSS/BLUE SHIELD

## 2016-09-29 ENCOUNTER — Ambulatory Visit (HOSPITAL_COMMUNITY)
Admission: RE | Admit: 2016-09-29 | Discharge: 2016-09-29 | Disposition: A | Discharge status: Home / Self Care | Payer: BLUE CROSS/BLUE SHIELD | Source: Ambulatory Visit | Attending: Hematology and Oncology | Admitting: Hematology and Oncology

## 2016-09-29 DIAGNOSIS — C7951 Secondary malignant neoplasm of bone: Secondary | ICD-10-CM

## 2016-09-29 DIAGNOSIS — C50919 Malignant neoplasm of unspecified site of unspecified female breast: Secondary | ICD-10-CM

## 2016-09-29 DIAGNOSIS — Z170 Estrogen receptor positive status [ER+]: Principal | ICD-10-CM

## 2016-09-29 DIAGNOSIS — F419 Anxiety disorder, unspecified: Secondary | ICD-10-CM

## 2016-09-29 DIAGNOSIS — Z95828 Presence of other vascular implants and grafts: Secondary | ICD-10-CM

## 2016-09-29 DIAGNOSIS — C7900 Secondary malignant neoplasm of unspecified kidney and renal pelvis: Secondary | ICD-10-CM

## 2016-09-29 DIAGNOSIS — D649 Anemia, unspecified: Secondary | ICD-10-CM | POA: Insufficient documentation

## 2016-09-29 DIAGNOSIS — C50512 Malignant neoplasm of lower-outer quadrant of left female breast: Secondary | ICD-10-CM

## 2016-09-29 LAB — COMPREHENSIVE METABOLIC PANEL
ALT: 19 U/L (ref 0–55)
AST: 38 U/L — AB (ref 5–34)
Albumin: 1.8 g/dL — ABNORMAL LOW (ref 3.5–5.0)
Alkaline Phosphatase: 107 U/L (ref 40–150)
Anion Gap: 8 mEq/L (ref 3–11)
BILIRUBIN TOTAL: 0.27 mg/dL (ref 0.20–1.20)
BUN: 6.1 mg/dL — AB (ref 7.0–26.0)
CALCIUM: 8.7 mg/dL (ref 8.4–10.4)
CHLORIDE: 96 meq/L — AB (ref 98–109)
CO2: 30 meq/L — AB (ref 22–29)
CREATININE: 0.7 mg/dL (ref 0.6–1.1)
EGFR: >90 mL/min/{1.73_m2} (ref >90)
Glucose: 90 mg/dl (ref 70–140)
Potassium: 4 mEq/L (ref 3.5–5.1)
Sodium: 134 mEq/L — ABNORMAL LOW (ref 136–145)
Total Protein: 6.7 g/dL (ref 6.4–8.3)

## 2016-09-29 LAB — CBC WITH DIFFERENTIAL/PLATELET
BASO%: 0.4 % (ref 0.0–2.0)
Basophils Absolute: 0 10*3/uL (ref 0.0–0.1)
EOS%: 0.2 % (ref 0.0–7.0)
Eosinophils Absolute: 0 10*3/uL (ref 0.0–0.5)
HEMATOCRIT: 23.6 % — AB (ref 34.8–46.6)
HGB: 7.2 g/dL — ABNORMAL LOW (ref 11.6–15.9)
LYMPH#: 1.2 10*3/uL (ref 0.9–3.3)
LYMPH%: 26.7 % (ref 14.0–49.7)
MCH: 29.3 pg (ref 25.1–34.0)
MCHC: 30.5 g/dL — AB (ref 31.5–36.0)
MCV: 95.9 fL (ref 79.5–101.0)
MONO#: 0.7 10*3/uL (ref 0.1–0.9)
MONO%: 15.3 % — ABNORMAL HIGH (ref 0.0–14.0)
NEUT%: 57.4 % (ref 38.4–76.8)
NEUTROS ABS: 2.7 10*3/uL (ref 1.5–6.5)
Platelets: 374 10*3/uL (ref 145–400)
RBC: 2.46 10*6/uL — AB (ref 3.70–5.45)
RDW: 14.1 % (ref 11.2–14.5)
WBC: 4.6 10*3/uL (ref 3.9–10.3)

## 2016-09-29 LAB — PREPARE RBC (CROSSMATCH)

## 2016-09-29 MED ORDER — FENTANYL 75 MCG/HR TD PT72: 75.0000 ug | MEDICATED_PATCH | TRANSDERMAL | 0 refills | Status: DC

## 2016-09-29 MED ORDER — SODIUM CHLORIDE 0.9% FLUSH
10.0000 mL | INTRAVENOUS | Status: DC | PRN
  Administered 2016-09-29: 10 mL via INTRAVENOUS
  Filled 2016-09-29: qty 10

## 2016-09-29 MED ORDER — HYDROMORPHONE HCL 4 MG PO TABS: 4.0000 mg | ORAL_TABLET | ORAL | 0 refills | Status: DC | PRN

## 2016-09-29 MED ORDER — HEPARIN SOD (PORK) LOCK FLUSH 100 UNIT/ML IV SOLN
500.0000 [IU] | Freq: Once | INTRAVENOUS | Status: AC
  Administered 2016-09-29: 500 [IU] via INTRAVENOUS
  Filled 2016-09-29: qty 5

## 2016-09-29 MED ORDER — FENTANYL 75 MCG/HR TD PT72: 150.0000 ug | MEDICATED_PATCH | TRANSDERMAL | 0 refills | Status: DC

## 2016-09-29 MED ORDER — LORAZEPAM 0.5 MG PO TABS: 0.5000 mg | ORAL_TABLET | Freq: Two times a day (BID) | ORAL | 0 refills | Status: DC | PRN

## 2016-09-29 MED ORDER — FENTANYL 100 MCG/HR TD PT72: 100.0000 ug | MEDICATED_PATCH | TRANSDERMAL | 0 refills | Status: DC

## 2016-09-29 MED FILL — fentaNYL 100 MCG/HR PT72: 100 | 30 days supply | Qty: 10 | Fill #0

## 2016-09-29 MED FILL — LORazepam 0.5 MG TABS: 0.5 | 10 days supply | Qty: 20 | Fill #0

## 2016-09-29 MED FILL — HYDROmorphone HCL 4 MG TABS: 4 | 2 days supply | Qty: 30 | Fill #0

## 2016-09-29 NOTE — Progress Notes (Signed)
Pt reports that she is still having a lot of pain with her current fentanyl patch and dilaudid. Pt came in today for labs for t&s for 2 units prbc tomorrow. Notified Dr.Gudena of pt complaints and obtained orders to increase her fentanyl dose to 194mcg q3 days instead of 141mcg.   Instructed pt to apply 1 (174mcg) and 1 (89mcg) patch every 3 days. She should have a supply for 30 days. (10 patches each). Pt verbalized understanding. Notified pt that she will be getting 2 units of prbc tomorrow at the [facility] at 8am tomorrow. Printed pt schedule with time/date/location.

## 2016-09-29 NOTE — Telephone Encounter (Signed)
sw pt to confirm lab appt 9/5 and infusion 9/8 per sch msg. Unable to sch both appts today due to over capacity in infusion

## 2016-09-29 NOTE — Patient Instructions (Signed)

## 2016-09-30 ENCOUNTER — Ambulatory Visit (HOSPITAL_COMMUNITY)
Admission: RE | Admit: 2016-09-30 | Discharge: 2016-09-30 | Disposition: A | Discharge status: Home / Self Care | Payer: BLUE CROSS/BLUE SHIELD | Source: Ambulatory Visit | Attending: Hematology and Oncology | Admitting: Hematology and Oncology

## 2016-09-30 ENCOUNTER — Telehealth: Payer: Self-pay | Admitting: *Deleted

## 2016-09-30 ENCOUNTER — Telehealth: Payer: Self-pay | Admitting: Hematology and Oncology

## 2016-09-30 DIAGNOSIS — D649 Anemia, unspecified: Secondary | ICD-10-CM | POA: Diagnosis not present

## 2016-09-30 MED ORDER — HEPARIN SOD (PORK) LOCK FLUSH 100 UNIT/ML IV SOLN
250.0000 [IU] | INTRAVENOUS | Status: AC | PRN
  Administered 2016-09-30: 250 [IU]
  Filled 2016-09-30: qty 5

## 2016-09-30 MED ORDER — SODIUM CHLORIDE 0.9% FLUSH
10.0000 mL | INTRAVENOUS | Status: AC | PRN
  Administered 2016-09-30: 10 mL

## 2016-09-30 MED ORDER — SODIUM CHLORIDE 0.9 % IV SOLN
250.0000 mL | Freq: Once | INTRAVENOUS | Status: AC
  Administered 2016-09-30: 250 mL via INTRAVENOUS

## 2016-09-30 MED ORDER — DIPHENHYDRAMINE HCL 25 MG PO CAPS
25.0000 mg | ORAL_CAPSULE | Freq: Once | ORAL | Status: AC
  Administered 2016-09-30: 25 mg via ORAL
  Filled 2016-09-30: qty 1

## 2016-09-30 MED ORDER — ACETAMINOPHEN 325 MG PO TABS
650.0000 mg | ORAL_TABLET | Freq: Once | ORAL | Status: AC
  Administered 2016-09-30: 650 mg via ORAL
  Filled 2016-09-30: qty 2

## 2016-09-30 MED ORDER — SODIUM CHLORIDE 0.9 % IV SOLN: 250.0000 mL | Freq: Once | INTRAVENOUS | Status: AC

## 2016-09-30 NOTE — Progress Notes (Signed)
Provider: Jennette Kettle  Diagnosis: Anemia, unspecified type (D64.9)  Treatment: 2 units of PRBC's via Picc Line  Patient tolerated procedure well with no transfusion reaction. Discharge instructions given to patient and patient states an understanding. Patient alert, oriented, and ambulatory at time of discharge.

## 2016-09-30 NOTE — Telephone Encounter (Signed)
Thank you, I meant to cancel that.

## 2016-09-30 NOTE — Telephone Encounter (Signed)
FYI "[employer], [place], [phone].  Calling to report panic lab results.   Patient's HGB = 6.8, Hct.+ 20.9, RBC = 2.3.  I will also fax results to [phone]."

## 2016-09-30 NOTE — Telephone Encounter (Signed)
Patient canceled her blood infusion appointment for Saturday 9/8

## 2016-09-30 NOTE — Discharge Instructions (Signed)

## 2016-10-01 LAB — BPAM RBC
BLOOD PRODUCT EXPIRATION DATE: 201809222359
Blood Product Expiration Date: 201809232359
ISSUE DATE / TIME: 201809060808
ISSUE DATE / TIME: 201809060808
Unit Type and Rh: 6200
Unit Type and Rh: 6200

## 2016-10-01 LAB — TYPE AND SCREEN
ABO/RH(D): A POS
Antibody Screen: NEGATIVE
Unit division: 0
Unit division: 0

## 2016-10-02 ENCOUNTER — Emergency Department (HOSPITAL_COMMUNITY): Payer: BLUE CROSS/BLUE SHIELD

## 2016-10-02 ENCOUNTER — Inpatient Hospital Stay (HOSPITAL_COMMUNITY)
Admission: EM | Admit: 2016-10-02 | Discharge: 2016-10-04 | DRG: 947 | Disposition: A | Discharge status: Home / Self Care | Payer: BLUE CROSS/BLUE SHIELD | Attending: Internal Medicine | Admitting: Internal Medicine

## 2016-10-02 ENCOUNTER — Encounter (HOSPITAL_COMMUNITY): Payer: Self-pay | Admitting: Emergency Medicine

## 2016-10-02 ENCOUNTER — Encounter: Admit: 2016-10-02 | Discharge: 2016-10-03

## 2016-10-02 ENCOUNTER — Encounter: Admit: 2016-10-02 | Discharge: 2016-10-02

## 2016-10-02 DIAGNOSIS — R69 Illness, unspecified: Principal | ICD-10-CM

## 2016-10-02 DIAGNOSIS — K5900 Constipation, unspecified: Secondary | ICD-10-CM

## 2016-10-02 DIAGNOSIS — G893 Neoplasm related pain (acute) (chronic): Principal | ICD-10-CM | POA: Diagnosis present

## 2016-10-02 DIAGNOSIS — R188 Other ascites: Secondary | ICD-10-CM | POA: Diagnosis not present

## 2016-10-02 DIAGNOSIS — F329 Major depressive disorder, single episode, unspecified: Secondary | ICD-10-CM | POA: Diagnosis not present

## 2016-10-02 DIAGNOSIS — D649 Anemia, unspecified: Secondary | ICD-10-CM | POA: Diagnosis present

## 2016-10-02 DIAGNOSIS — D75839 Thrombocytosis, unspecified: Secondary | ICD-10-CM | POA: Diagnosis present

## 2016-10-02 DIAGNOSIS — Z818 Family history of other mental and behavioral disorders: Secondary | ICD-10-CM

## 2016-10-02 DIAGNOSIS — D61818 Other pancytopenia: Secondary | ICD-10-CM | POA: Diagnosis present

## 2016-10-02 DIAGNOSIS — R109 Unspecified abdominal pain: Secondary | ICD-10-CM | POA: Diagnosis not present

## 2016-10-02 DIAGNOSIS — R1030 Lower abdominal pain, unspecified: Secondary | ICD-10-CM | POA: Diagnosis present

## 2016-10-02 DIAGNOSIS — Z6822 Body mass index (BMI) 22.0-22.9, adult: Secondary | ICD-10-CM

## 2016-10-02 DIAGNOSIS — Z923 Personal history of irradiation: Secondary | ICD-10-CM

## 2016-10-02 DIAGNOSIS — E46 Unspecified protein-calorie malnutrition: Secondary | ICD-10-CM | POA: Diagnosis not present

## 2016-10-02 DIAGNOSIS — Z853 Personal history of malignant neoplasm of breast: Secondary | ICD-10-CM

## 2016-10-02 DIAGNOSIS — E43 Unspecified severe protein-calorie malnutrition: Secondary | ICD-10-CM | POA: Diagnosis present

## 2016-10-02 DIAGNOSIS — Z79899 Other long term (current) drug therapy: Secondary | ICD-10-CM

## 2016-10-02 DIAGNOSIS — Z87891 Personal history of nicotine dependence: Secondary | ICD-10-CM

## 2016-10-02 DIAGNOSIS — T402X5A Adverse effect of other opioids, initial encounter: Secondary | ICD-10-CM | POA: Diagnosis present

## 2016-10-02 DIAGNOSIS — D473 Essential (hemorrhagic) thrombocythemia: Secondary | ICD-10-CM | POA: Diagnosis present

## 2016-10-02 DIAGNOSIS — C50919 Malignant neoplasm of unspecified site of unspecified female breast: Secondary | ICD-10-CM | POA: Diagnosis not present

## 2016-10-02 DIAGNOSIS — F419 Anxiety disorder, unspecified: Secondary | ICD-10-CM | POA: Diagnosis present

## 2016-10-02 DIAGNOSIS — Z90722 Acquired absence of ovaries, bilateral: Secondary | ICD-10-CM

## 2016-10-02 DIAGNOSIS — K5903 Drug induced constipation: Secondary | ICD-10-CM | POA: Diagnosis present

## 2016-10-02 DIAGNOSIS — Z9079 Acquired absence of other genital organ(s): Secondary | ICD-10-CM

## 2016-10-02 DIAGNOSIS — C7951 Secondary malignant neoplasm of bone: Secondary | ICD-10-CM | POA: Diagnosis present

## 2016-10-02 DIAGNOSIS — R180 Malignant ascites: Secondary | ICD-10-CM | POA: Diagnosis present

## 2016-10-02 DIAGNOSIS — F32A Depression, unspecified: Secondary | ICD-10-CM | POA: Diagnosis present

## 2016-10-02 DIAGNOSIS — D638 Anemia in other chronic diseases classified elsewhere: Secondary | ICD-10-CM | POA: Diagnosis present

## 2016-10-02 DIAGNOSIS — Z7189 Other specified counseling: Secondary | ICD-10-CM

## 2016-10-02 DIAGNOSIS — E279 Disorder of adrenal gland, unspecified: Secondary | ICD-10-CM | POA: Diagnosis present

## 2016-10-02 DIAGNOSIS — R600 Localized edema: Secondary | ICD-10-CM | POA: Diagnosis present

## 2016-10-02 DIAGNOSIS — R1084 Generalized abdominal pain: Secondary | ICD-10-CM

## 2016-10-02 DIAGNOSIS — Z515 Encounter for palliative care: Secondary | ICD-10-CM

## 2016-10-02 DIAGNOSIS — K921 Melena: Secondary | ICD-10-CM | POA: Diagnosis present

## 2016-10-02 DIAGNOSIS — Z9221 Personal history of antineoplastic chemotherapy: Secondary | ICD-10-CM

## 2016-10-02 DIAGNOSIS — C786 Secondary malignant neoplasm of retroperitoneum and peritoneum: Secondary | ICD-10-CM | POA: Diagnosis present

## 2016-10-02 DIAGNOSIS — Z66 Do not resuscitate: Secondary | ICD-10-CM | POA: Diagnosis present

## 2016-10-02 HISTORY — DX: Anemia, unspecified: D64.9

## 2016-10-02 LAB — URINALYSIS, ROUTINE W REFLEX MICROSCOPIC
BILIRUBIN URINE: NEGATIVE
Glucose, UA: NEGATIVE mg/dL
HGB URINE DIPSTICK: NEGATIVE
Ketones, ur: NEGATIVE mg/dL
LEUKOCYTES UA: NEGATIVE
NITRITE: NEGATIVE
PROTEIN: NEGATIVE mg/dL
pH: 7 (ref 5.0–8.0)

## 2016-10-02 LAB — COMPREHENSIVE METABOLIC PANEL
ALT: 22 U/L (ref 14–54)
AST: 31 U/L (ref 15–41)
Albumin: 2.2 g/dL — ABNORMAL LOW (ref 3.5–5.0)
Alkaline Phosphatase: 95 U/L (ref 38–126)
Anion gap: 10 (ref 5–15)
BUN: 7 mg/dL (ref 6–20)
CHLORIDE: 97 mmol/L — AB (ref 101–111)
CO2: 30 mmol/L (ref 22–32)
CREATININE: 0.6 mg/dL (ref 0.44–1.00)
Calcium: 8.5 mg/dL — ABNORMAL LOW (ref 8.9–10.3)
Glucose, Bld: 94 mg/dL (ref 65–99)
POTASSIUM: 4.1 mmol/L (ref 3.5–5.1)
Sodium: 137 mmol/L (ref 135–145)
Total Bilirubin: 0.4 mg/dL (ref 0.3–1.2)
Total Protein: 7.1 g/dL (ref 6.5–8.1)

## 2016-10-02 LAB — TYPE AND SCREEN
ABO/RH(D): A POS
ANTIBODY SCREEN: NEGATIVE

## 2016-10-02 LAB — AMMONIA: AMMONIA: 15 umol/L (ref 9–35)

## 2016-10-02 LAB — CBC WITH DIFFERENTIAL/PLATELET
BASOS ABS: 0 10*3/uL (ref 0.0–0.1)
Basophils Relative: 0 %
EOS ABS: 0 10*3/uL (ref 0.0–0.7)
EOS PCT: 0 %
HCT: 32.2 % — ABNORMAL LOW (ref 36.0–46.0)
Hemoglobin: 10.3 g/dL — ABNORMAL LOW (ref 12.0–15.0)
LYMPHS ABS: 1.5 10*3/uL (ref 0.7–4.0)
Lymphocytes Relative: 21 %
MCH: 29.3 pg (ref 26.0–34.0)
MCHC: 32 g/dL (ref 30.0–36.0)
MCV: 91.7 fL (ref 78.0–100.0)
MONO ABS: 0.6 10*3/uL (ref 0.1–1.0)
Monocytes Relative: 9 %
Neutro Abs: 4.9 10*3/uL (ref 1.7–7.7)
Neutrophils Relative %: 70 %
PLATELETS: 446 10*3/uL — AB (ref 150–400)
RBC: 3.51 MIL/uL — AB (ref 3.87–5.11)
RDW: 14.9 % (ref 11.5–15.5)
WBC: 7.1 10*3/uL (ref 4.0–10.5)

## 2016-10-02 LAB — POC OCCULT BLOOD, ED: FECAL OCCULT BLD: NEGATIVE

## 2016-10-02 LAB — I-STAT CG4 LACTIC ACID, ED: LACTIC ACID, VENOUS: 0.74 mmol/L (ref 0.5–1.9)

## 2016-10-02 MED ORDER — FENTANYL 100 MCG/HR TD PT72
100.0000 ug | MEDICATED_PATCH | TRANSDERMAL | Status: DC
  Administered 2016-10-02: 100 ug via TRANSDERMAL
  Filled 2016-10-02: qty 1

## 2016-10-02 MED ORDER — DIPHENHYDRAMINE HCL 25 MG PO CAPS
25.0000 mg | ORAL_CAPSULE | Freq: Every evening | ORAL | Status: DC | PRN
Start: 2016-10-02 — End: 2016-10-04

## 2016-10-02 MED ORDER — FENTANYL 25 MCG/HR TD PT72
75.0000 ug | MEDICATED_PATCH | TRANSDERMAL | Status: DC
  Administered 2016-10-02: 75 ug via TRANSDERMAL
  Filled 2016-10-02: qty 3

## 2016-10-02 MED ORDER — ENSURE ENLIVE PO LIQD: 237.0000 mL | Freq: Two times a day (BID) | ORAL | Status: DC

## 2016-10-02 MED ORDER — BUPROPION HCL ER (XL) 150 MG PO TB24
150.0000 mg | ORAL_TABLET | Freq: Every day | ORAL | Status: DC
  Administered 2016-10-03 – 2016-10-04 (×2): 150 mg via ORAL
  Filled 2016-10-02 (×2): qty 1

## 2016-10-02 MED ORDER — CALCIUM CARBONATE-VITAMIN D 500-200 MG-UNIT PO TABS
2.0000 | ORAL_TABLET | Freq: Every day | ORAL | Status: DC
Start: 2016-10-03 — End: 2016-10-04
  Administered 2016-10-03 – 2016-10-04 (×2): 2 via ORAL
  Filled 2016-10-02 (×2): qty 2

## 2016-10-02 MED ORDER — ENOXAPARIN SODIUM 40 MG/0.4ML ~~LOC~~ SOLN
40.0000 mg | SUBCUTANEOUS | Status: DC
  Administered 2016-10-02 – 2016-10-03 (×2): 40 mg via SUBCUTANEOUS
  Filled 2016-10-02 (×2): qty 0.4

## 2016-10-02 MED ORDER — MORPHINE SULFATE (PF) 2 MG/ML IV SOLN
2.0000 mg | Freq: Once | INTRAVENOUS | Status: DC
  Filled 2016-10-02: qty 1

## 2016-10-02 MED ORDER — SODIUM CHLORIDE 0.9 % IV BOLUS (SEPSIS)
1000.0000 mL | Freq: Once | INTRAVENOUS | Status: AC
  Administered 2016-10-02: 500 mL via INTRAVENOUS

## 2016-10-02 MED ORDER — ACETAMINOPHEN 325 MG PO TABS: 650.0000 mg | ORAL_TABLET | Freq: Four times a day (QID) | ORAL | Status: DC | PRN

## 2016-10-02 MED ORDER — HYDROMORPHONE HCL 1 MG/ML IJ SOLN
1.0000 mg | Freq: Once | INTRAMUSCULAR | Status: AC
  Administered 2016-10-02: 1 mg via INTRAVENOUS
  Filled 2016-10-02: qty 1

## 2016-10-02 MED ORDER — IOPAMIDOL (ISOVUE-300) INJECTION 61%
INTRAVENOUS | Status: AC
  Filled 2016-10-02: qty 100

## 2016-10-02 MED ORDER — ACETAMINOPHEN 650 MG RE SUPP: 650.0000 mg | Freq: Four times a day (QID) | RECTAL | Status: DC | PRN

## 2016-10-02 MED ORDER — BISACODYL 5 MG PO TBEC: 5.0000 mg | DELAYED_RELEASE_TABLET | Freq: Every day | ORAL | Status: DC | PRN

## 2016-10-02 MED ORDER — HYDROCORTISONE 2.5 % RE CREA: 1.0000 "application " | TOPICAL_CREAM | Freq: Every day | RECTAL | Status: DC | PRN

## 2016-10-02 MED ORDER — FLEET ENEMA 7-19 GM/118ML RE ENEM: 1.0000 | ENEMA | Freq: Every day | RECTAL | Status: DC | PRN

## 2016-10-02 MED ORDER — SODIUM CHLORIDE 0.9 % IV BOLUS (SEPSIS): 500.0000 mL | Freq: Once | INTRAVENOUS | Status: DC

## 2016-10-02 MED ORDER — MORPHINE SULFATE (PF) 4 MG/ML IV SOLN
4.0000 mg | Freq: Once | INTRAVENOUS | Status: AC
  Administered 2016-10-02: 4 mg via INTRAVENOUS
  Filled 2016-10-02: qty 1

## 2016-10-02 MED ORDER — ONDANSETRON HCL 4 MG/2ML IJ SOLN
4.0000 mg | Freq: Once | INTRAMUSCULAR | Status: AC
  Administered 2016-10-02: 4 mg via INTRAVENOUS
  Filled 2016-10-02: qty 2

## 2016-10-02 MED ORDER — LORAZEPAM 0.5 MG PO TABS: 0.5000 mg | ORAL_TABLET | Freq: Two times a day (BID) | ORAL | Status: DC | PRN

## 2016-10-02 MED ORDER — POLYETHYLENE GLYCOL 3350 17 G PO PACK: 17.0000 g | PACK | Freq: Every day | ORAL | Status: DC

## 2016-10-02 MED ORDER — PANTOPRAZOLE SODIUM 40 MG PO TBEC
40.0000 mg | DELAYED_RELEASE_TABLET | Freq: Every day | ORAL | Status: DC
  Administered 2016-10-02 – 2016-10-03 (×2): 40 mg via ORAL
  Filled 2016-10-02 (×2): qty 1

## 2016-10-02 MED ORDER — HYDROMORPHONE HCL-NACL 0.5-0.9 MG/ML-% IV SOSY
1.0000 mg | PREFILLED_SYRINGE | INTRAVENOUS | Status: DC | PRN
  Administered 2016-10-02 – 2016-10-04 (×7): 2 mg via INTRAVENOUS
  Filled 2016-10-02 (×7): qty 4

## 2016-10-02 MED ORDER — DOCUSATE SODIUM 100 MG PO CAPS
200.0000 mg | ORAL_CAPSULE | Freq: Two times a day (BID) | ORAL | Status: DC
  Administered 2016-10-02 – 2016-10-03 (×2): 200 mg via ORAL
  Filled 2016-10-02 (×2): qty 2

## 2016-10-02 MED ORDER — IOPAMIDOL (ISOVUE-300) INJECTION 61%
100.0000 mL | Freq: Once | INTRAVENOUS | Status: AC | PRN
  Administered 2016-10-02: 100 mL via INTRAVENOUS

## 2016-10-02 NOTE — H&P (Signed)
History and Physical    Tamara Byrd HYQ:[id number] DOB: 1965-10-17 DOA: 10/02/2016  PCP: Olena Mater, MD   Patient coming from: Home  Chief Complaint: Abdominal pain, constipation  HPI: Tamara Byrd is a 51 y.o. female with medical history significant for breast cancer with intra-abdominal and osseous metastases, now presenting to the emergency department for evaluation of intolerable abdominal pain. Patient has unfortunately experienced a recurrence in her breast cancer with metastatic disease, complicated by severe pancytopenia leading to chemotherapy being held, and also complicated by severe pain, mainly in the lower abdomen. The patient reports constant, severe, spasmodic and sharp pain in the lower abdomen. This is exacerbated by any movement, and she suspects the current exacerbation is secondary to constipation. She uses fentanyl patch and oral Dilaudid at home, also using ice and heat. She has been following dietary and medication recommendations for her constipation, but has been unable to move her bowels in the past 3 days. She denies any fevers or chills, denies chest pain or palpitations, and denies dyspnea or cough. Patient was admitted to the hospital last month with SBP and has recently completed a course of Rocephin. She was transfused 2 units of packed red blood cells at the cancer Center 2 days prior to this admission.   ED Course: Upon arrival to the ED, patient is found to be afebrile, saturating well on room air, and with vitals otherwise stable. Chemistry panel features and albumin of 2.2 which is improved from recent priors. CBC is notable for improved normocytic anemia with hemoglobin of 10.3, and a mild thrombocytosis with platelets 446,000. Lactic acid is reassuring at 0.74, and fecal occult blood testing is negative. Chest x-ray is negative for acute cardiopulmonary disease and KUB is negative for obstruction or ileus. CT of the abdomen and pelvis was performed and  demonstrates the intraperitoneal metastatic disease with malignant ascites. Also noted on the CT is a loculated collection, possibly reflecting her residual malignant ascites, possibly developing abscess. 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 Blood cultures were collected in the ED, 1 L of normal saline was given, and the patient was treated with multiple doses of IV Dilaudid, morphine, and Zofran. She remained hemodynamically stable and in no apparent respiratory distress, but continues to experience unbearable pain and will be observed on the medical-surgical unit for ongoing evaluation and management of this.  Review of Systems:  All other systems reviewed and apart from HPI, are negative.  Past Medical History:  Diagnosis Date  . Anemia 10/02/2016  . Breast cancer ([place])   . Depression   . Medical history non-contributory     Past Surgical History:  Procedure Laterality Date  . ABDOMINOPLASTY  1998  . BREAST REDUCTION SURGERY  1998  . CESAREAN SECTION     x 2  . CYST EXCISION  2013   from wrist  . IR REMOVAL PERM PERITONEAL CATH  09/13/2016  . LAPAROSCOPY N/A 05/01/2012   Procedure: LAPAROSCOPY OPERATIVE with right  salpingectomy;  Surgeon: Azalia Bilis, MD;  Location: [facility];  Service: Gynecology;  Laterality: N/A;  . SALPINGOOPHORECTOMY Left 05/01/2012   Procedure: SALPINGO OOPHORECTOMY ;  Surgeon: Azalia Bilis, MD;  Location: [facility];  Service: Gynecology;  Laterality: Left;  . TUBAL LIGATION  1994     reports that she has quit smoking. She has never  used smokeless tobacco. She reports that she does not drink alcohol or use drugs.  No Known Allergies  Family History  Problem Relation Age of Onset  . Depression Mother      Prior to Admission medications   Medication Sig Start Date End Date Taking? Authorizing  Provider  buPROPion (WELLBUTRIN XL) 150 MG 24 hr tablet Take 150 mg by mouth daily.   Yes [provider]  Calcium Carbonate-Vit D-Min (CALCIUM 1200) 1200-1000 MG-UNIT CHEW Chew 1 tablet by mouth daily. 08/27/16  Yes Nicholas Lose, MD  diphenhydrAMINE (BENADRYL) 25 mg capsule Take 1 capsule (25 mg total) by mouth at bedtime as needed for sleep. 09/16/16  Yes Domenic Polite, MD  docusate sodium (COLACE) 100 MG capsule Take 200 mg by mouth 2 (two) times daily.   Yes [provider]  fentaNYL (DURAGESIC - DOSED MCG/HR) 100 MCG/HR Place 1 patch (100 mcg total) onto the skin every 3 (three) days. 09/29/16  Yes Nicholas Lose, MD  fentaNYL (DURAGESIC - DOSED MCG/HR) 75 MCG/HR Place 1 patch (75 mcg total) onto the skin every 3 (three) days. 09/29/16  Yes Nicholas Lose, MD  furosemide (LASIX) 40 MG tablet Take 1 tablet (40 mg total) by mouth 2 (two) times daily. 09/16/16  Yes Domenic Polite, MD  HYDROmorphone (DILAUDID) 4 MG tablet Take 1-2 tablets (4-8 mg total) by mouth every 3 (three) hours as needed for moderate pain or severe pain. 09/29/16  Yes Nicholas Lose, MD  LORazepam (ATIVAN) 0.5 MG tablet Take 1 tablet (0.5 mg total) by mouth 2 (two) times daily as needed (anxiety or sleep). 09/29/16  Yes Nicholas Lose, MD  ondansetron (ZOFRAN) 8 MG tablet Take 1 tablet (8 mg total) by mouth every 8 (eight) hours as needed for nausea or vomiting. 09/16/16  Yes Domenic Polite, MD  pantoprazole (PROTONIX) 40 MG tablet Take 1 tablet (40 mg total) by mouth at bedtime. 09/16/16  Yes Domenic Polite, MD  polyethylene glycol [facility] / GLYCOLAX) packet Take 17 g by mouth daily. 09/15/16  Yes Lavina Hamman, MD  potassium chloride SA (K-DUR,KLOR-CON) 20 MEQ tablet Take 2 tablets (40 mEq total) by mouth daily. Patient taking differently: Take 20 mEq by mouth 2 (two) times daily.  09/16/16  Yes Domenic Polite, MD  PROCTOZONE-HC 2.5 % rectal cream Apply 1 application topically daily as needed. 09/14/16  Yes [provider]  Methylnaltrexone Bromide (RELISTOR) 150 MG TABS Take 150 mg by mouth daily. Patient not taking: Reported on 10/02/2016 09/16/16   Domenic Polite, MD  palbociclib [facility]) 100 MG capsule Take 1 capsule (100 mg total) by mouth daily with breakfast. Take whole with food. Take for 3 weeks on, 1 week off. Patient not taking: Reported on 10/02/2016 09/20/16   Nicholas Lose, MD    Physical Exam: Vitals:   10/02/16 1322 10/02/16 1610 10/02/16 1625 10/02/16 1753  BP:   115/90 118/88  Pulse:   99 96  Resp:   18 18  Temp:  99.3 F (37.4 C)    TempSrc:  Rectal    SpO2:   95% 96%  Weight: 64.4 kg (142 lb)     Height: 5\' 4"  (1.626 m)         Constitutional: NAD, calm, appears uncomfortable, chronically-ill Eyes: PERTLA, lids and conjunctivae normal ENMT: Mucous membranes are dry. Posterior pharynx clear of any exudate or lesions.   Neck: normal, supple, no masses, no thyromegaly Respiratory: clear to auscultation bilaterally, no wheezing, no crackles. Normal respiratory effort.  Cardiovascular: S1 & S2 heard, regular rate and rhythm. No significant JVD. Abdomen: No distension, exquisite tenderness in lower quadrants. No guarding. Bowel sounds active.  Musculoskeletal: no clubbing / cyanosis. No joint deformity upper and lower extremities.    Skin: no significant rashes, lesions, ulcers. Poor turgor. Neurologic: CN 2-12 grossly intact. Sensation intact. Strength 5/5 in all 4 limbs.  Psychiatric: Alert and oriented x 3. Pleasant and cooperative.     Labs on Admission: I have personally reviewed following labs and imaging studies  CBC:  Recent Labs Lab 09/29/16 1030 10/02/16 1510  WBC 4.6 7.1  NEUTROABS 2.7 4.9  HGB 7.2* 10.3*  HCT 23.6* 32.2*  MCV 95.9 91.7  PLT 374 865*   Basic Metabolic Panel:  Recent Labs Lab 09/29/16 1031 10/02/16 1510  NA 134* 137  K 4.0 4.1  CL  --  97*  CO2 30* 30  GLUCOSE 90 94  BUN 6.1* 7  CREATININE 0.7 0.60  CALCIUM 8.7 8.5*    GFR: Estimated Creatinine Clearance: 71 mL/min (by C-G formula based on SCr of 0.6 mg/dL). Liver Function Tests:  Recent Labs Lab 09/29/16 1031 10/02/16 1510  AST 38* 31  ALT 19 22  ALKPHOS 107 95  BILITOT 0.27 0.4  PROT 6.7 7.1  ALBUMIN 1.8* 2.2*   No results for input(s): LIPASE, AMYLASE in the last 168 hours.  Recent Labs Lab 10/02/16 1510  AMMONIA 15   Coagulation Profile: No results for input(s): INR, PROTIME in the last 168 hours. Cardiac Enzymes: No results for input(s): CKTOTAL, CKMB, CKMBINDEX, TROPONINI in the last 168 hours. BNP (last 3 results) No results for input(s): PROBNP in the last 8760 hours. HbA1C: No results for input(s): HGBA1C in the last 72 hours. CBG: No results for input(s): GLUCAP in the last 168 hours. Lipid Profile: No results for input(s): CHOL, HDL, LDLCALC, TRIG, CHOLHDL, LDLDIRECT in the last 72 hours. Thyroid Function Tests: No results for input(s): TSH, T4TOTAL, FREET4, T3FREE, THYROIDAB in the last 72 hours. Anemia Panel: No results for input(s): VITAMINB12, FOLATE, FERRITIN, TIBC, IRON, RETICCTPCT in the last 72 hours. Urine analysis:    Component Value Date/Time   COLORURINE YELLOW 09/09/2016 1629   APPEARANCEUR HAZY (A) 09/09/2016 1629   LABSPEC 1.010 09/09/2016 1629   PHURINE 5.0 09/09/2016 1629   GLUCOSEU NEGATIVE 09/09/2016 1629   HGBUR SMALL (A) 09/09/2016 1629   BILIRUBINUR NEGATIVE 09/09/2016 1629   KETONESUR 5 (A) 09/09/2016 1629   PROTEINUR NEGATIVE 09/09/2016 1629   NITRITE NEGATIVE 09/09/2016 1629   LEUKOCYTESUR SMALL (A) 09/09/2016 1629   Sepsis Labs: @LABRCNTIP (procalcitonin:4,lacticidven:4) )No results found for this or any previous visit (from the past 240 hour(s)).   Radiological Exams on Admission: Ct Abdomen Pelvis W Contrast  Result Date: 10/02/2016 CLINICAL DATA:  51 year old female with history of metastatic breast cancer. Constipation. No bowel movement in the past 3 days. Nausea. EXAM: CT  ABDOMEN AND PELVIS WITH CONTRAST TECHNIQUE: Multidetector CT imaging of the abdomen and pelvis was performed using the standard protocol following bolus administration of intravenous contrast. CONTRAST:  168mL ISOVUE-300 IOPAMIDOL (ISOVUE-300) INJECTION 61% COMPARISON:  CT of the abdomen and pelvis 09/09/2016. FINDINGS: Lower chest: Status post right mastectomy with right-sided breast implant incidentally noted. Tip of central venous catheter in the right atrium. Trace right pleural effusion with some subsegmental atelectasis or scarring in the right lower lobe. Hepatobiliary: A few tiny subcentimeter low-attenuation lesions are noted in the right lobe of the liver, too small to characterize. No larger more  suspicious appearing hepatic lesions are noted. No intra or extrahepatic biliary ductal dilatation. Gallbladder is moderately distended, but otherwise unremarkable in appearance. Pancreas: No pancreatic mass. No pancreatic ductal dilatation. No pancreatic or peripancreatic fluid or inflammatory changes. Spleen: Unremarkable. Adrenals/Urinary Tract: 4 mm nonobstructive calculus in the upper pole collecting system of the right kidney. Left kidney is normal in appearance. No hydroureteronephrosis. Urinary bladder is moderately distended, but otherwise unremarkable in appearance. Large bilateral adrenal lesions are again noted, most evident on the right side where there is a 2.8 x 5.4 x 6.3 cm right adrenal mass (axial image 18 of series 2 and coronal image 73 of series 7) which appears to potentially directly invade the adjacent inferior vena cava. Stomach/Bowel: The appearance of the stomach is normal. No pathologic dilatation of small bowel or colon. Moderate volume of well-formed stool throughout the distal colon and rectum, compatible with the reported clinical history of constipation. Appendix is not confidently identified. There is again an unusual collection of gas anterior to the liver best appreciated on  axial image 3 of series 2 and coronal image 27 of series 7, which appears to arise from a loop of small bowel, very similar to prior study 09/09/2016. Vascular/Lymphatic: Aortic atherosclerosis, without evidence of aneurysm or dissection in the abdominal or pelvic vasculature. Multiple borderline enlarged retroperitoneal lymph nodes measuring up to 7 mm in short axis are noted. Reproductive: Uterus and left ovary are unremarkable in appearance. Enhancing soft tissue mass is intimately associated with the right adnexa, as detailed below, similar to the prior study. Other: Large heterogeneously enhancing soft tissue masses in the right lower quadrant intimately associated with the left adnexa. The largest of these is medial measuring up to 5.6 x 5.8 cm, while the smaller lateral lesion measures 5.2 x 4.6 cm (axial images 57 and 55 of series 2 respectively). Large volume of ascites with extensive thickening and enhancement of the peritoneal membranes, with the largest collection of this fluid in the anterior aspect of the abdomen adjacent to the previously described enhancing soft tissue lesions. Overall, the volume of ascites has decreased compared to the prior study, but now appears more focally loculated. Previously noted peritoneal drainage catheter has been removed. Other than the collection of gas anterior to the liver which could conceivably represent some loculated pneumoperitoneum, there is no gross pneumoperitoneum elsewhere. Musculoskeletal: Again noted are several mixed lytic and sclerotic lesions throughout the visualized axial and appendicular skeleton, with the largest of these again in the T8 vertebral body where there is a pathologic compression fracture and approximately 20% loss of anterior vertebral body height, similar to the prior study. IMPRESSION: 1. Overall, although the volume of malignant ascites has decreased compared to the prior study, and now appears focally loculated most evident in the  anterior aspect of the abdomen/pelvis, surrounded by thickened and enhancing peritoneal membranes. This could simply be secondary to malignancy, however, the possibility of infection of this fluid following drainage with developing abscess is not entirely excluded. 2. Persistent gas collection anterior to the liver, very similar in appearance to the prior study with apparent communication with small bowel. This is unusual in appearance, and the possibility of focally contained perforated loop of distal small bowel remains of concern. However, a large diverticulum of the small bowel could also be considered, particularly in light of the stability of this finding. 3. 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 Tiny subcentimeter low-attenuation lesions in the right lobe of the liver are too small to definitively characterize, but could represent additional sites of metastatic disease. Attention on followup studies is recommended. 4. Aortic atherosclerosis. Electronically Signed   By: Vinnie Langton M.D.   On: 10/02/2016 18:17   Dg Abd Acute W/chest  Result Date: 10/02/2016 CLINICAL DATA:  Abdominal pain.  Vomiting. EXAM: DG ABDOMEN ACUTE W/ 1V CHEST COMPARISON:  None. FINDINGS: There is no evidence of dilated bowel loops or free intraperitoneal air. No radiopaque calculi or other significant radiographic abnormality is seen. Heart size and mediastinal contours are within normal limits. Both lungs are clear. IMPRESSION: No evidence of bowel obstruction or ileus. No acute cardiopulmonary disease. Electronically Signed   By: Marijo Conception, M.D.   On: 10/02/2016 16:11    EKG: Not performed.   Assessment/Plan  1. 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 Breast cancer with metastases  - Pt is followed by oncology for recurrent breast cancer with intra-abdominal and bony metastases  - Treatment has been complicated by severe pancytopenia and palbociclib has been held  - Treated with radiation and Xgeva for bone involvement  - Oncologist will be notified of the admission   3. Malignant ascites, loculated collection  - Pt has malignant ascites; peritoneal catheter removed last admission as it was not draining the fluid collection and she had SBP  - CT this admission with apparently loculated collection reflecting developing abscess vs residual malignant ascites  - Will consult with IR for drainage    4. Anemia  - Hgb is 10.3 on admission, status-post transfusion of 2 units RBC's on 09/30/16  - No bleeding evident, FOBT neg in ED   5. Depression, anxiety  - Stable - Continue Wellbutrin and prn Ativan    6. Protein-calorie malnutrition  - Serum albumin has improved to 2.2  - Continue dietary supplements    7. 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

## 2016-10-02 NOTE — ED Notes (Signed)
Bed: QH22 Expected date:  Expected time:  Means of arrival:  Comments: sepsis

## 2016-10-02 NOTE — ED Notes (Addendum)
Consulting provider at the bedside. Attempted to call report but no one answered the phone.

## 2016-10-02 NOTE — ED Provider Notes (Signed)
[facility] DEPT Provider Note   CSN: [id number] Arrival date & time: 10/02/16  1302     History   Chief Complaint Chief Complaint  Patient presents with  . Constipation    HPI Tamara Byrd is a 51 y.o. female presenting with constipation and generalized weakness.  Patient presenting with a past medical history significant for MSSA SBP with admission last month, metastatic breast cancer, and blood transfusion 4 days ago.   Patient presents today as she has not had a bowel movement in 3 days. She states every time she feels the need to go, it is very painful and she is straining. Last bowel movement 3 days ago was hard and nonbloody. She reports occasional blood in her stool. She reports that due to the pain, she is also having difficulty urinating. She has had recent issues with constipation due to pain medicine.  Additionally, mom states that patient has been more weak and confused in the past 24 hours. Yesterday evening, she showered with her undergarments on, forgetting that she needed to take them off. This morning, she was sleepier than normal, and was complaining of generalized weakness. Patient denies fevers, chills, chest pain, shortness of breath numbness, tingling.   Discussed with patient her desire for palliative care consult. Patient states that she would only like to talk to palliative care if she is going to continue to have abdominal pain like she is having now.  HPI  Past Medical History:  Diagnosis Date  . Breast cancer ([place])   . Depression   . Medical history non-contributory     Patient Active Problem List   Diagnosis Date Noted  . Pancytopenia ([place]) 09/10/2016  . Intractable abdominal pain 09/09/2016  . Anxiety 09/09/2016  . Malignant ascites 09/09/2016  . Bone metastases ([place]) 08/27/2016  . Metastatic breast cancer ([place]) 07/11/2016  . 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 Depression     Past Surgical History:  Procedure Laterality Date  .  ABDOMINOPLASTY  1998  . BREAST REDUCTION SURGERY  1998  . CESAREAN SECTION     x 2  . CYST EXCISION  2013   from wrist  . IR REMOVAL PERM PERITONEAL CATH  09/13/2016  . LAPAROSCOPY N/A 05/01/2012   Procedure: LAPAROSCOPY OPERATIVE with right  salpingectomy;  Surgeon: Azalia Bilis, MD;  Location: [facility];  Service: Gynecology;  Laterality: N/A;  . SALPINGOOPHORECTOMY Left 05/01/2012   Procedure: SALPINGO OOPHORECTOMY ;  Surgeon: Azalia Bilis, MD;  Location: [facility];  Service: Gynecology;  Laterality: Left;  . TUBAL LIGATION  1994    OB History    Gravida Para Term Preterm AB Living   3 2 2  0 1 2   SAB TAB Ectopic Multiple Live Births   0 0 0   2       Home Medications    Prior to Admission medications   Medication Sig Start Date End Date Taking? Authorizing Provider  buPROPion (WELLBUTRIN XL) 150 MG 24 hr tablet Take 150 mg by mouth daily.   Yes [provider]  Calcium Carbonate-Vit D-Min (CALCIUM 1200) 1200-1000 MG-UNIT CHEW Chew 1 tablet by mouth daily. 08/27/16  Yes Nicholas Lose, MD  diphenhydrAMINE (BENADRYL) 25 mg capsule Take 1 capsule (25 mg total) by mouth at bedtime as needed for sleep. 09/16/16  Yes Domenic Polite, MD  docusate sodium (COLACE) 100 MG capsule Take 200 mg by mouth 2 (two) times daily.   Yes [provider]  fentaNYL (DURAGESIC - DOSED MCG/HR) 100  MCG/HR Place 1 patch (100 mcg total) onto the skin every 3 (three) days. 09/29/16  Yes Nicholas Lose, MD  fentaNYL (DURAGESIC - DOSED MCG/HR) 75 MCG/HR Place 1 patch (75 mcg total) onto the skin every 3 (three) days. 09/29/16  Yes Nicholas Lose, MD  furosemide (LASIX) 40 MG tablet Take 1 tablet (40 mg total) by mouth 2 (two) times daily. 09/16/16  Yes Domenic Polite, MD  HYDROmorphone (DILAUDID) 4 MG tablet Take 1-2 tablets (4-8 mg total) by mouth every 3 (three) hours as needed for moderate pain or severe pain. 09/29/16  Yes Nicholas Lose, MD  LORazepam (ATIVAN) 0.5 MG tablet Take 1 tablet (0.5 mg total)  by mouth 2 (two) times daily as needed (anxiety or sleep). 09/29/16  Yes Nicholas Lose, MD  ondansetron (ZOFRAN) 8 MG tablet Take 1 tablet (8 mg total) by mouth every 8 (eight) hours as needed for nausea or vomiting. 09/16/16  Yes Domenic Polite, MD  pantoprazole (PROTONIX) 40 MG tablet Take 1 tablet (40 mg total) by mouth at bedtime. 09/16/16  Yes Domenic Polite, MD  polyethylene glycol [facility] / GLYCOLAX) packet Take 17 g by mouth daily. 09/15/16  Yes Lavina Hamman, MD  potassium chloride SA (K-DUR,KLOR-CON) 20 MEQ tablet Take 2 tablets (40 mEq total) by mouth daily. Patient taking differently: Take 20 mEq by mouth 2 (two) times daily.  09/16/16  Yes Domenic Polite, MD  PROCTOZONE-HC 2.5 % rectal cream Apply 1 application topically daily as needed. 09/14/16  Yes [provider]  Methylnaltrexone Bromide (RELISTOR) 150 MG TABS Take 150 mg by mouth daily. Patient not taking: Reported on 10/02/2016 09/16/16   Domenic Polite, MD  palbociclib [facility]) 100 MG capsule Take 1 capsule (100 mg total) by mouth daily with breakfast. Take whole with food. Take for 3 weeks on, 1 week off. Patient not taking: Reported on 10/02/2016 09/20/16   Nicholas Lose, MD    Family History Family History  Problem Relation Age of Onset  . Depression Mother     Social History Social History  Substance Use Topics  . Smoking status: Former Research scientist (life sciences)  . Smokeless tobacco: Never Used  . Alcohol use No     Allergies   Patient has no known allergies.   Review of Systems Review of Systems  Constitutional: Negative for chills and fever.  HENT: Negative for congestion and sore throat.   Respiratory: Negative for cough, chest tightness and shortness of breath.   Cardiovascular: Negative for chest pain.  Gastrointestinal: Positive for abdominal distention, abdominal pain, blood in stool, constipation and nausea. Negative for diarrhea and vomiting.  Genitourinary: Negative for dysuria, flank pain and hematuria.    Musculoskeletal: Negative for back pain and neck pain.  Skin: Negative for wound.  Allergic/Immunologic: Positive for immunocompromised state.  Neurological: Positive for weakness (generalized). Negative for headaches.  Hematological: Does not bruise/bleed easily.  Psychiatric/Behavioral: Positive for confusion.     Physical Exam Updated Vital Signs BP 115/85 (BP Location: Left Wrist)   Pulse 100   Temp 98.7 F (37.1 C) (Oral)   Resp 16   Ht 5\' 4"  (1.626 m)   Wt 64.4 kg (142 lb)   LMP 11/23/2013 Comment: last period was very light, spotting the week before  SpO2 96%   BMI 24.37 kg/m   Physical Exam  Constitutional: She is oriented to person, place, and time. She appears well-developed.  Patient appears pale and uncomfortable. Appears thin and chronically ill  HENT:  Head: Normocephalic and atraumatic.  Mouth/Throat:  Uvula is midline and oropharynx is clear and moist. Mucous membranes are dry.  Eyes: Pupils are equal, round, and reactive to light. Conjunctivae and EOM are normal.  Neck: Normal range of motion. Neck supple.  Cardiovascular: Regular rhythm and intact distal pulses.  Tachycardia present.   Pulmonary/Chest: Effort normal and breath sounds normal. No respiratory distress. She has no wheezes. She exhibits no tenderness.  Abdominal: Soft. Bowel sounds are normal. She exhibits no distension (no obvious distention) and no mass. There is generalized tenderness. There is guarding (voluntary guarding). There is no rigidity, no rebound, no CVA tenderness, no tenderness at McBurney's point and negative Murphy's sign.  Genitourinary: Rectal exam shows external hemorrhoid and tenderness. Rectal exam shows guaiac negative stool.  Musculoskeletal: Normal range of motion.  Neurological: She is alert and oriented to person, place, and time.  Skin: Skin is warm and dry. There is pallor.  Psychiatric: She has a normal mood and affect.  Nursing note and vitals reviewed.    ED  Treatments / Results  Labs (all labs ordered are listed, but only abnormal results are displayed) Labs Reviewed  CBC WITH DIFFERENTIAL/PLATELET - Abnormal; Notable for the following:       Result Value   RBC 3.51 (*)    Hemoglobin 10.3 (*)    HCT 32.2 (*)    Platelets 446 (*)    All other components within normal limits  COMPREHENSIVE METABOLIC PANEL - Abnormal; Notable for the following:    Chloride 97 (*)    Calcium 8.5 (*)    Albumin 2.2 (*)    All other components within normal limits  URINALYSIS, ROUTINE W REFLEX MICROSCOPIC - Abnormal; Notable for the following:    Specific Gravity, Urine >1.046 (*)    All other components within normal limits  CULTURE, BLOOD (ROUTINE X 2)  CULTURE, BLOOD (ROUTINE X 2)  URINE CULTURE  AMMONIA  HIV ANTIBODY (ROUTINE TESTING)  CBC  BASIC METABOLIC PANEL  POC OCCULT BLOOD, ED  I-STAT CG4 LACTIC ACID, ED  TYPE AND SCREEN    EKG  EKG Interpretation None       Radiology Ct Abdomen Pelvis W Contrast  Result Date: 10/02/2016 CLINICAL DATA:  51 year old female with history of metastatic breast cancer. Constipation. No bowel movement in the past 3 days. Nausea. EXAM: CT ABDOMEN AND PELVIS WITH CONTRAST TECHNIQUE: Multidetector CT imaging of the abdomen and pelvis was performed using the standard protocol following bolus administration of intravenous contrast. CONTRAST:  179mL ISOVUE-300 IOPAMIDOL (ISOVUE-300) INJECTION 61% COMPARISON:  CT of the abdomen and pelvis 09/09/2016. FINDINGS: Lower chest: Status post right mastectomy with right-sided breast implant incidentally noted. Tip of central venous catheter in the right atrium. Trace right pleural effusion with some subsegmental atelectasis or scarring in the right lower lobe. Hepatobiliary: A few tiny subcentimeter low-attenuation lesions are noted in the right lobe of the liver, too small to characterize. No larger more suspicious appearing hepatic lesions are noted. No intra or extrahepatic  biliary ductal dilatation. Gallbladder is moderately distended, but otherwise unremarkable in appearance. Pancreas: No pancreatic mass. No pancreatic ductal dilatation. No pancreatic or peripancreatic fluid or inflammatory changes. Spleen: Unremarkable. Adrenals/Urinary Tract: 4 mm nonobstructive calculus in the upper pole collecting system of the right kidney. Left kidney is normal in appearance. No hydroureteronephrosis. Urinary bladder is moderately distended, but otherwise unremarkable in appearance. Large bilateral adrenal lesions are again noted, most evident on the right side where there is a 2.8 x 5.4 x 6.3 cm right  adrenal mass (axial image 18 of series 2 and coronal image 73 of series 7) which appears to potentially directly invade the adjacent inferior vena cava. Stomach/Bowel: The appearance of the stomach is normal. No pathologic dilatation of small bowel or colon. Moderate volume of well-formed stool throughout the distal colon and rectum, compatible with the reported clinical history of constipation. Appendix is not confidently identified. There is again an unusual collection of gas anterior to the liver best appreciated on axial image 3 of series 2 and coronal image 27 of series 7, which appears to arise from a loop of small bowel, very similar to prior study 09/09/2016. Vascular/Lymphatic: Aortic atherosclerosis, without evidence of aneurysm or dissection in the abdominal or pelvic vasculature. Multiple borderline enlarged retroperitoneal lymph nodes measuring up to 7 mm in short axis are noted. Reproductive: Uterus and left ovary are unremarkable in appearance. Enhancing soft tissue mass is intimately associated with the right adnexa, as detailed below, similar to the prior study. Other: Large heterogeneously enhancing soft tissue masses in the right lower quadrant intimately associated with the left adnexa. The largest of these is medial measuring up to 5.6 x 5.8 cm, while the smaller lateral  lesion measures 5.2 x 4.6 cm (axial images 57 and 55 of series 2 respectively). Large volume of ascites with extensive thickening and enhancement of the peritoneal membranes, with the largest collection of this fluid in the anterior aspect of the abdomen adjacent to the previously described enhancing soft tissue lesions. Overall, the volume of ascites has decreased compared to the prior study, but now appears more focally loculated. Previously noted peritoneal drainage catheter has been removed. Other than the collection of gas anterior to the liver which could conceivably represent some loculated pneumoperitoneum, there is no gross pneumoperitoneum elsewhere. Musculoskeletal: Again noted are several mixed lytic and sclerotic lesions throughout the visualized axial and appendicular skeleton, with the largest of these again in the T8 vertebral body where there is a pathologic compression fracture and approximately 20% loss of anterior vertebral body height, similar to the prior study. IMPRESSION: 1. Overall, although the volume of malignant ascites has decreased compared to the prior study, and now appears focally loculated most evident in the anterior aspect of the abdomen/pelvis, surrounded by thickened and enhancing peritoneal membranes. This could simply be secondary to malignancy, however, the possibility of infection of this fluid following drainage with developing abscess is not entirely excluded. 2. Persistent gas collection anterior to the liver, very similar in appearance to the prior study with apparent communication with small bowel. This is unusual in appearance, and the possibility of focally contained perforated loop of distal small bowel remains of concern. However, a large diverticulum of the small bowel could also be considered, particularly in light of the stability of this finding. 3. Metastatic disease in the peritoneal cavity (intimately associated with the right adnexa) with malignant ascites,  metastatic disease to the adrenal glands bilaterally (with potential invasion of the inferior vena cava by the large right adrenal mass) and widespread metastatic disease to the bones again noted. Tiny subcentimeter low-attenuation lesions in the right lobe of the liver are too small to definitively characterize, but could represent additional sites of metastatic disease. Attention on followup studies is recommended. 4. Aortic atherosclerosis. Electronically Signed   By: Vinnie Langton M.D.   On: 10/02/2016 18:17   Dg Abd Acute W/chest  Result Date: 10/02/2016 CLINICAL DATA:  Abdominal pain.  Vomiting. EXAM: DG ABDOMEN ACUTE W/ 1V CHEST COMPARISON:  None.  FINDINGS: There is no evidence of dilated bowel loops or free intraperitoneal air. No radiopaque calculi or other significant radiographic abnormality is seen. Heart size and mediastinal contours are within normal limits. Both lungs are clear. IMPRESSION: No evidence of bowel obstruction or ileus. No acute cardiopulmonary disease. Electronically Signed   By: Marijo Conception, M.D.   On: 10/02/2016 16:11    Procedures Procedures (including critical care time)  Medications Ordered in ED Medications  iopamidol (ISOVUE-300) 61 % injection (not administered)  docusate sodium (COLACE) capsule 200 mg (200 mg Oral Given 10/02/16 2209)  hydrocortisone (ANUSOL-HC) 2.5 % rectal cream 1 application (not administered)  fentaNYL (DURAGESIC - dosed mcg/hr) 100 mcg (100 mcg Transdermal Patch Applied 10/02/16 2236)  fentaNYL (DURAGESIC - dosed mcg/hr) patch 75 mcg (75 mcg Transdermal Patch Applied 10/02/16 2237)  LORazepam (ATIVAN) tablet 0.5 mg (not administered)  diphenhydrAMINE (BENADRYL) capsule 25 mg (not administered)  pantoprazole (PROTONIX) EC tablet 40 mg (40 mg Oral Given 10/02/16 2209)  polyethylene glycol (MIRALAX / GLYCOLAX) packet 17 g (not administered)  calcium-vitamin D (OSCAL WITH D) 500-200 MG-UNIT per tablet 2 tablet (not administered)  buPROPion  (WELLBUTRIN XL) 24 hr tablet 150 mg (not administered)  enoxaparin (LOVENOX) injection 40 mg (40 mg Subcutaneous Given 10/02/16 2209)  acetaminophen (TYLENOL) tablet 650 mg (not administered)    Or  acetaminophen (TYLENOL) suppository 650 mg (not administered)  HYDROmorphone (DILAUDID) injection 1-2 mg (2 mg Intravenous Given 10/02/16 2057)  bisacodyl (DULCOLAX) EC tablet 5 mg (not administered)  sodium phosphate (FLEET) 7-19 GM/118ML enema 1 enema (not administered)  feeding supplement (ENSURE ENLIVE) (ENSURE ENLIVE) liquid 237 mL (not administered)  ondansetron (ZOFRAN) injection 4 mg (4 mg Intravenous Given 10/02/16 1502)  morphine 4 MG/ML injection 4 mg (4 mg Intravenous Given 10/02/16 1502)  sodium chloride 0.9 % bolus 1,000 mL (0 mLs Intravenous Stopped 10/02/16 1613)  HYDROmorphone (DILAUDID) injection 1 mg (1 mg Intravenous Given 10/02/16 1610)  iopamidol (ISOVUE-300) 61 % injection 100 mL (100 mLs Intravenous Contrast Given 10/02/16 1709)  HYDROmorphone (DILAUDID) injection 1 mg (1 mg Intravenous Given 10/02/16 1850)     Initial Impression / Assessment and Plan / ED Course  I have reviewed the triage vital signs and the nursing notes.  Pertinent labs & imaging results that were available during my care of the patient were reviewed by me and considered in my medical decision making (see chart for details).     Patient presenting with worsening abdominal pain and constipation. Physical exam shows patient is very tender to abdomen. Pale on exam. Patient slightly tachycardic. Warm to touch, but afebrile. Considering recent admission and patient history, will order CBC. CMP, ammonia, lactic, blood cultures, and UA. CT abd and dg acute abd ordered. Will give IVF, zofran and morphine.  Case discussed with attending, and Dr. Darl Householder evaluated the pt.   Lactate and ammonia negative.Hemoccult negative. CBC shows mild anemia, improved from several days ago. CMP reassuring. abd dg shows no signs of free air and  moderate stool burden. CT abdomen shows concern for early development of an abscess and persistence of a gas collection around the liver. On reassessment, pt states her pain was slightly improved, but it is returning. Will give 1 dose dilaudid.   Discussed case with hospitalist. Pt to be admitted.   Final Clinical Impressions(s) / ED Diagnoses   Final diagnoses:  Generalized abdominal pain  Constipation, unspecified constipation type    New Prescriptions New Prescriptions   No medications on file  Franchot Heidelberg, PA-C 10/02/16 2331    Drenda Freeze, MD 10/03/16 [phone]

## 2016-10-02 NOTE — ED Notes (Signed)
Second istat lactic cancelled d/t first result being WNL

## 2016-10-02 NOTE — ED Triage Notes (Addendum)
Pt from home via EMS c/o constipation. Pt has metastatic breast Ca. Pt had recent admit to [facility]. Pt reports to EMS that she has not had a BM in 3 days d/t pain meds. Pt reports that wants palliative care consult while she is here. Pt was nauseous and received 4 ODT zofran en route. Pt is A&O and in NAD

## 2016-10-02 NOTE — Progress Notes (Signed)
Pt was lying in bed when I arrived; she was alert and her mother was bedside. Her mother had a copy of an Advance Directive and wanted it notarized. She also had a will and other documents. I explained to her we would need an original AD and we cannot notarize other/outside documents, such as a will. I administered an original AD which included pt's living will and HPOA. I read and explained the document and pt said she understood and wrote her name and initialed according to her preferences.  After securing witnesses and notary, the document was completed. A copy was put in pt's file and original and copies were given directly to pt. Please pg if additional assistance is needed. [place], MDiv   10/02/16 1500  Clinical Encounter Type  Visited With Patient and family together

## 2016-10-03 DIAGNOSIS — G893 Neoplasm related pain (acute) (chronic): Secondary | ICD-10-CM | POA: Diagnosis not present

## 2016-10-03 DIAGNOSIS — F419 Anxiety disorder, unspecified: Secondary | ICD-10-CM | POA: Diagnosis present

## 2016-10-03 DIAGNOSIS — Z853 Personal history of malignant neoplasm of breast: Secondary | ICD-10-CM | POA: Diagnosis not present

## 2016-10-03 DIAGNOSIS — K5903 Drug induced constipation: Secondary | ICD-10-CM | POA: Diagnosis not present

## 2016-10-03 DIAGNOSIS — Z87891 Personal history of nicotine dependence: Secondary | ICD-10-CM | POA: Diagnosis not present

## 2016-10-03 DIAGNOSIS — R600 Localized edema: Secondary | ICD-10-CM | POA: Diagnosis present

## 2016-10-03 DIAGNOSIS — Z66 Do not resuscitate: Secondary | ICD-10-CM | POA: Diagnosis present

## 2016-10-03 DIAGNOSIS — Z7189 Other specified counseling: Secondary | ICD-10-CM

## 2016-10-03 DIAGNOSIS — E43 Unspecified severe protein-calorie malnutrition: Secondary | ICD-10-CM | POA: Diagnosis present

## 2016-10-03 DIAGNOSIS — Z9221 Personal history of antineoplastic chemotherapy: Secondary | ICD-10-CM | POA: Diagnosis not present

## 2016-10-03 DIAGNOSIS — E46 Unspecified protein-calorie malnutrition: Secondary | ICD-10-CM

## 2016-10-03 DIAGNOSIS — D61818 Other pancytopenia: Secondary | ICD-10-CM | POA: Diagnosis present

## 2016-10-03 DIAGNOSIS — Z79899 Other long term (current) drug therapy: Secondary | ICD-10-CM | POA: Diagnosis not present

## 2016-10-03 DIAGNOSIS — R109 Unspecified abdominal pain: Secondary | ICD-10-CM | POA: Diagnosis not present

## 2016-10-03 DIAGNOSIS — F329 Major depressive disorder, single episode, unspecified: Secondary | ICD-10-CM | POA: Diagnosis present

## 2016-10-03 DIAGNOSIS — Z515 Encounter for palliative care: Secondary | ICD-10-CM

## 2016-10-03 DIAGNOSIS — Z6822 Body mass index (BMI) 22.0-22.9, adult: Secondary | ICD-10-CM | POA: Diagnosis not present

## 2016-10-03 DIAGNOSIS — K921 Melena: Secondary | ICD-10-CM | POA: Diagnosis present

## 2016-10-03 DIAGNOSIS — K5900 Constipation, unspecified: Secondary | ICD-10-CM | POA: Diagnosis not present

## 2016-10-03 DIAGNOSIS — Z818 Family history of other mental and behavioral disorders: Secondary | ICD-10-CM | POA: Diagnosis not present

## 2016-10-03 DIAGNOSIS — E279 Disorder of adrenal gland, unspecified: Secondary | ICD-10-CM | POA: Diagnosis present

## 2016-10-03 DIAGNOSIS — C786 Secondary malignant neoplasm of retroperitoneum and peritoneum: Secondary | ICD-10-CM | POA: Diagnosis present

## 2016-10-03 DIAGNOSIS — D638 Anemia in other chronic diseases classified elsewhere: Secondary | ICD-10-CM | POA: Diagnosis present

## 2016-10-03 DIAGNOSIS — R180 Malignant ascites: Secondary | ICD-10-CM | POA: Diagnosis present

## 2016-10-03 DIAGNOSIS — Z923 Personal history of irradiation: Secondary | ICD-10-CM | POA: Diagnosis not present

## 2016-10-03 DIAGNOSIS — Z90722 Acquired absence of ovaries, bilateral: Secondary | ICD-10-CM | POA: Diagnosis not present

## 2016-10-03 DIAGNOSIS — C7951 Secondary malignant neoplasm of bone: Secondary | ICD-10-CM | POA: Diagnosis present

## 2016-10-03 DIAGNOSIS — R188 Other ascites: Secondary | ICD-10-CM

## 2016-10-03 DIAGNOSIS — R1030 Lower abdominal pain, unspecified: Secondary | ICD-10-CM | POA: Diagnosis present

## 2016-10-03 DIAGNOSIS — C50919 Malignant neoplasm of unspecified site of unspecified female breast: Secondary | ICD-10-CM | POA: Diagnosis not present

## 2016-10-03 DIAGNOSIS — Z9079 Acquired absence of other genital organ(s): Secondary | ICD-10-CM | POA: Diagnosis not present

## 2016-10-03 LAB — CBC
HEMATOCRIT: 30 % — AB (ref 36.0–46.0)
Hemoglobin: 9.4 g/dL — ABNORMAL LOW (ref 12.0–15.0)
MCH: 29.1 pg (ref 26.0–34.0)
MCHC: 31.3 g/dL (ref 30.0–36.0)
MCV: 92.9 fL (ref 78.0–100.0)
PLATELETS: 399 10*3/uL (ref 150–400)
RBC: 3.23 MIL/uL — AB (ref 3.87–5.11)
RDW: 15 % (ref 11.5–15.5)
WBC: 4.9 10*3/uL (ref 4.0–10.5)

## 2016-10-03 LAB — BASIC METABOLIC PANEL
ANION GAP: 7 (ref 5–15)
BUN: 6 mg/dL (ref 6–20)
CALCIUM: 7.8 mg/dL — AB (ref 8.9–10.3)
CO2: 30 mmol/L (ref 22–32)
Chloride: 102 mmol/L (ref 101–111)
Creatinine, Ser: 0.41 mg/dL — ABNORMAL LOW (ref 0.44–1.00)
GLUCOSE: 89 mg/dL (ref 65–99)
POTASSIUM: 3.5 mmol/L (ref 3.5–5.1)
Sodium: 139 mmol/L (ref 135–145)

## 2016-10-03 LAB — HIV ANTIBODY (ROUTINE TESTING W REFLEX): HIV SCREEN 4TH GENERATION: NONREACTIVE

## 2016-10-03 MED ORDER — SENNA 8.6 MG PO TABS
2.0000 | ORAL_TABLET | Freq: Every day | ORAL | Status: DC
  Administered 2016-10-03: 17.2 mg via ORAL
  Filled 2016-10-03: qty 2

## 2016-10-03 MED ORDER — POLYETHYLENE GLYCOL 3350 17 G PO PACK
17.0000 g | PACK | Freq: Two times a day (BID) | ORAL | Status: DC
  Administered 2016-10-03 (×2): 17 g via ORAL
  Filled 2016-10-03 (×2): qty 1

## 2016-10-03 NOTE — Progress Notes (Signed)
IR asked to eval/review imaging for possible paracentesis/drainage of abdominal fluid collection. Hx and prior imaging reviewed. Pt with known metastatic breast cancer and intra-abdominal mets with malignant ascites. Had peritoneal PleurX placed, however, this eventually stopped working as her fluid become more loculated. Catheter was removed 8/20 Subsequent US of the abdomen shows residual fluid is densely loculated and not amenable to para/drainage. Review of today's CT shows the sasme low anterior fluid collection to be densely loculated. Attempted paracentesis would be futile. Consideration of large bore drainage tube would also be risky. Given her lack of infectious sxs, (afebrile, nl WBC, nl Lactate), do not feel this represents abscess(as suggested in CT report). Therefore placing a drain, which is unlikley to yield significant output, puts her at risk of introducing infection to that collection.  Discussed with Dr. Maryland Pink today.  Ascencion Dike PA-C Interventional Radiology 10/03/2016 10:30 AM

## 2016-10-03 NOTE — Consult Note (Signed)
Consultation Note Date: 10/03/2016   Patient Name: Tamara Byrd  DOB: 1965-10-18  MRN: [id number]  Age / Sex: 51 y.o., female  PCP: Olena Mater, MD Referring Physician: Bonnielee Haff, MD  Reason for Consultation: Non pain symptom management  HPI/Patient Profile: 51 y.o. female   admitted on 10/02/2016   Clinical Assessment and Goals of Care:  Tamara Byrd is a 51 y.o. female with medical history significant for breast cancer with intra-abdominal and osseous metastases, now presenting to the emergency department for evaluation of intolerable abdominal pain. Patient has unfortunately experienced a recurrence in her breast cancer with metastatic disease, complicated by severe pancytopenia leading to chemotherapy being held, and also complicated by severe pain, mainly in the lower abdomen. The patient reports constant, severe, spasmodic and sharp pain in the lower abdomen. This is exacerbated by any movement, and she suspects the current exacerbation is secondary to constipation. She uses fentanyl patch and oral Dilaudid at home, also using ice and heat. Patient was admitted to the hospital last month with SBP and has recently completed a course of Rocephin. She was transfused 2 units of packed red blood cells at the cancer Center 2 days prior to this admission.   Patient was seen by Dr Domingo Cocking, PMT service in August 2018. At that time, pain management recommendations were made, patient elected DNR code status and wishes to designate her mother as her HCPOA agent, she wished to continue with any and all reasonable cancer treatments.   I met with Ms Nouri in her room. She is resting in bed, she is awake alert, some flat affect is evident. Patient complains of constipation and ongoing abdominal pain at home.   We reviewed her bowel regimen and her other medications as well. We talked about the burden of uncontrolled  symptoms such as pain and constipation. Goals, wishes and values in the face of a serious illness also discussed in detail.   See recommendations below, thank you for the consult.   NEXT OF KIN  mother   SUMMARY OF RECOMMENDATIONS    1. Agree with current bowel regimen: Fleet enema, miralax and sennokot in current doses. Will D/C Colace. Will need to consider SQ Relistor if there are ongoing issues with stool retention, for opioid induced constipation. Patient might also benefit from PO Movantik on D/C.   2. Monitor current pain medications. Patient is on trans dermal fentanyl patch 175 mcg, also has IV Dilaudid PRN.   3. Goals of care: Patient voices a lot of existential suffering, she believes she has become a burden on her mother, she states, "sometimes I wonder if it's worth it to keep fighting." offered active listening and supportive care, we will follow along.   Thank you for the consult.   Code Status/Advance Care Planning:  DNR    Symptom Management:    as above   Palliative Prophylaxis:   Bowel Regimen  Psycho-social/Spiritual:   Desire for further Chaplaincy support:yes  Additional Recommendations: Caregiving  Support/Resources  Prognosis:   Unable to determine  Discharge Planning: To Be Determined      Primary Diagnoses: Present on Admission: . Metastatic breast cancer ([place]) . Intractable abdominal pain . Malignant ascites . Anemia . Thrombocytosis ([place]) . Intraabdominal fluid collection . Depression . Anxiety . Protein calorie malnutrition ([place]) . Abdominal pain   I have reviewed the medical record, interviewed the patient and family, and examined the patient. The following aspects are pertinent.  Past Medical History:  Diagnosis Date  . Anemia 10/02/2016  . Breast cancer ([place])   . Depression   . Medical history non-contributory    Social History   Social History  . Marital status: Legally Separated    Spouse name: N/A  . Number of  children: N/A  . Years of education: N/A   Social History Main Topics  . Smoking status: Former Research scientist (life sciences)  . Smokeless tobacco: Never Used  . Alcohol use No  . Drug use: No  . Sexual activity: Yes    Partners: Male    Birth control/ protection: Other-see comments     Comment: BTL   Other Topics Concern  . None   Social History Narrative   ** Merged History Encounter **       Family History  Problem Relation Age of Onset  . Depression Mother    Scheduled Meds: . buPROPion  150 mg Oral Daily  . calcium-vitamin D  2 tablet Oral Daily  . docusate sodium  200 mg Oral BID  . enoxaparin (LOVENOX) injection  40 mg Subcutaneous Q24H  . feeding supplement (ENSURE ENLIVE)  237 mL Oral BID BM  . fentaNYL  100 mcg Transdermal Q72H  . fentaNYL  75 mcg Transdermal Q72H  . pantoprazole  40 mg Oral QHS  . polyethylene glycol  17 g Oral BID  . senna  2 tablet Oral Daily   Continuous Infusions: PRN Meds:.acetaminophen **OR** acetaminophen, bisacodyl, diphenhydrAMINE, hydrocortisone, HYDROmorphone (DILAUDID) injection, LORazepam, sodium phosphate Medications Prior to Admission:  Prior to Admission medications   Medication Sig Start Date End Date Taking? Authorizing Provider  buPROPion (WELLBUTRIN XL) 150 MG 24 hr tablet Take 150 mg by mouth daily.   Yes [provider]  Calcium Carbonate-Vit D-Min (CALCIUM 1200) 1200-1000 MG-UNIT CHEW Chew 1 tablet by mouth daily. 08/27/16  Yes Nicholas Lose, MD  diphenhydrAMINE (BENADRYL) 25 mg capsule Take 1 capsule (25 mg total) by mouth at bedtime as needed for sleep. 09/16/16  Yes Domenic Polite, MD  docusate sodium (COLACE) 100 MG capsule Take 200 mg by mouth 2 (two) times daily.   Yes [provider]  fentaNYL (DURAGESIC - DOSED MCG/HR) 100 MCG/HR Place 1 patch (100 mcg total) onto the skin every 3 (three) days. 09/29/16  Yes Nicholas Lose, MD  fentaNYL (DURAGESIC - DOSED MCG/HR) 75 MCG/HR Place 1 patch (75 mcg total) onto the skin every 3  (three) days. 09/29/16  Yes Nicholas Lose, MD  furosemide (LASIX) 40 MG tablet Take 1 tablet (40 mg total) by mouth 2 (two) times daily. 09/16/16  Yes Domenic Polite, MD  HYDROmorphone (DILAUDID) 4 MG tablet Take 1-2 tablets (4-8 mg total) by mouth every 3 (three) hours as needed for moderate pain or severe pain. 09/29/16  Yes Nicholas Lose, MD  LORazepam (ATIVAN) 0.5 MG tablet Take 1 tablet (0.5 mg total) by mouth 2 (two) times daily as needed (anxiety or sleep). 09/29/16  Yes Nicholas Lose, MD  ondansetron (ZOFRAN) 8 MG tablet Take 1 tablet (8 mg total) by mouth every 8 (eight) hours as needed  for nausea or vomiting. 09/16/16  Yes Domenic Polite, MD  pantoprazole (PROTONIX) 40 MG tablet Take 1 tablet (40 mg total) by mouth at bedtime. 09/16/16  Yes Domenic Polite, MD  polyethylene glycol [facility] / GLYCOLAX) packet Take 17 g by mouth daily. 09/15/16  Yes Lavina Hamman, MD  potassium chloride SA (K-DUR,KLOR-CON) 20 MEQ tablet Take 2 tablets (40 mEq total) by mouth daily. Patient taking differently: Take 20 mEq by mouth 2 (two) times daily.  09/16/16  Yes Domenic Polite, MD  PROCTOZONE-HC 2.5 % rectal cream Apply 1 application topically daily as needed. 09/14/16  Yes [provider]  Methylnaltrexone Bromide (RELISTOR) 150 MG TABS Take 150 mg by mouth daily. Patient not taking: Reported on 10/02/2016 09/16/16   Domenic Polite, MD  palbociclib [facility]) 100 MG capsule Take 1 capsule (100 mg total) by mouth daily with breakfast. Take whole with food. Take for 3 weeks on, 1 week off. Patient not taking: Reported on 10/02/2016 09/20/16   Nicholas Lose, MD   No Known Allergies Review of Systems +constipation +abdominal pain  Physical Exam Weak appearing chronically ill appearing lady resting in bed Has PICC line Regular breath sounds S1 S2 Abdomen is not distended, mild generalized tenderness No edema Awake alert Non focal Flat affect  Vital Signs: BP 104/70 (BP Location: Left Wrist)   Pulse  85   Temp 98.1 F (36.7 C) (Oral)   Resp 20   Ht _0  (1.626 m)   Wt 64.4 kg (142 lb)   LMP 11/23/2013 Comment: last period was very light, spotting the week before  SpO2 95%   BMI 24.37 kg/m  Pain Assessment: 0-10   Pain Score: Asleep   SpO2: SpO2: 95 % O2 Device:SpO2: 95 % O2 Flow Rate: .   IO: Intake/output summary:  Intake/Output Summary (Last 24 hours) at 10/03/16 1137 Last data filed at 10/03/16 1115  Gross per 24 hour  Intake              120 ml  Output              100 ml  Net               20 ml    LBM: Last BM Date: 10/02/16 Baseline Weight: Weight: 64.4 kg (142 lb) Most recent weight: Weight: 64.4 kg (142 lb)     Palliative Assessment/Data:   Flowsheet Rows     Most Recent Value  Intake Tab  Referral Department  Hospitalist  Unit at Time of Referral  Oncology Unit  Palliative Care Primary Diagnosis  Cancer  Palliative Care Type  Return patient Palliative Care  Reason for referral  Non-pain Symptom, Clarify Goals of Care  Date first seen by Palliative Care  10/03/16  Clinical Assessment  Palliative Performance Scale Score  30%  Pain Max last 24 hours  7  Pain Min Last 24 hours  5  Dyspnea Max Last 24 Hours  3  Dyspnea Min Last 24 hours  2  Nausea Max Last 24 Hours  3  Nausea Min Last 24 Hours  2  Anxiety Max Last 24 Hours  5  Anxiety Min Last 24 Hours  4  Psychosocial & Spiritual Assessment  Palliative Care Outcomes  Patient/Family meeting held?  Yes  Who was at the meeting?  patient who is decisional.  Palliative Care Outcomes  Improved non-pain symptom therapy      Time In:  10 Time Out:  11.10 Time Total:  70  min  Greater than 50%  of this time was spent counseling and coordinating care related to the above assessment and plan.  Signed by: Loistine Chance, MD  [phone]  Please contact Palliative Medicine Team phone at [phone] for questions and concerns.  For individual provider: See Shea Evans

## 2016-10-03 NOTE — Progress Notes (Signed)
[facility] PROGRESS NOTE  Tamara Byrd WRU:[id number] DOB: 16-Jan-1966 DOA: 10/02/2016  PCP: Olena Mater, MD  Brief History/Interval Summary: 51 year old Caucasian female with a past medical history of metastatic breast cancer with the metastases to bone, as well as in the intra-abdominal area. She previously has had significant ascites and required catheter placement, which stopped working as the fluid became more loculated. Presented with complaints of abdominal pain and constipation. She is requiring high does not collect for pain control. CT scan revealed a complex appearing fluid collection in the abdomen. She was hospitalized for further management.  Reason for Visit: Abdominal pain related to cancer. Constipation. Intra-abdominal fluid collection.  Consultants: Interventional radiology. Oncology. Palliative medicine  Procedures: None yet  Antibiotics: None  Subjective/Interval History: Patient very frustrated by her constipation. She did have a bowel movement yesterday evening, which was after 4 days of being constipated. She is also experiencing a lot of abdominal pain. Some nausea but no vomiting.  ROS: No chest pain or shortness of breath  Objective:  Vital Signs  Vitals:   10/02/16 1753 10/02/16 1956 10/02/16 2022 10/03/16 0601  BP: 118/88 111/73 123/75 104/70  Pulse: 96 92 91 85  Resp: 18 18 20 20   Temp:   98.3 F (36.8 C) 98.1 F (36.7 C)  TempSrc:  Oral Oral Oral  SpO2: 96% 96% 97% 95%  Weight:   64.4 kg (142 lb)   Height:   5\' 4"  (1.626 m)     Intake/Output Summary (Last 24 hours) at 10/03/16 1123 Last data filed at 10/03/16 1115  Gross per 24 hour  Intake              120 ml  Output              100 ml  Net               20 ml   Filed Weights   10/02/16 1322 10/02/16 2022  Weight: 64.4 kg (142 lb) 64.4 kg (142 lb)    General appearance: alert, cooperative, appears stated age and Noted to be uncomfortable due to pain Head:  Normocephalic, without obvious abnormality, atraumatic Resp: clear to auscultation bilaterally Cardio: regular rate and rhythm, S1, S2 normal, no murmur, click, rub or gallop GI: Abdomen is soft. Diffusely tender without any definite rebound rigidity or guarding. Bowel sounds present. No masses or organomegaly Extremities: extremities normal, atraumatic, no cyanosis or edema Neurologic: No focal neurological deficits noted  Lab Results:  Data Reviewed: I have personally reviewed following labs and imaging studies  CBC:  Recent Labs Lab 09/29/16 1030 10/02/16 1510 10/03/16 0500  WBC 4.6 7.1 4.9  NEUTROABS 2.7 4.9  --   HGB 7.2* 10.3* 9.4*  HCT 23.6* 32.2* 30.0*  MCV 95.9 91.7 92.9  PLT 374 446* 914    Basic Metabolic Panel:  Recent Labs Lab 09/29/16 1031 10/02/16 1510 10/03/16 0500  NA 134* 137 139  K 4.0 4.1 3.5  CL  --  97* 102  CO2 30* 30 30  GLUCOSE 90 94 89  BUN 6.1* 7 6  CREATININE 0.7 0.60 0.41*  CALCIUM 8.7 8.5* 7.8*    GFR: Estimated Creatinine Clearance: 71 mL/min (A) (by C-G formula based on SCr of 0.41 mg/dL (L)).  Liver Function Tests:  Recent Labs Lab 09/29/16 1031 10/02/16 1510  AST 38* 31  ALT 19 22  ALKPHOS 107 95  BILITOT 0.27 0.4  PROT 6.7 7.1  ALBUMIN 1.8* 2.2*  Recent Labs Lab 10/02/16 1510  AMMONIA 15     Radiology Studies: Ct Abdomen Pelvis W Contrast  Result Date: 10/02/2016 CLINICAL DATA:  51 year old female with history of metastatic breast cancer. Constipation. No bowel movement in the past 3 days. Nausea. EXAM: CT ABDOMEN AND PELVIS WITH CONTRAST TECHNIQUE: Multidetector CT imaging of the abdomen and pelvis was performed using the standard protocol following bolus administration of intravenous contrast. CONTRAST:  18mL ISOVUE-300 IOPAMIDOL (ISOVUE-300) INJECTION 61% COMPARISON:  CT of the abdomen and pelvis 09/09/2016. FINDINGS: Lower chest: Status post right mastectomy with right-sided breast implant incidentally  noted. Tip of central venous catheter in the right atrium. Trace right pleural effusion with some subsegmental atelectasis or scarring in the right lower lobe. Hepatobiliary: A few tiny subcentimeter low-attenuation lesions are noted in the right lobe of the liver, too small to characterize. No larger more suspicious appearing hepatic lesions are noted. No intra or extrahepatic biliary ductal dilatation. Gallbladder is moderately distended, but otherwise unremarkable in appearance. Pancreas: No pancreatic mass. No pancreatic ductal dilatation. No pancreatic or peripancreatic fluid or inflammatory changes. Spleen: Unremarkable. Adrenals/Urinary Tract: 4 mm nonobstructive calculus in the upper pole collecting system of the right kidney. Left kidney is normal in appearance. No hydroureteronephrosis. Urinary bladder is moderately distended, but otherwise unremarkable in appearance. Large bilateral adrenal lesions are again noted, most evident on the right side where there is a 2.8 x 5.4 x 6.3 cm right adrenal mass (axial image 18 of series 2 and coronal image 73 of series 7) which appears to potentially directly invade the adjacent inferior vena cava. Stomach/Bowel: The appearance of the stomach is normal. No pathologic dilatation of small bowel or colon. Moderate volume of well-formed stool throughout the distal colon and rectum, compatible with the reported clinical history of constipation. Appendix is not confidently identified. There is again an unusual collection of gas anterior to the liver best appreciated on axial image 3 of series 2 and coronal image 27 of series 7, which appears to arise from a loop of small bowel, very similar to prior study 09/09/2016. Vascular/Lymphatic: Aortic atherosclerosis, without evidence of aneurysm or dissection in the abdominal or pelvic vasculature. Multiple borderline enlarged retroperitoneal lymph nodes measuring up to 7 mm in short axis are noted. Reproductive: Uterus and left  ovary are unremarkable in appearance. Enhancing soft tissue mass is intimately associated with the right adnexa, as detailed below, similar to the prior study. Other: Large heterogeneously enhancing soft tissue masses in the right lower quadrant intimately associated with the left adnexa. The largest of these is medial measuring up to 5.6 x 5.8 cm, while the smaller lateral lesion measures 5.2 x 4.6 cm (axial images 57 and 55 of series 2 respectively). Large volume of ascites with extensive thickening and enhancement of the peritoneal membranes, with the largest collection of this fluid in the anterior aspect of the abdomen adjacent to the previously described enhancing soft tissue lesions. Overall, the volume of ascites has decreased compared to the prior study, but now appears more focally loculated. Previously noted peritoneal drainage catheter has been removed. Other than the collection of gas anterior to the liver which could conceivably represent some loculated pneumoperitoneum, there is no gross pneumoperitoneum elsewhere. Musculoskeletal: Again noted are several mixed lytic and sclerotic lesions throughout the visualized axial and appendicular skeleton, with the largest of these again in the T8 vertebral body where there is a pathologic compression fracture and approximately 20% loss of anterior vertebral body height, similar to the  prior study. IMPRESSION: 1. Overall, although the volume of malignant ascites has decreased compared to the prior study, and now appears focally loculated most evident in the anterior aspect of the abdomen/pelvis, surrounded by thickened and enhancing peritoneal membranes. This could simply be secondary to malignancy, however, the possibility of infection of this fluid following drainage with developing abscess is not entirely excluded. 2. Persistent gas collection anterior to the liver, very similar in appearance to the prior study with apparent communication with small bowel.  This is unusual in appearance, and the possibility of focally contained perforated loop of distal small bowel remains of concern. However, a large diverticulum of the small bowel could also be considered, particularly in light of the stability of this finding. 3. 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 Tiny subcentimeter low-attenuation lesions in the right lobe of the liver are too small to definitively characterize, but could represent additional sites of metastatic disease. Attention on followup studies is recommended. 4. Aortic atherosclerosis. Electronically Signed   By: Vinnie Langton M.D.   On: 10/02/2016 18:17   Dg Abd Acute W/chest  Result Date: 10/02/2016 CLINICAL DATA:  Abdominal pain.  Vomiting. EXAM: DG ABDOMEN ACUTE W/ 1V CHEST COMPARISON:  None. FINDINGS: There is no evidence of dilated bowel loops or free intraperitoneal air. No radiopaque calculi or other significant radiographic abnormality is seen. Heart size and mediastinal contours are within normal limits. Both lungs are clear. IMPRESSION: No evidence of bowel obstruction or ileus. No acute cardiopulmonary disease. Electronically Signed   By: Marijo Conception, M.D.   On: 10/02/2016 16:11     Medications:  Scheduled: . buPROPion  150 mg Oral Daily  . calcium-vitamin D  2 tablet Oral Daily  . docusate sodium  200 mg Oral BID  . enoxaparin (LOVENOX) injection  40 mg Subcutaneous Q24H  . feeding supplement (ENSURE ENLIVE)  237 mL Oral BID BM  . fentaNYL  100 mcg Transdermal Q72H  . fentaNYL  75 mcg Transdermal Q72H  . pantoprazole  40 mg Oral QHS  . polyethylene glycol  17 g Oral BID  . senna  2 tablet Oral Daily   Continuous:  PIR:[id number] **OR**  acetaminophen, bisacodyl, diphenhydrAMINE, hydrocortisone, HYDROmorphone (DILAUDID) injection, LORazepam, sodium phosphate  Assessment/Plan:  Principal Problem:   Intractable abdominal pain Active Problems:   Depression   Metastatic breast cancer (HCC)   Anxiety   Malignant ascites   Anemia   Thrombocytosis ([facility])   Intraabdominal fluid collection   Protein calorie malnutrition ([facility])   Abdominal pain    Abdominal pain, most likely due to her metastatic cancer. CT scan findings noted. Discussed with interventional radiology today. Most of these findings are chronic. Any intervention would subject her to high risk of infection. Patient is also constipated which is also contributing to her symptoms. Increase the dose of laxatives. It looks like she was started on Relistor by her oncologist a few weeks ago for constipation. It does not appear that the patient has been taking this medication.  Metastatic breast cancer with cancer-related pain. Followed by oncology for recurrent breast cancer. Treatment has been complicated by severe pancytopenia and her Palbociclib has been held. Faslodex was given. She has been treated with radiation as well. Oncology made aware. Patient is also on  high dose fentanyl patch for pain control. She might benefit from being seen by palliative medicine. Consultation has been requested.  Malignant ascites with loculated collection. She has had a peritoneal catheter previously which had to be removed at as it was not draining the collection. This collection has been seen before. IR was consulted. However, they do not feel that this is amenable to drainage. This is an unfortunate situation. We will need input from patient's oncologist and palliative medicine going forward.  Normocytic anemia/anemia of chronic disease. Patient recently transfused PRBCs on 9/6. Continue to monitor hemoglobin. No obvious overt bleeding.  Depression and anxiety. Continue home  medications.  Severe protein calorie malnutrition. Continued artery supplements.  Edema in the lower extremities. Most likely due to hypoalbuminemia and third spacing. Continue to monitor for now. 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  DVT Prophylaxis: Lovenox    Code Status: DO NOT RESUSCITATE  Family Communication: Discussed with the patient  Disposition Plan: Management as outlined above.    LOS: 0 days   [facility] Pager [phone] 10/03/2016, 11:23 AM  If 7PM-7AM, please contact night-coverage at [email or web address], password [facility]

## 2016-10-04 DIAGNOSIS — K5900 Constipation, unspecified: Secondary | ICD-10-CM

## 2016-10-04 DIAGNOSIS — R180 Malignant ascites: Secondary | ICD-10-CM

## 2016-10-04 DIAGNOSIS — C7951 Secondary malignant neoplasm of bone: Secondary | ICD-10-CM

## 2016-10-04 DIAGNOSIS — K5903 Drug induced constipation: Secondary | ICD-10-CM

## 2016-10-04 DIAGNOSIS — G893 Neoplasm related pain (acute) (chronic): Principal | ICD-10-CM

## 2016-10-04 DIAGNOSIS — C7900 Secondary malignant neoplasm of unspecified kidney and renal pelvis: Secondary | ICD-10-CM

## 2016-10-04 DIAGNOSIS — C50919 Malignant neoplasm of unspecified site of unspecified female breast: Secondary | ICD-10-CM

## 2016-10-04 DIAGNOSIS — R109 Unspecified abdominal pain: Secondary | ICD-10-CM

## 2016-10-04 LAB — CBC
HCT: 28.2 % — ABNORMAL LOW (ref 36.0–46.0)
HEMOGLOBIN: 8.9 g/dL — AB (ref 12.0–15.0)
MCH: 29.2 pg (ref 26.0–34.0)
MCHC: 31.6 g/dL (ref 30.0–36.0)
MCV: 92.5 fL (ref 78.0–100.0)
PLATELETS: 395 10*3/uL (ref 150–400)
RBC: 3.05 MIL/uL — ABNORMAL LOW (ref 3.87–5.11)
RDW: 14.7 % (ref 11.5–15.5)
WBC: 4.4 10*3/uL (ref 4.0–10.5)

## 2016-10-04 LAB — BASIC METABOLIC PANEL
Anion gap: 9 (ref 5–15)
CALCIUM: 8.4 mg/dL — AB (ref 8.9–10.3)
CHLORIDE: 100 mmol/L — AB (ref 101–111)
CO2: 30 mmol/L (ref 22–32)
CREATININE: 0.45 mg/dL (ref 0.44–1.00)
Glucose, Bld: 90 mg/dL (ref 65–99)
Potassium: 3.9 mmol/L (ref 3.5–5.1)
SODIUM: 139 mmol/L (ref 135–145)

## 2016-10-04 LAB — URINE CULTURE: CULTURE: NO GROWTH

## 2016-10-04 MED ORDER — POLYETHYLENE GLYCOL 3350 17 G PO PACK
17.0000 g | PACK | Freq: Three times a day (TID) | ORAL | Status: DC
  Administered 2016-10-04: 17 g via ORAL
  Filled 2016-10-04: qty 1

## 2016-10-04 MED ORDER — HEPARIN SOD (PORK) LOCK FLUSH 100 UNIT/ML IV SOLN
500.0000 [IU] | Freq: Once | INTRAVENOUS | Status: DC
  Filled 2016-10-04: qty 5

## 2016-10-04 MED ORDER — SENNA 8.6 MG PO TABS: 2.0000 | ORAL_TABLET | Freq: Two times a day (BID) | ORAL | 0 refills | Status: DC

## 2016-10-04 MED ORDER — ONDANSETRON HCL 4 MG/2ML IJ SOLN
4.0000 mg | Freq: Four times a day (QID) | INTRAMUSCULAR | Status: DC | PRN
  Filled 2016-10-04: qty 2

## 2016-10-04 MED ORDER — FUROSEMIDE 40 MG PO TABS: 40.0000 mg | ORAL_TABLET | Freq: Every day | ORAL | 0 refills | Status: DC

## 2016-10-04 MED ORDER — POLYETHYLENE GLYCOL 3350 17 G PO PACK: 17.0000 g | PACK | Freq: Three times a day (TID) | ORAL | 0 refills | Status: DC

## 2016-10-04 MED ORDER — SENNA 8.6 MG PO TABS
2.0000 | ORAL_TABLET | Freq: Two times a day (BID) | ORAL | Status: DC
  Administered 2016-10-04: 17.2 mg via ORAL
  Filled 2016-10-04: qty 2

## 2016-10-04 MED ORDER — NALOXEGOL OXALATE 12.5 MG PO TABS: 12.5000 mg | ORAL_TABLET | Freq: Every day | ORAL | 0 refills | Status: DC

## 2016-10-04 MED ORDER — NALOXEGOL OXALATE 12.5 MG PO TABS
12.5000 mg | ORAL_TABLET | Freq: Every day | ORAL | Status: DC
  Administered 2016-10-04: 12.5 mg via ORAL
  Filled 2016-10-04: qty 1

## 2016-10-04 NOTE — Progress Notes (Signed)
HEMATOLOGY-ONCOLOGY PROGRESS NOTE  SUBJECTIVE:Constipation relieved, Pain much better Was seen by Palliative care Was evaluated with CT, stable ascites. Pain improved with moving bowels  OBJECTIVE: REVIEW OF SYSTEMS:    Eyes: Denies blurriness of vision Ears, nose, mouth, throat, and face: Denies mucositis or sore throat Respiratory: Denies cough, dyspnea or wheezes Cardiovascular: Denies palpitation, chest discomfort Gastrointestinal: Abd pain Skin: Denies abnormal skin rashes Lymphatics: Denies new lymphadenopathy or easy bruising Neurological:Denies numbness, tingling or new weaknesses Behavioral/Psych: Mood is stable, no new changes  Extremities: No lower extremity edema All other systems were reviewed with the patient and are negative.  I have reviewed the past medical history, past surgical history, social history and family history with the patient and they are unchanged from previous note.   PHYSICAL EXAMINATION: ECOG PERFORMANCE STATUS: 1 - Symptomatic but completely ambulatory  Vitals:   10/04/16 0609 10/04/16 1414  BP: 93/64 92/71  Pulse: 81 88  Resp: 16 16  Temp: 97.9 F (36.6 C) 98.3 F (36.8 C)  SpO2: 96% 95%   Filed Weights   10/02/16 1322 10/02/16 2022 10/04/16 0609  Weight: 142 lb (64.4 kg) 142 lb (64.4 kg) 129 lb 3.2 oz (58.6 kg)    GENERAL:alert, no distress and comfortable SKIN: skin color, texture, turgor are normal, no rashes or significant lesions EYES: normal, Conjunctiva are pink and non-injected, sclera clear OROPHARYNX:no exudate, no erythema and lips, buccal mucosa, and tongue normal  NECK: supple, thyroid normal size, non-tender, without nodularity LYMPH:  no palpable lymphadenopathy in the cervical, axillary or inguinal LUNGS: clear to auscultation and percussion with normal breathing effort HEART: regular rate & rhythm and no murmurs and no lower extremity edema ABDOMEN:abdomen soft Musculoskeletal:no cyanosis of digits and no  clubbing  NEURO: alert & oriented x 3 with fluent speech, no focal motor/sensory deficits  LABORATORY DATA:  I have reviewed the data as listed CMP Latest Ref Rng & Units 10/04/2016 10/03/2016 10/02/2016  Glucose 65 - 99 mg/dL 90 89 94  BUN 6 - 20 mg/dL <5(L) 6 7  Creatinine 0.44 - 1.00 mg/dL 0.45 0.41(L) 0.60  Sodium 135 - 145 mmol/L 139 139 137  Potassium 3.5 - 5.1 mmol/L 3.9 3.5 4.1  Chloride 101 - 111 mmol/L 100(L) 102 97(L)  CO2 22 - 32 mmol/L 30 30 30   Calcium 8.9 - 10.3 mg/dL 8.4(L) 7.8(L) 8.5(L)  Total Protein 6.5 - 8.1 g/dL - - 7.1  Total Bilirubin 0.3 - 1.2 mg/dL - - 0.4  Alkaline Phos 38 - 126 U/L - - 95  AST 15 - 41 U/L - - 31  ALT 14 - 54 U/L - - 22    Lab Results  Component Value Date   WBC 4.4 10/04/2016   HGB 8.9 (L) 10/04/2016   HCT 28.2 (L) 10/04/2016   MCV 92.5 10/04/2016   PLT 395 10/04/2016   NEUTROABS 4.9 10/02/2016    ASSESSMENT AND PLAN: 1. Metastatic Breast Cancer 2. Anemia: Prev needed Blood 3. Start Ibrance at home 4. Keep the 2 week follow up with me for lab check

## 2016-10-04 NOTE — Progress Notes (Signed)
Daily Progress Note   Patient Name: Tamara Byrd       Date: 10/04/2016 DOB: 1965/08/01  Age: 51 y.o. MRN#: [id number] Attending Physician: Bonnielee Haff, MD Primary Care Physician: Olena Mater, MD Admit Date: 10/02/2016  Reason for Consultation/Follow-up: Non pain symptom management  Subjective:  awake alert resting in bed had a small breakfast,  She had a bowel movement when she was admitted, we reviewed her bowel regimen again, will start PO Movantik for opioid induced constipation Continue other bowel regimen   Length of Stay: 1  Current Medications: Scheduled Meds:  . buPROPion  150 mg Oral Daily  . calcium-vitamin D  2 tablet Oral Daily  . enoxaparin (LOVENOX) injection  40 mg Subcutaneous Q24H  . feeding supplement (ENSURE ENLIVE)  237 mL Oral BID BM  . fentaNYL  100 mcg Transdermal Q72H  . fentaNYL  75 mcg Transdermal Q72H  . naloxegol oxalate  12.5 mg Oral Daily  . pantoprazole  40 mg Oral QHS  . polyethylene glycol  17 g Oral TID  . senna  2 tablet Oral BID    Continuous Infusions:   PRN Meds: acetaminophen **OR** acetaminophen, bisacodyl, diphenhydrAMINE, hydrocortisone, HYDROmorphone (DILAUDID) injection, LORazepam, ondansetron (ZOFRAN) IV, sodium phosphate  Physical Exam         Awake alert oriented Abdomen soft No pain in abdomen S1 S2 Clear breath sounds No edema Non focal  Vital Signs: BP 93/64 (BP Location: Left Wrist)   Pulse 81   Temp 97.9 F (36.6 C) (Oral)   Resp 16   Ht 5\' 4"  (1.626 m)   Wt 58.6 kg (129 lb 3.2 oz)   LMP 11/23/2013 Comment: last period was very light, spotting the week before  SpO2 96%   BMI 22.18 kg/m  SpO2: SpO2: 96 % O2 Device: O2 Device: Not Delivered O2 Flow Rate:    Intake/output summary:  Intake/Output  Summary (Last 24 hours) at 10/04/16 1105 Last data filed at 10/04/16 0900  Gross per 24 hour  Intake              360 ml  Output             1200 ml  Net             -840 ml   LBM: Last BM Date: 10/02/16  Baseline Weight: Weight: 64.4 kg (142 lb) Most recent weight: Weight: 58.6 kg (129 lb 3.2 oz)       Palliative Assessment/Data:    Flowsheet Rows     Most Recent Value  Intake Tab  Referral Department  Hospitalist  Unit at Time of Referral  Oncology Unit  Palliative Care Primary Diagnosis  Cancer  Palliative Care Type  Return patient Palliative Care  Reason for referral  Non-pain Symptom, Clarify Goals of Care  Date first seen by Palliative Care  10/03/16  Clinical Assessment  Palliative Performance Scale Score  30%  Pain Max last 24 hours  7  Pain Min Last 24 hours  5  Dyspnea Max Last 24 Hours  3  Dyspnea Min Last 24 hours  2  Nausea Max Last 24 Hours  3  Nausea Min Last 24 Hours  2  Anxiety Max Last 24 Hours  5  Anxiety Min Last 24 Hours  4  Psychosocial & Spiritual Assessment  Palliative Care Outcomes  Patient/Family meeting held?  Yes  Who was at the meeting?  patient who is decisional.  Palliative Care Outcomes  Improved non-pain symptom therapy      Patient Active Problem List   Diagnosis Date Noted  . Cancer related pain 10/03/2016  . Encounter for palliative care   . Goals of care, counseling/discussion   . Anemia 10/02/2016  . Thrombocytosis ([place]) 10/02/2016  . Intraabdominal fluid collection 10/02/2016  . Protein calorie malnutrition ([place]) 10/02/2016  . Abdominal pain 10/02/2016  . Pancytopenia ([place]) 09/10/2016  . Intractable abdominal pain 09/09/2016  . Anxiety 09/09/2016  . Malignant ascites 09/09/2016  . Bone metastases ([employer]) 08/27/2016  . Metastatic breast cancer ([place]) 07/11/2016  . 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 Depression     Palliative Care Assessment & Plan   Patient Profile:    Assessment:  metastatic breast cancer Mets to  bone Constipation History of malignant ascites Abdominal pain related to cancer Denies nausea or vomiting.   Recommendations/Plan:   Start PO Movantik  Continue current pain regimen and current bowel regimen  Monitor PO intake  Follow up with oncology as outpatient.   Home with home health care on discharge.    Code Status:    Code Status Orders        Start     Ordered   10/02/16 1900  Do not attempt resuscitation (DNR)  Continuous    Question Answer Comment  In the event of cardiac or respiratory ARREST Do not call a "code blue"   In the event of cardiac or respiratory ARREST Do not perform Intubation, CPR, defibrillation or ACLS   In the event of cardiac or respiratory ARREST Use medication by any route, position, wound care, and other measures to relive pain and suffering. May use oxygen, suction and manual treatment of airway obstruction as needed for comfort.      10/02/16 1903    Code Status History    Date Active Date Inactive Code Status Order ID Comments User Context   09/09/2016 10:46 PM 09/16/2016  6:42 PM DNR [id number]  Reubin Milan, MD Inpatient   09/09/2016  9:58 PM 09/09/2016 10:46 PM Full Code [id number]  Reubin Milan, MD Inpatient    Advance Directive Documentation     Most Recent Value  Type of Advance Directive  Healthcare Power of Attorney, Living will  Pre-existing out of facility DNR order (yellow form or pink MOST form)  -  "MOST" Form in Place?  -  Prognosis:   Unable to determine  Discharge Planning:  Home with [place] was discussed with  Patient.   Thank you for allowing the Palliative Medicine Team to assist in the care of this patient.   Time In: 10.30 Time Out: 10.55 Total Time 25 Prolonged Time Billed  no       Greater than 50%  of this time was spent counseling and coordinating care related to the above assessment and plan.  Loistine Chance, MD [phone]  Please contact Palliative Medicine  Team phone at [phone] for questions and concerns.

## 2016-10-04 NOTE — Progress Notes (Signed)
[facility] PROGRESS NOTE  Tersa Fotopoulos PTW:[id number] DOB: 1965/08/07 DOA: 10/02/2016  PCP: Olena Mater, MD  Brief History/Interval Summary: 51 year old Caucasian female with a past medical history of metastatic breast cancer with the metastases to bone, as well as in the intra-abdominal area. She previously has had significant ascites and required catheter placement, which stopped working as the fluid became more loculated. Presented with complaints of abdominal pain and constipation. She is requiring high does not collect for pain control. CT scan revealed a complex appearing fluid collection in the abdomen. She was hospitalized for further management.  Reason for Visit: Abdominal pain related to cancer. Constipation. Intra-abdominal fluid collection.  Consultants: Interventional radiology. Oncology. Palliative medicine  Procedures: None yet  Antibiotics: None  Subjective/Interval History: Patient noted to be in better spirits this morning. She hasn't had a bowel movement since Saturday evening. Abdominal pain is about the same as usual. Appetite remains poor, although she denies any nausea or vomiting.   ROS: No chest pain or shortness of breath  Objective:  Vital Signs  Vitals:   10/03/16 0601 10/03/16 1409 10/03/16 2225 10/04/16 0609  BP: 104/70 115/79 97/65 93/64   Pulse: 85 89 93 81  Resp: 20 18 16 16   Temp: 98.1 F (36.7 C) 98.2 F (36.8 C) 98.5 F (36.9 C) 97.9 F (36.6 C)  TempSrc: Oral Oral Oral Oral  SpO2: 95% 97% 96% 96%  Weight:    58.6 kg (129 lb 3.2 oz)  Height:        Intake/Output Summary (Last 24 hours) at 10/04/16 0829 Last data filed at 10/04/16 0340  Gross per 24 hour  Intake              120 ml  Output             1200 ml  Net            -1080 ml   Filed Weights   10/02/16 1322 10/02/16 2022 10/04/16 0609  Weight: 64.4 kg (142 lb) 64.4 kg (142 lb) 58.6 kg (129 lb 3.2 oz)    General appearance: Awake, alert. In no distress.  Seems to be more comfortable this morning. Resp: Clear to auscultation bilaterally Cardio: S1, S2 is normal, regular. No S3, S4. No rubs, murmurs, bruits GI: Abdomen is soft. Remains tender diffusely but not as much as yesterday. Bowel sounds are present. No definite masses on examination. No organomegaly.  Extremities: edema 1+ Neurologic: No focal neurological deficits.  Lab Results:  Data Reviewed: I have personally reviewed following labs and imaging studies  CBC:  Recent Labs Lab 09/29/16 1030 10/02/16 1510 10/03/16 0500 10/04/16 0430  WBC 4.6 7.1 4.9 4.4  NEUTROABS 2.7 4.9  --   --   HGB 7.2* 10.3* 9.4* 8.9*  HCT 23.6* 32.2* 30.0* 28.2*  MCV 95.9 91.7 92.9 92.5  PLT 374 446* 399 700    Basic Metabolic Panel:  Recent Labs Lab 09/29/16 1031 10/02/16 1510 10/03/16 0500 10/04/16 0430  NA 134* 137 139 139  K 4.0 4.1 3.5 3.9  CL  --  97* 102 100*  CO2 30* 30 30 30   GLUCOSE 90 94 89 90  BUN 6.1* 7 6 <5*  CREATININE 0.7 0.60 0.41* 0.45  CALCIUM 8.7 8.5* 7.8* 8.4*    GFR: Estimated Creatinine Clearance: 71 mL/min (by C-G formula based on SCr of 0.45 mg/dL).  Liver Function Tests:  Recent Labs Lab 09/29/16 1031 10/02/16 1510  AST 38* 31  ALT 19 22  ALKPHOS 107 95  BILITOT 0.27 0.4  PROT 6.7 7.1  ALBUMIN 1.8* 2.2*     Recent Labs Lab 10/02/16 1510  AMMONIA 15     Radiology Studies: Ct Abdomen Pelvis W Contrast  Result Date: 10/02/2016 CLINICAL DATA:  51 year old female with history of metastatic breast cancer. Constipation. No bowel movement in the past 3 days. Nausea. EXAM: CT ABDOMEN AND PELVIS WITH CONTRAST TECHNIQUE: Multidetector CT imaging of the abdomen and pelvis was performed using the standard protocol following bolus administration of intravenous contrast. CONTRAST:  167mL ISOVUE-300 IOPAMIDOL (ISOVUE-300) INJECTION 61% COMPARISON:  CT of the abdomen and pelvis 09/09/2016. FINDINGS: Lower chest: Status post right mastectomy with  right-sided breast implant incidentally noted. Tip of central venous catheter in the right atrium. Trace right pleural effusion with some subsegmental atelectasis or scarring in the right lower lobe. Hepatobiliary: A few tiny subcentimeter low-attenuation lesions are noted in the right lobe of the liver, too small to characterize. No larger more suspicious appearing hepatic lesions are noted. No intra or extrahepatic biliary ductal dilatation. Gallbladder is moderately distended, but otherwise unremarkable in appearance. Pancreas: No pancreatic mass. No pancreatic ductal dilatation. No pancreatic or peripancreatic fluid or inflammatory changes. Spleen: Unremarkable. Adrenals/Urinary Tract: 4 mm nonobstructive calculus in the upper pole collecting system of the right kidney. Left kidney is normal in appearance. No hydroureteronephrosis. Urinary bladder is moderately distended, but otherwise unremarkable in appearance. Large bilateral adrenal lesions are again noted, most evident on the right side where there is a 2.8 x 5.4 x 6.3 cm right adrenal mass (axial image 18 of series 2 and coronal image 73 of series 7) which appears to potentially directly invade the adjacent inferior vena cava. Stomach/Bowel: The appearance of the stomach is normal. No pathologic dilatation of small bowel or colon. Moderate volume of well-formed stool throughout the distal colon and rectum, compatible with the reported clinical history of constipation. Appendix is not confidently identified. There is again an unusual collection of gas anterior to the liver best appreciated on axial image 3 of series 2 and coronal image 27 of series 7, which appears to arise from a loop of small bowel, very similar to prior study 09/09/2016. Vascular/Lymphatic: Aortic atherosclerosis, without evidence of aneurysm or dissection in the abdominal or pelvic vasculature. Multiple borderline enlarged retroperitoneal lymph nodes measuring up to 7 mm in short axis  are noted. Reproductive: Uterus and left ovary are unremarkable in appearance. Enhancing soft tissue mass is intimately associated with the right adnexa, as detailed below, similar to the prior study. Other: Large heterogeneously enhancing soft tissue masses in the right lower quadrant intimately associated with the left adnexa. The largest of these is medial measuring up to 5.6 x 5.8 cm, while the smaller lateral lesion measures 5.2 x 4.6 cm (axial images 57 and 55 of series 2 respectively). Large volume of ascites with extensive thickening and enhancement of the peritoneal membranes, with the largest collection of this fluid in the anterior aspect of the abdomen adjacent to the previously described enhancing soft tissue lesions. Overall, the volume of ascites has decreased compared to the prior study, but now appears more focally loculated. Previously noted peritoneal drainage catheter has been removed. Other than the collection of gas anterior to the liver which could conceivably represent some loculated pneumoperitoneum, there is no gross pneumoperitoneum elsewhere. Musculoskeletal: Again noted are several mixed lytic and sclerotic lesions throughout the visualized axial and appendicular skeleton, with the largest of these again in the T8 vertebral body  where there is a pathologic compression fracture and approximately 20% loss of anterior vertebral body height, similar to the prior study. IMPRESSION: 1. Overall, although the volume of malignant ascites has decreased compared to the prior study, and now appears focally loculated most evident in the anterior aspect of the abdomen/pelvis, surrounded by thickened and enhancing peritoneal membranes. This could simply be secondary to malignancy, however, the possibility of infection of this fluid following drainage with developing abscess is not entirely excluded. 2. Persistent gas collection anterior to the liver, very similar in appearance to the prior study with  apparent communication with small bowel. This is unusual in appearance, and the possibility of focally contained perforated loop of distal small bowel remains of concern. However, a large diverticulum of the small bowel could also be considered, particularly in light of the stability of this finding. 3. 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 Tiny subcentimeter low-attenuation lesions in the right lobe of the liver are too small to definitively characterize, but could represent additional sites of metastatic disease. Attention on followup studies is recommended. 4. Aortic atherosclerosis. Electronically Signed   By: Vinnie Langton M.D.   On: 10/02/2016 18:17   Dg Abd Acute W/chest  Result Date: 10/02/2016 CLINICAL DATA:  Abdominal pain.  Vomiting. EXAM: DG ABDOMEN ACUTE W/ 1V CHEST COMPARISON:  None. FINDINGS: There is no evidence of dilated bowel loops or free intraperitoneal air. No radiopaque calculi or other significant radiographic abnormality is seen. Heart size and mediastinal contours are within normal limits. Both lungs are clear. IMPRESSION: No evidence of bowel obstruction or ileus. No acute cardiopulmonary disease. Electronically Signed   By: Marijo Conception, M.D.   On: 10/02/2016 16:11     Medications:  Scheduled: . buPROPion  150 mg Oral Daily  . calcium-vitamin D  2 tablet Oral Daily  . enoxaparin (LOVENOX) injection  40 mg Subcutaneous Q24H  . feeding supplement (ENSURE ENLIVE)  237 mL Oral BID BM  . fentaNYL  100 mcg Transdermal Q72H  . fentaNYL  75 mcg Transdermal Q72H  . pantoprazole  40 mg Oral QHS  . polyethylene glycol  17 g Oral TID  . senna  2 tablet Oral BID   Continuous:  [id number] **OR**  acetaminophen, bisacodyl, diphenhydrAMINE, hydrocortisone, HYDROmorphone (DILAUDID) injection, LORazepam, ondansetron (ZOFRAN) IV, sodium phosphate  Assessment/Plan:  Principal Problem:   Intractable abdominal pain Active Problems:   Depression   Metastatic breast cancer ([facility])   Anxiety   Malignant ascites   Anemia   Thrombocytosis ([facility])   Intraabdominal fluid collection   Protein calorie malnutrition ([facility])   Abdominal pain   Encounter for palliative care   Goals of care, counseling/discussion   Cancer related pain    Abdominal pain, most likely due to her metastatic cancer , worsened by constipation CT scan findings noted. Discussed with interventional radiology. Most of these findings are chronic. Any intervention would subject her to high risk of infection. Patient was also constipated which is also contributing to her symptoms. Last bowel movement was on Saturday. We increased the dose of her laxatives yesterday and we will further increase today. It looks like she was started on Relistor by her oncologist a few weeks ago for constipation. She did take 1 dose and had a brisk  response, but insurance did not approve further doses. Appreciate palliative medicine input. Oral Movantik is being considered.  Metastatic breast cancer with cancer-related pain. Followed by oncology for recurrent breast cancer. Treatment has been complicated by severe pancytopenia and her Palbociclib has been held. Faslodex was given. She has been treated with radiation as well. Oncology made aware. Patient is also on high dose fentanyl patch for pain control. Her pain appears to be reasonably well controlled. Mobilize.  Malignant ascites with loculated collection. She has had a peritoneal catheter previously which had to be removed at as it was not draining the collection. This collection has been seen before. IR was consulted. However, they do not feel that this is amenable to drainage. This is an unfortunate  situation. Discussed with the patient and she understands. Await oncology feedback.  Normocytic anemia/anemia of chronic disease. Patient recently transfused PRBCs on 9/6. Continue to monitor hemoglobin. Slightly lower today. No obvious overt bleeding. Continue to monitor.  Depression and anxiety. Continue home medications.  Severe protein calorie malnutrition. Continue supplements.  Edema in the lower extremities. Most likely due to hypoalbuminemia and third spacing. Continue to monitor for now. 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  DVT Prophylaxis: Lovenox    Code Status: DO NOT RESUSCITATE  Family Communication: Discussed with the patient  Disposition Plan: Management as outlined above. Mobilize. Would like to see some resolution of her constipation prior to discharge.    LOS: 1 day   [facility] Pager [phone] 10/04/2016, 8:29 AM  If 7PM-7AM, please contact night-coverage at [email or web address], password [facility]

## 2016-10-04 NOTE — Discharge Summary (Signed)
[facility]  Physician Discharge Summary   Patient ID: Tamara Byrd MRN: [id number] DOB/AGE: [birth year] 51 y.o.  Admit date: 10/02/2016 Discharge date: 10/04/2016  PCP: Olena Mater, MD  DISCHARGE DIAGNOSES:  Principal Problem:   Intractable abdominal pain Active Problems:   Depression   Metastatic breast cancer ([place])   Anxiety   Malignant ascites   Anemia   Thrombocytosis (H[facility])   Intraabdominal fluid collection   Protein calorie malnutrition (HCC)   Abdominal pain   Encounter for palliative care   Goals of care, counseling/discussion   Cancer related pain   Constipation   RECOMMENDATIONS FOR OUTPATIENT FOLLOW UP: 1. Follow-up with oncology in 2 weeks for blood work   DISCHARGE CONDITION: fair  Diet recommendation: Regular  Filed Weights   10/02/16 1322 10/02/16 2022 10/04/16 0609  Weight: 64.4 kg (142 lb) 64.4 kg (142 lb) 58.6 kg (129 lb 3.2 oz)    INITIAL HISTORY: 51 year old Caucasian female with a past medical history of metastatic breast cancer with the metastases to bone, as well as in the intra-abdominal area. She previously has had significant ascites and required catheter placement, which stopped working as the fluid became more loculated. Presented with complaints of abdominal pain and constipation. She is requiring high does not collect for pain control. CT scan revealed a complex appearing fluid collection in the abdomen. She was hospitalized for further management.  Consultations: Interventional radiology. Oncology. Palliative medicine   HOSPITAL COURSE:   Abdominal pain, most likely due to her metastatic cancer, worsened by constipation CT scan findings noted. Discussed with interventional radiology. Most of these findings are chronic. Any intervention would subject her to high risk of infection. Patient was also constipated which is also contributing to her symptoms. Patient is on high does not cover Exforge. Her cancer related pain.  This is the reason for her constipation. Laxative dose was increased. Palliative medicine. Also provide assistance in this matter. It looks like she was started on Relistor by her oncologist a few weeks ago for constipation. She did take 1 dose and had a brisk response, but insurance did not approve further doses. Oral Movantik  has been initiated by palliative medicine. Patient had a large bowel movement today. She feels much better and wishes to go home.  Metastatic breast cancer with cancer-related pain. Followed by oncology for recurrent breast cancer. Treatment has been complicated by severe pancytopenia and her Palbociclib was held. Faslodex was given as outpatient. She has been treated with radiation as well. Patient is also on high dose fentanyl patch for pain control. Her pain appears to be reasonably well controlled. Seen by her oncologist. He has recommended that patient resume her Palbociclib.   Malignant ascites with loculated collection. She has had a peritoneal catheter previously which had to be removed at as it was not draining the collection. This collection has been seen before. IR was consulted. However, they do not feel that this is amenable to drainage.  Normocytic anemia/anemia of chronic disease. Patient recently transfused PRBCs on 9/6. Slight drop in hemoglobin noted today. No evidence for overt bleeding. She will have outpatient blood work at her oncologist office.  Depression and anxiety. Continue home medications.  Severe protein calorie malnutrition. Continue supplements.  Edema in the lower extremities. Most likely due to hypoalbuminemia and third spacing. 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  Overall, stable. Since patient has had a bowel movement and is feeling much better, it is reasonable to discharge her at this time.  PERTINENT LABS:  The results of significant diagnostics from this hospitalization (including imaging, microbiology,  ancillary and laboratory) are listed below for reference.    Microbiology: Recent Results (from the past 240 hour(s))  Urine culture     Status: None   Collection Time: 10/02/16  9:22 PM  Result Value Ref Range Status   Specimen Description URINE, RANDOM  Final   Special Requests NONE  Final   Culture   Final    NO GROWTH Performed at [facility], [address] [place], [place] [address]    Report Status 10/04/2016 FINAL  Final     Labs: Basic Metabolic Panel:  Recent Labs Lab 09/29/16 1031 10/02/16 1510 10/03/16 0500 10/04/16 0430  NA 134* 137 139 139  K 4.0 4.1 3.5 3.9  CL  --  97* 102 100*  CO2 30* 30 30 30   GLUCOSE 90 94 89 90  BUN 6.1* 7 6 <5*  CREATININE 0.7 0.60 0.41* 0.45  CALCIUM 8.7 8.5* 7.8* 8.4*   Liver Function Tests:  Recent Labs Lab 09/29/16 1031 10/02/16 1510  AST 38* 31  ALT 19 22  ALKPHOS 107 95  BILITOT 0.27 0.4  PROT 6.7 7.1  ALBUMIN 1.8* 2.2*    Recent Labs Lab 10/02/16 1510  AMMONIA 15   CBC:  Recent Labs Lab 09/29/16 1030 10/02/16 1510 10/03/16 0500 10/04/16 0430  WBC 4.6 7.1 4.9 4.4  NEUTROABS 2.7 4.9  --   --   HGB 7.2* 10.3* 9.4* 8.9*  HCT 23.6* 32.2* 30.0* 28.2*  MCV 95.9 91.7 92.9 92.5  PLT 374 446* 399 395     IMAGING STUDIES Ct Abdomen Pelvis Wo Contrast  Result Date: 09/09/2016 CLINICAL DATA:  51 year old female with history of abdominal pain radiating to the back. History of breast cancer, currently undergoing therapy. EXAM: CT ABDOMEN AND PELVIS WITHOUT CONTRAST TECHNIQUE: Multidetector CT imaging of the abdomen and pelvis was performed following the standard protocol without IV contrast. COMPARISON:  None. FINDINGS: Lower chest: Small right and trace pleural effusions lying dependently with some associated passive subsegmental atelectasis in the dependent portions of the lower lobes of the lungs bilaterally. Status post right mastectomy with right-sided breast implant. Hepatobiliary: No discrete cystic  or solid hepatic lesions are confidently identified on today's noncontrast CT examination. Unenhanced appearance of the gallbladder is unremarkable. Pancreas: No definite pancreatic mass or peripancreatic inflammatory changes are noted on today's noncontrast CT examination. Spleen: Unremarkable. Adrenals/Urinary Tract: 4 mm nonobstructive calculus in the upper pole collecting system of the right kidney. No additional calculi are identified within the left renal collecting system, along the course of either ureter or within the lumen of the urinary bladder. No hydroureteronephrosis to indicate urinary tract obstruction at this time. Urinary bladder is normal in appearance. Bilateral adrenal glands are enlarged and nodular in appearance, with the largest of these masses noted on the right side measuring approximately 2.9 x 4.8 cm (axial image 30 of series 2) with either significant mass effect upon or direct invasion into the adjacent inferior vena cava. Stomach/Bowel: Unenhanced appearance of the stomach is normal. No pathologic dilatation of small bowel or colon. The appendix is not confidently identified. In the right upper quadrant of the abdomen immediately anterior to the liver best appreciated on axial image 15 of series 2, coronal image 52 of series 4 and sagittal image 65 of series 5 there is a gas collection which appears to be extraluminal. Based on the appearance on coronal image 52  of series 4, this appears to represent focal contained perforation of a loop of ileum (best appreciated when viewing images on lung window setting). Vascular/Lymphatic: Aortic atherosclerosis. No definite aneurysm identified in the abdominal or pelvic vasculature on today's noncontrast CT examination. Multiple prominent borderline enlarged retroperitoneal lymph nodes measuring up to 8 mm in short axis are nonspecific. Reproductive: Unenhanced appearance of the uterus and left ovary is unremarkable. In the right side of the  pelvis closely associated with the right adnexal region there is a large soft tissue mass measuring 7.5 x 9.3 x 6.0 cm (axial image 76 of series 2 and coronal image 57 of series 4). Other: Large volume of ascites. Abdominal drain entering the right-sided abdomen with the tip extending into the mid anatomic pelvis anteriorly slightly to the left of midline. No pneumoperitoneum. Musculoskeletal: Several mixed lytic and sclerotic lesions are noted throughout the visualized axial and appendicular skeleton, compatible with widespread metastatic disease to the bones. The largest of these lesions occupies the T8 vertebral body where there is a pathologic compression fracture with approximately 15-20% loss of anterior vertebral body height. IMPRESSION: 1. Findings are highly concerning for contained perforated loop of distal small bowel (presumably ileum) in the right upper quadrant immediately anterior to the liver, as detailed above. Surgical consultation is recommended. 2. 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 3. Large volume of presumably malignant ascites. 4. Small right and trace left pleural effusions with areas of passive atelectasis in the lung bases bilaterally. 5. Aortic atherosclerosis. 6. Additional incidental findings, as above. These results were called by telephone at the time of interpretation on 09/09/2016 at 6:51 pm to Dr. Canary Brim, who verbally acknowledged these results. Aortic Atherosclerosis (ICD10-I70.0). Electronically Signed   By: Vinnie Langton M.D.   On: 09/09/2016 18:59   Ct Abdomen Pelvis W Contrast  Result  Date: 10/02/2016 CLINICAL DATA:  51 year old female with history of metastatic breast cancer. Constipation. No bowel movement in the past 3 days. Nausea. EXAM: CT ABDOMEN AND PELVIS WITH CONTRAST TECHNIQUE: Multidetector CT imaging of the abdomen and pelvis was performed using the standard protocol following bolus administration of intravenous contrast. CONTRAST:  133mL ISOVUE-300 IOPAMIDOL (ISOVUE-300) INJECTION 61% COMPARISON:  CT of the abdomen and pelvis 09/09/2016. FINDINGS: Lower chest: Status post right mastectomy with right-sided breast implant incidentally noted. Tip of central venous catheter in the right atrium. Trace right pleural effusion with some subsegmental atelectasis or scarring in the right lower lobe. Hepatobiliary: A few tiny subcentimeter low-attenuation lesions are noted in the right lobe of the liver, too small to characterize. No larger more suspicious appearing hepatic lesions are noted. No intra or extrahepatic biliary ductal dilatation. Gallbladder is moderately distended, but otherwise unremarkable in appearance. Pancreas: No pancreatic mass. No pancreatic ductal dilatation. No pancreatic or peripancreatic fluid or inflammatory changes. Spleen: Unremarkable. Adrenals/Urinary Tract: 4 mm nonobstructive calculus in the upper pole collecting system of the right kidney. Left kidney is normal in appearance. No hydroureteronephrosis. Urinary bladder is moderately distended, but otherwise unremarkable in appearance. Large bilateral adrenal lesions are again noted, most evident on the right side where there is a 2.8 x 5.4 x 6.3 cm right adrenal  mass (axial image 18 of series 2 and coronal image 73 of series 7) which appears to potentially directly invade the adjacent inferior vena cava. Stomach/Bowel: The appearance of the stomach is normal. No pathologic dilatation of small bowel or colon. Moderate volume of well-formed stool throughout the distal colon and rectum, compatible with the reported  clinical history of constipation. Appendix is not confidently identified. There is again an unusual collection of gas anterior to the liver best appreciated on axial image 3 of series 2 and coronal image 27 of series 7, which appears to arise from a loop of small bowel, very similar to prior study 09/09/2016. Vascular/Lymphatic: Aortic atherosclerosis, without evidence of aneurysm or dissection in the abdominal or pelvic vasculature. Multiple borderline enlarged retroperitoneal lymph nodes measuring up to 7 mm in short axis are noted. Reproductive: Uterus and left ovary are unremarkable in appearance. Enhancing soft tissue mass is intimately associated with the right adnexa, as detailed below, similar to the prior study. Other: Large heterogeneously enhancing soft tissue masses in the right lower quadrant intimately associated with the left adnexa. The largest of these is medial measuring up to 5.6 x 5.8 cm, while the smaller lateral lesion measures 5.2 x 4.6 cm (axial images 57 and 55 of series 2 respectively). Large volume of ascites with extensive thickening and enhancement of the peritoneal membranes, with the largest collection of this fluid in the anterior aspect of the abdomen adjacent to the previously described enhancing soft tissue lesions. Overall, the volume of ascites has decreased compared to the prior study, but now appears more focally loculated. Previously noted peritoneal drainage catheter has been removed. Other than the collection of gas anterior to the liver which could conceivably represent some loculated pneumoperitoneum, there is no gross pneumoperitoneum elsewhere. Musculoskeletal: Again noted are several mixed lytic and sclerotic lesions throughout the visualized axial and appendicular skeleton, with the largest of these again in the T8 vertebral body where there is a pathologic compression fracture and approximately 20% loss of anterior vertebral body height, similar to the prior study.  IMPRESSION: 1. Overall, although the volume of malignant ascites has decreased compared to the prior study, and now appears focally loculated most evident in the anterior aspect of the abdomen/pelvis, surrounded by thickened and enhancing peritoneal membranes. This could simply be secondary to malignancy, however, the possibility of infection of this fluid following drainage with developing abscess is not entirely excluded. 2. Persistent gas collection anterior to the liver, very similar in appearance to the prior study with apparent communication with small bowel. This is unusual in appearance, and the possibility of focally contained perforated loop of distal small bowel remains of concern. However, a large diverticulum of the small bowel could also be considered, particularly in light of the stability of this finding. 3. 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 Tiny subcentimeter low-attenuation lesions in the right lobe of the liver are too small to definitively characterize, but could represent additional sites of metastatic disease. Attention on followup studies is recommended. 4. Aortic atherosclerosis. Electronically Signed   By: Vinnie Langton M.D.   On: 10/02/2016 18:17   US Abdomen Limited  Result Date: 09/14/2016 CLINICAL DATA:  Evaluate for paracentesis EXAM: LIMITED ABDOMEN ULTRASOUND FOR ASCITES TECHNIQUE: Limited ultrasound survey for ascites was  performed in all four abdominal quadrants. COMPARISON:  None. FINDINGS: Imaging in the abdomen demonstrates a highly loculated ascites. There is no large pocket available for decompression IMPRESSION: See above comments. Electronically Signed   By: Marybelle Killings M.D.   On: 09/14/2016  15:12   US Abdomen Limited  Result Date: 09/13/2016 CLINICAL DATA:  Evaluate ascites. EXAM: LIMITED ABDOMEN ULTRASOUND FOR ASCITES TECHNIQUE: Limited ultrasound survey for ascites was performed in all four abdominal quadrants. COMPARISON:  None. FINDINGS: Largely loculated ascites is seen in all 4 quadrants, particularly on the left. IMPRESSION: Largely loculated pockets of ascites are identified, most prominent on the left. Electronically Signed   By: Dorise Bullion III M.D   On: 09/13/2016 17:43   US Paracentesis  Result Date: 09/11/2016 INDICATION: History of metastatic breast cancer with metastatic tumor in the pelvis. Recurrent abdominal ascites. Patient had a PleurX catheter placed [place] about 6 weeks ago. Unfortunately, this is no longer working. Request is made today for evaluation for a paracentesis since her catheter is not working. EXAM: ULTRASOUND GUIDED DIAGNOSTIC AND THERAPEUTIC PARACENTESIS MEDICATIONS: 2% xylocaine with epinephrine COMPLICATIONS: None immediate. PROCEDURE: Informed written consent was obtained from the patient after a discussion of the risks, benefits and alternatives to treatment. A timeout was performed prior to the initiation of the procedure. Initial ultrasound scanning demonstrates a small amount of loculated ascites within the left upper abdominal quadrant. The left upper abdomen was prepped and draped in the usual sterile fashion. 2% xylocaine with epinephrine was used for local anesthesia. Following this, a 19 gauge, 7-cm, Yueh catheter was introduced. An ultrasound image was saved for documentation purposes. The paracentesis was performed. The catheter was removed and a dressing was applied. The patient tolerated the procedure well without immediate post procedural complication. FINDINGS: A total of approximately 1.6 L of serous fluid was removed. Samples were sent to the laboratory as requested by the clinical team. The patient was noted to have significant  complex ascites noted throughout her abdomen. This was most densely loculated within the pelvis. IMPRESSION: Successful ultrasound-guided paracentesis yielding 1.6 liters of peritoneal fluid. Complex ascites with significant loculations, specifically within the pelvis. PleurX catheter is unlikely to ever work given complexity of fluid. Recommendation would be for removal. This was discussed with Dr. Earleen Newport as well as Dr. Posey Pronto. Read by: Saverio Danker, PA-C Electronically Signed   By: Corrie Mckusick D.O.   On: 09/11/2016 15:10   Ir Removal Perm Peritoneal Cath  Result Date: 09/13/2016 INDICATION: 51 year old with history of metastatic breast cancer. Patient has a tunneled peritoneal catheter which is no longer working and loculated ascites on recent imaging. Plan for removal of the peritoneal catheter. EXAM: REMOVAL OF TUNNELED PERITONEAL CATHETER MEDICATIONS: None ANESTHESIA/SEDATION: None COMPLICATIONS: None immediate. PROCEDURE: Informed written consent was obtained from the patient after a thorough discussion of the procedural risks, benefits and alternatives. All questions were addressed. Maximal Sterile Barrier Technique was utilized including caps, mask, sterile gowns, sterile gloves, sterile drape, hand hygiene and skin antiseptic. A timeout was performed prior to the initiation of the procedure. The right abdominal catheter was prepped and draped in a sterile fashion. Skin around the catheter was anesthetized with 1% lidocaine with epinephrine. The cuff was dissected free and exposed. The catheter was removed without complication. Bandage placed over the catheter exit site. IMPRESSION: Successful removal of the tunneled peritoneal catheter. Electronically Signed   By: Markus Daft M.D.   On: 09/13/2016 15:26   Dg Abd Acute W/chest  Result  Date: 10/02/2016 CLINICAL DATA:  Abdominal pain.  Vomiting. EXAM: DG ABDOMEN ACUTE W/ 1V CHEST COMPARISON:  None. FINDINGS: There is no evidence of dilated bowel loops  or free intraperitoneal air. No radiopaque calculi or other significant radiographic abnormality is seen. Heart size and mediastinal contours are within normal limits. Both lungs are clear. IMPRESSION: No evidence of bowel obstruction or ileus. No acute cardiopulmonary disease. Electronically Signed   By: Marijo Conception, M.D.   On: 10/02/2016 16:11    DISCHARGE EXAMINATION: Vitals:   10/03/16 1409 10/03/16 2225 10/04/16 0609 10/04/16 1414  BP: 115/79 97/65 93/64  92/71  Pulse: 89 93 81 88  Resp: 18 16 16 16   Temp: 98.2 F (36.8 C) 98.5 F (36.9 C) 97.9 F (36.6 C) 98.3 F (36.8 C)  TempSrc: Oral Oral Oral Oral  SpO2: 97% 96% 96% 95%  Weight:   58.6 kg (129 lb 3.2 oz)   Height:       General appearance: alert, cooperative, appears stated age and no distress Resp: clear to auscultation bilaterally Cardio: regular rate and rhythm, S1, S2 normal, no murmur, click, rub or gallop GI: . Tender diffusely without any rebound, rigidity or guarding. No masses or organomegaly   DISPOSITION: Home  Discharge Instructions    Call MD for:  difficulty breathing, headache or visual disturbances    Complete by:  As directed    Call MD for:  extreme fatigue    Complete by:  As directed    Call MD for:  persistant dizziness or light-headedness    Complete by:  As directed    Call MD for:  persistant nausea and vomiting    Complete by:  As directed    Call MD for:  severe uncontrolled pain    Complete by:  As directed    Call MD for:  temperature >100.4    Complete by:  As directed    Diet general    Complete by:  As directed    Discharge instructions    Complete by:  As directed    Take medications as instructed. Follow up with Dr. Lindi Adie. May cut down Miralax to twice a day if more than 3 bm's per day.  You were cared for by a hospitalist during your hospital stay. If you have any questions about your discharge medications or the care you received while you were in the hospital after you  are discharged, you can call the unit and asked to speak with the hospitalist on call if the hospitalist that took care of you is not available. Once you are discharged, your primary care physician will handle any further medical issues. Please note that NO REFILLS for any discharge medications will be authorized once you are discharged, as it is imperative that you return to your primary care physician (or establish a relationship with a primary care physician if you do not have one) for your aftercare needs so that they can reassess your need for medications and monitor your lab values. If you do not have a primary care physician, you can call [phone] for a physician referral.   Increase activity slowly    Complete by:  As directed       ALLERGIES: No Known Allergies   Current Discharge Medication List    START taking these medications   Details  naloxegol oxalate (MOVANTIK) 12.5 MG TABS tablet Take 1 tablet (12.5 mg total) by mouth daily. Qty: 30 tablet, Refills: 0    senna (SENOKOT)  8.6 MG TABS tablet Take 2 tablets (17.2 mg total) by mouth 2 (two) times daily. Qty: 120 each, Refills: 0      CONTINUE these medications which have CHANGED   Details  furosemide (LASIX) 40 MG tablet Take 1 tablet (40 mg total) by mouth daily. Qty: 60 tablet, Refills: 0    polyethylene glycol (MIRALAX / GLYCOLAX) packet Take 17 g by mouth 3 (three) times daily. Qty: 90 each, Refills: 0      CONTINUE these medications which have NOT CHANGED   Details  buPROPion (WELLBUTRIN XL) 150 MG 24 hr tablet Take 150 mg by mouth daily.    Calcium Carbonate-Vit D-Min (CALCIUM 1200) 1200-1000 MG-UNIT CHEW Chew 1 tablet by mouth daily. Qty: 30 each    diphenhydrAMINE (BENADRYL) 25 mg capsule Take 1 capsule (25 mg total) by mouth at bedtime as needed for sleep.    fentaNYL (DURAGESIC - DOSED MCG/HR) 100 MCG/HR Place 1 patch (100 mcg total) onto the skin every 3 (three) days. Qty: 10 patch, Refills: 0      fentaNYL (DURAGESIC - DOSED MCG/HR) 75 MCG/HR Place 1 patch (75 mcg total) onto the skin every 3 (three) days. Qty: 10 patch, Refills: 0    HYDROmorphone (DILAUDID) 4 MG tablet Take 1-2 tablets (4-8 mg total) by mouth every 3 (three) hours as needed for moderate pain or severe pain. Qty: 30 tablet, Refills: 0    LORazepam (ATIVAN) 0.5 MG tablet Take 1 tablet (0.5 mg total) by mouth 2 (two) times daily as needed (anxiety or sleep). Qty: 20 tablet, Refills: 0   Associated Diagnoses: Anxiety    ondansetron (ZOFRAN) 8 MG tablet Take 1 tablet (8 mg total) by mouth every 8 (eight) hours as needed for nausea or vomiting. Qty: 20 tablet, Refills: 0    pantoprazole (PROTONIX) 40 MG tablet Take 1 tablet (40 mg total) by mouth at bedtime. Qty: 30 tablet, Refills: 0    potassium chloride SA (K-DUR,KLOR-CON) 20 MEQ tablet Take 2 tablets (40 mEq total) by mouth daily. Qty: 30 tablet, Refills: 0    PROCTOZONE-HC 2.5 % rectal cream Apply 1 application topically daily as needed. Refills: 0    palbociclib (IBRANCE) 100 MG capsule Take 1 capsule (100 mg total) by mouth daily with breakfast. Take whole with food. Take for 3 weeks on, 1 week off. Qty: 21 capsule, Refills: 0   Associated Diagnoses: Metastatic breast cancer ([place])      STOP taking these medications     docusate sodium (COLACE) 100 MG capsule      Methylnaltrexone Bromide (RELISTOR) 150 MG TABS          Follow-up Information    Nicholas Lose, MD Follow up.   Specialty:  Hematology and Oncology Contact information: [place] [id number] [phone]           TOTAL DISCHARGE TIME: 53 minutes  [facility] Pager [phone]  10/04/2016, 4:20 PM

## 2016-10-04 NOTE — Discharge Instructions (Signed)

## 2016-10-05 ENCOUNTER — Telehealth: Payer: Self-pay | Admitting: *Deleted

## 2016-10-05 ENCOUNTER — Encounter: Payer: Self-pay | Admitting: Hematology and Oncology

## 2016-10-05 ENCOUNTER — Telehealth: Payer: Self-pay | Admitting: Pharmacist

## 2016-10-05 ENCOUNTER — Ambulatory Visit: Payer: BLUE CROSS/BLUE SHIELD

## 2016-10-05 ENCOUNTER — Other Ambulatory Visit: Payer: Self-pay | Admitting: Hematology and Oncology

## 2016-10-05 DIAGNOSIS — C50919 Malignant neoplasm of unspecified site of unspecified female breast: Secondary | ICD-10-CM

## 2016-10-05 MED FILL — IBRANCE 100 MG CAPSULE: 100 | 21 days supply | Qty: 21 | Fill #0

## 2016-10-05 NOTE — Telephone Encounter (Addendum)
This RN spoke with Lobbyist with [facility] per follow up post d/c:  1. Does pt need to continue IV antibiotic ?  2. Prescription for Movantik given upon discharge for severe constipation due to opoid use for pain control secondary to metastatic breast cancer which needs prior authorization.  Pt completed IV antibiotic therapy on 9/5.  Request for [facility] will be forwarded to [facility] department for authorization. Per faxed correspondence contact number to obtain PA is [phone].  Movantik 12.5 mg  1 tab daily # 30 tablets.  Return call number for [facility] is [phone].

## 2016-10-05 NOTE — Telephone Encounter (Signed)
Oral Chemotherapy Pharmacist Encounter  Per discussion MD, OK to fill Ibrance 100mg  capsule prescription at the [facility] on manufacturer free voucher now that patient has been discharged from the hospital.  Refill coordinated with the pharmacy. They will reach out to the patient when ready for pick-up.  Noted patient with injection appointment scheduled for 10/19/16 Patient needs lab check added that day to make sure appropriate Ibrance dose sent to pharmacy for 3rd cycle. This prescription will need to be filled through [facility] per insurance requirement.  Oral Oncology Clinic will continue to follow.  Thank you,  Johny Drilling, PharmD, BCPS, BCOP 10/05/2016  4:31 PM Oral Oncology Clinic [phone]

## 2016-10-06 ENCOUNTER — Telehealth: Payer: Self-pay | Admitting: *Deleted

## 2016-10-06 ENCOUNTER — Telehealth: Payer: Self-pay | Admitting: Hematology and Oncology

## 2016-10-06 NOTE — Telephone Encounter (Signed)
Spoke with patient regarding her appts on 9/25. However, she said that she will have to call back because she does not know if she is able to keep that appt.

## 2016-10-06 NOTE — Telephone Encounter (Signed)
FYI Voicemail:  "Waiting for Dr. Lindi Adie to do a peer to peer with [facility] for a medication I'm requesting, Movantik.  Please call me to let me know what's going on.  [phone]."    Per Financial Advocate/Drug Replacement Specialist, Prior authorization was sent yesterday by collaborative nurse to [facility] Prior Authorization Department for ordering provider Dr. Curly Rim.   See 10-05-2016 phone encounter for 1705.

## 2016-10-06 NOTE — Telephone Encounter (Signed)
Spoke with patient and got appts rescheduled.

## 2016-10-07 ENCOUNTER — Other Ambulatory Visit: Payer: Self-pay

## 2016-10-07 ENCOUNTER — Telehealth: Payer: Self-pay

## 2016-10-07 MED ORDER — POTASSIUM CHLORIDE CRYS ER 20 MEQ PO TBCR: 20.0000 meq | EXTENDED_RELEASE_TABLET | Freq: Two times a day (BID) | ORAL | 0 refills | Status: DC

## 2016-10-07 NOTE — Telephone Encounter (Signed)
Called and spoke with pt to address some questions and concerns. Discussed all previous questions with Dr.Gudena and told pt the following...  Ok to discontinue picc line. Contacted [facility] Alexis,RN and will come in next Tuesday to discontinue picc line. No further home care needs at this time.  Pt started on Ibrance and was instructed to hydrate, check temperature and signs of neutropenia. Instructed to call with any other symptoms or side effects.  Per Dr.Gudea, continue taking lasix 20mg  daily. Sent a new script to [facility]. Pt to pick up new script today.  Movantik medication still undergoing peer to peer review and may take a few days to resolve. Pt understanding about situation. Will update with more information in the next coming days.  Regarding pt return to work, Dr.Gudena feels that it is too early to do any traveling with work at this time.  Pt request for adderral medication refill, suggested that pt seeks assistance with her primary physician for this. Pt verbalized understanding.  Pt satisfied with call and all questions concerns were answered with this encounter. No other needs at this time.Marland KitchenMarland Kitchen

## 2016-10-07 NOTE — Telephone Encounter (Signed)
Pt requested a return call. 1) pt has a picc line. No longer taking antibiotic.Was placed in hospital. Not seeing other doctors at this time.  Does she need to keep picc? 2) started Ibrance last night. She is a little lightheaded, very mild. Discussed adequate hydration and getting up slowly 3) pt was having ascites, they took the drain out. She was having swelling in legs and feet. Lasix did help this swelling, legs and feet now back to normal. Should she continue the lasix?  Or use it PRN?  4) Phone note about Movantik 9/12. Will Dr Lindi Adie do peer to peer for the [place]? 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 The company wants her to do some training. No physical aspects. She needs a letter from Dr Lindi Adie to go back to work in that [employer] training.  6)She needs a renewal on her adderall. Her Adderall rx was by [facility] in [place]. It was for 10mg , 1/2 in am and 1/2 in pm. #30, no refills. It was done 07/27/16. Will Dr Lindi Adie prescribe this?  Please call pt with Dr Geralyn Flash responses.

## 2016-10-08 ENCOUNTER — Telehealth: Payer: Self-pay

## 2016-10-08 ENCOUNTER — Other Ambulatory Visit: Payer: Self-pay

## 2016-10-08 LAB — CULTURE, BLOOD (ROUTINE X 2)
CULTURE: NO GROWTH
Culture: NO GROWTH
Special Requests: ADEQUATE
Special Requests: ADEQUATE

## 2016-10-08 NOTE — Telephone Encounter (Signed)
Pt requesting for return to work letter from [employer] to be done today d/t urgent need at her work. Obtained approval from MD and will send it through pt email as requested today.

## 2016-10-08 NOTE — Telephone Encounter (Signed)
Ms Grabel called requesting letter from PMT MD Dr Rowe Pavy stating that she is cleared to work. Noted call to Dr Lindi Adie yesterday that states pt is NOT cleared for travel. Recommended that she call primary, as PMT cannot provide that letter.   Marjie Skiff Rudolph Dobler, RN, BSN, [facility] 10/08/2016 9:24 AM Cell [phone] 8:00-4:00 Monday-Friday Office [phone]

## 2016-10-12 ENCOUNTER — Ambulatory Visit: Payer: BLUE CROSS/BLUE SHIELD

## 2016-10-12 MED ORDER — PALBOCICLIB 100 MG PO CAPS: 100.0000 mg | ORAL_CAPSULE | Freq: Every day | ORAL | 0 refills | Status: DC

## 2016-10-14 ENCOUNTER — Telehealth: Payer: Self-pay

## 2016-10-14 NOTE — Telephone Encounter (Signed)
Returned patients VM regarding approval for Movantik. I informed her that we faxed the PA on the 14th of Sept and will fax it again now.  Cyndia Bent RN

## 2016-10-15 ENCOUNTER — Other Ambulatory Visit: Payer: Self-pay

## 2016-10-15 ENCOUNTER — Telehealth: Payer: Self-pay

## 2016-10-15 MED ORDER — FENTANYL 75 MCG/HR TD PT72: 75.0000 ug | MEDICATED_PATCH | TRANSDERMAL | 0 refills | Status: DC

## 2016-10-15 MED FILL — fentaNYL 75 MCG/HR PT72: 75 | 30 days supply | Qty: 10 | Fill #0

## 2016-10-15 NOTE — Telephone Encounter (Addendum)
Pt calling to see if she can have another print out of her 51mcg fentanyl patch. Pt states that due to insurance, pt was only allowed to fill 10 patches of 198mcg fentanyl. She was ordered 181mcg q72hrs. (155mcg and 42mcg patches- 10 each).   Pt states that she is currently taking more than prescribed (280mcg q72hrs- 2patches) because she was unable to have her pharmacy fill the 58mcg patches. Pt mom accidentally threw her 29mcg pain patch script away. Pt would like to request for Dr.Gudena to reprint the 32mcg patches pain patches so she can get them refilled today.  Told pt that this RN will need to discuss with Dr.Gudena and obtain his approval. Pt is not due for 179mcg patch refill until October.   12p- Dr.Gudena is now out of the office. Discussed with Lindsey,NP regarding pt situation and was able to make sure 11mcg patches were not filled in any pharmacy. Will issue pt a reprint of her 56mcg fentanyl patch today. Called and spoke with pt. Her mom will be picking up pain script today.

## 2016-10-19 ENCOUNTER — Ambulatory Visit: Payer: BLUE CROSS/BLUE SHIELD

## 2016-10-19 ENCOUNTER — Other Ambulatory Visit: Payer: BLUE CROSS/BLUE SHIELD

## 2016-10-22 ENCOUNTER — Other Ambulatory Visit: Payer: Self-pay

## 2016-10-22 ENCOUNTER — Telehealth: Payer: Self-pay | Admitting: Hematology and Oncology

## 2016-10-22 MED ORDER — ONDANSETRON HCL 8 MG PO TABS: 8.0000 mg | ORAL_TABLET | Freq: Three times a day (TID) | ORAL | 0 refills | Status: DC | PRN

## 2016-10-22 MED ORDER — POTASSIUM CHLORIDE CRYS ER 20 MEQ PO TBCR: 20.0000 meq | EXTENDED_RELEASE_TABLET | Freq: Two times a day (BID) | ORAL | 0 refills | Status: DC

## 2016-10-22 MED ORDER — PANTOPRAZOLE SODIUM 40 MG PO TBEC: 40.0000 mg | DELAYED_RELEASE_TABLET | Freq: Every day | ORAL | 0 refills | Status: DC

## 2016-10-22 MED ORDER — FUROSEMIDE 40 MG PO TABS: 40.0000 mg | ORAL_TABLET | Freq: Every day | ORAL | 0 refills | Status: DC

## 2016-10-22 NOTE — Telephone Encounter (Signed)
Left message for patient regarding the changes in her schedule on 10/1

## 2016-10-25 ENCOUNTER — Ambulatory Visit (HOSPITAL_BASED_OUTPATIENT_CLINIC_OR_DEPARTMENT_OTHER): Payer: BLUE CROSS/BLUE SHIELD

## 2016-10-25 ENCOUNTER — Ambulatory Visit: Payer: BLUE CROSS/BLUE SHIELD

## 2016-10-25 ENCOUNTER — Other Ambulatory Visit (HOSPITAL_BASED_OUTPATIENT_CLINIC_OR_DEPARTMENT_OTHER): Payer: BLUE CROSS/BLUE SHIELD

## 2016-10-25 ENCOUNTER — Telehealth: Payer: Self-pay | Admitting: Hematology and Oncology

## 2016-10-25 ENCOUNTER — Telehealth: Payer: Self-pay | Admitting: Pharmacist

## 2016-10-25 ENCOUNTER — Ambulatory Visit (HOSPITAL_BASED_OUTPATIENT_CLINIC_OR_DEPARTMENT_OTHER): Payer: BLUE CROSS/BLUE SHIELD | Admitting: Hematology and Oncology

## 2016-10-25 ENCOUNTER — Other Ambulatory Visit: Payer: Self-pay

## 2016-10-25 VITALS — BP 116/73 | HR 94 | Temp 99.7°F | Resp 20 | Ht 64.0 in | Wt 131.6 lb

## 2016-10-25 DIAGNOSIS — R180 Malignant ascites: Secondary | ICD-10-CM

## 2016-10-25 DIAGNOSIS — C7951 Secondary malignant neoplasm of bone: Secondary | ICD-10-CM

## 2016-10-25 DIAGNOSIS — C50919 Malignant neoplasm of unspecified site of unspecified female breast: Secondary | ICD-10-CM

## 2016-10-25 DIAGNOSIS — F329 Major depressive disorder, single episode, unspecified: Secondary | ICD-10-CM

## 2016-10-25 DIAGNOSIS — C50512 Malignant neoplasm of lower-outer quadrant of left female breast: Secondary | ICD-10-CM

## 2016-10-25 DIAGNOSIS — D61818 Other pancytopenia: Secondary | ICD-10-CM

## 2016-10-25 DIAGNOSIS — C7900 Secondary malignant neoplasm of unspecified kidney and renal pelvis: Secondary | ICD-10-CM

## 2016-10-25 DIAGNOSIS — C786 Secondary malignant neoplasm of retroperitoneum and peritoneum: Secondary | ICD-10-CM

## 2016-10-25 DIAGNOSIS — Z5111 Encounter for antineoplastic chemotherapy: Secondary | ICD-10-CM | POA: Diagnosis not present

## 2016-10-25 DIAGNOSIS — Z170 Estrogen receptor positive status [ER+]: Principal | ICD-10-CM

## 2016-10-25 DIAGNOSIS — F32A Depression, unspecified: Secondary | ICD-10-CM

## 2016-10-25 LAB — CBC WITH DIFFERENTIAL/PLATELET
BASO%: 0.7 % (ref 0.0–2.0)
BASOS ABS: 0 10*3/uL (ref 0.0–0.1)
EOS%: 0.4 % (ref 0.0–7.0)
Eosinophils Absolute: 0 10*3/uL (ref 0.0–0.5)
HCT: 27.1 % — ABNORMAL LOW (ref 34.8–46.6)
HGB: 9 g/dL — ABNORMAL LOW (ref 11.6–15.9)
LYMPH%: 27.8 % (ref 14.0–49.7)
MCH: 29.9 pg (ref 25.1–34.0)
MCHC: 33.3 g/dL (ref 31.5–36.0)
MCV: 89.8 fL (ref 79.5–101.0)
MONO#: 0.1 10*3/uL (ref 0.1–0.9)
MONO%: 4.8 % (ref 0.0–14.0)
NEUT#: 2 10*3/uL (ref 1.5–6.5)
NEUT%: 66.3 % (ref 38.4–76.8)
Platelets: 155 10*3/uL (ref 145–400)
RBC: 3.02 10*6/uL — AB (ref 3.70–5.45)
RDW: 16.1 % — AB (ref 11.2–14.5)
WBC: 3 10*3/uL — ABNORMAL LOW (ref 3.9–10.3)
lymph#: 0.8 10*3/uL — ABNORMAL LOW (ref 0.9–3.3)

## 2016-10-25 LAB — COMPREHENSIVE METABOLIC PANEL
ALT: 20 U/L (ref 0–55)
AST: 24 U/L (ref 5–34)
Albumin: 2.7 g/dL — ABNORMAL LOW (ref 3.5–5.0)
Alkaline Phosphatase: 87 U/L (ref 40–150)
Anion Gap: 9 mEq/L (ref 3–11)
BUN: 8.8 mg/dL (ref 7.0–26.0)
CHLORIDE: 103 meq/L (ref 98–109)
CO2: 26 mEq/L (ref 22–29)
Calcium: 9.2 mg/dL (ref 8.4–10.4)
Creatinine: 0.6 mg/dL (ref 0.6–1.1)
GLUCOSE: 88 mg/dL (ref 70–140)
POTASSIUM: 4.6 meq/L (ref 3.5–5.1)
SODIUM: 138 meq/L (ref 136–145)
Total Bilirubin: 0.24 mg/dL (ref 0.20–1.20)
Total Protein: 7.8 g/dL (ref 6.4–8.3)

## 2016-10-25 MED ORDER — HYDROMORPHONE HCL 4 MG PO TABS: 4.0000 mg | ORAL_TABLET | Freq: Four times a day (QID) | ORAL | 0 refills | Status: DC | PRN

## 2016-10-25 MED ORDER — DENOSUMAB 120 MG/1.7ML ~~LOC~~ SOLN
120.0000 mg | Freq: Once | SUBCUTANEOUS | Status: AC
  Administered 2016-10-25: 120 mg via SUBCUTANEOUS
  Filled 2016-10-25: qty 1.7

## 2016-10-25 MED ORDER — FULVESTRANT 250 MG/5ML IM SOLN
500.0000 mg | Freq: Once | INTRAMUSCULAR | Status: AC
  Administered 2016-10-25: 500 mg via INTRAMUSCULAR
  Filled 2016-10-25: qty 10

## 2016-10-25 MED ORDER — PALBOCICLIB 100 MG PO CAPS: 100.0000 mg | ORAL_CAPSULE | Freq: Every day | ORAL | 3 refills | Status: DC

## 2016-10-25 MED FILL — HYDROmorphone HCL 4 MG TABS: 4 | 15 days supply | Qty: 60 | Fill #0

## 2016-10-25 NOTE — Telephone Encounter (Signed)
Scheduled appt per 10/1 los. Patient did not want avs and calendar.

## 2016-10-25 NOTE — Patient Instructions (Signed)
Fulvestrant injection What is this medicine? FULVESTRANT (ful VES trant) blocks the effects of estrogen. It is used to treat breast cancer. This medicine may be used for other purposes; ask your health care provider or pharmacist if you have questions. COMMON BRAND NAME(S): FASLODEX What should I tell my health care provider before I take this medicine? They need to know if you have any of these conditions: -bleeding problems -liver disease -low levels of platelets in the blood -an unusual or allergic reaction to fulvestrant, other medicines, foods, dyes, or preservatives -pregnant or trying to get pregnant -breast-feeding How should I use this medicine? This medicine is for injection into a muscle. It is usually given by a health care professional in a hospital or clinic setting. Talk to your pediatrician regarding the use of this medicine in children. Special care may be needed. Overdosage: If you think you have taken too much of this medicine contact a poison control center or emergency room at once. NOTE: This medicine is only for you. Do not share this medicine with others. What if I miss a dose? It is important not to miss your dose. Call your doctor or health care professional if you are unable to keep an appointment. What may interact with this medicine? -medicines that treat or prevent blood clots like warfarin, enoxaparin, and dalteparin This list may not describe all possible interactions. Give your health care provider a list of all the medicines, herbs, non-prescription drugs, or dietary supplements you use. Also tell them if you smoke, drink alcohol, or use illegal drugs. Some items may interact with your medicine. What should I watch for while using this medicine? Your condition will be monitored carefully while you are receiving this medicine. You will need important blood work done while you are taking this medicine. Do not become pregnant while taking this medicine or for  at least 1 year after stopping it. Women of child-bearing potential will need to have a negative pregnancy test before starting this medicine. Women should inform their doctor if they wish to become pregnant or think they might be pregnant. There is a potential for serious side effects to an unborn child. Men should inform their doctors if they wish to father a child. This medicine may lower sperm counts. Talk to your health care professional or pharmacist for more information. Do not breast-feed an infant while taking this medicine or for 1 year after the last dose. What side effects may I notice from receiving this medicine? Side effects that you should report to your doctor or health care professional as soon as possible: -allergic reactions like skin rash, itching or hives, swelling of the face, lips, or tongue -feeling faint or lightheaded, falls -pain, tingling, numbness, or weakness in the legs -signs and symptoms of infection like fever or chills; cough; flu-like symptoms; sore throat -vaginal bleeding Side effects that usually do not require medical attention (report to your doctor or health care professional if they continue or are bothersome): -aches, pains -constipation -diarrhea -headache -hot flashes -nausea, vomiting -pain at site where injected -stomach pain This list may not describe all possible side effects. Call your doctor for medical advice about side effects. You may report side effects to FDA at 1-800-FDA-1088. Where should I keep my medicine? This drug is given in a hospital or clinic and will not be stored at home. NOTE: This sheet is a summary. It may not cover all possible information. If you have questions about this medicine, talk to your   doctor, pharmacist, or health care provider.  2018 Elsevier/Gold Standard (2014-08-09 11:03:55) Denosumab injection What is this medicine? DENOSUMAB (den oh sue mab) slows bone breakdown. Prolia is used to treat osteoporosis in  women after menopause and in men. Delton See is used to treat a high calcium level due to cancer and to prevent bone fractures and other bone problems caused by multiple myeloma or cancer bone metastases. Delton See is also used to treat giant cell tumor of the bone. This medicine may be used for other purposes; ask your health care provider or pharmacist if you have questions. COMMON BRAND NAME(S): Prolia, XGEVA What should I tell my health care provider before I take this medicine? They need to know if you have any of these conditions: -dental disease -having surgery or tooth extraction -infection -kidney disease -low levels of calcium or Vitamin D in the blood -malnutrition -on hemodialysis -skin conditions or sensitivity -thyroid or parathyroid disease -an unusual reaction to denosumab, other medicines, foods, dyes, or preservatives -pregnant or trying to get pregnant -breast-feeding How should I use this medicine? This medicine is for injection under the skin. It is given by a health care professional in a hospital or clinic setting. If you are getting Prolia, a special MedGuide will be given to you by the pharmacist with each prescription and refill. Be sure to read this information carefully each time. For Prolia, talk to your pediatrician regarding the use of this medicine in children. Special care may be needed. For Delton See, talk to your pediatrician regarding the use of this medicine in children. While this drug may be prescribed for children as young as 13 years for selected conditions, precautions do apply. Overdosage: If you think you have taken too much of this medicine contact a poison control center or emergency room at once. NOTE: This medicine is only for you. Do not share this medicine with others. What if I miss a dose? It is important not to miss your dose. Call your doctor or health care professional if you are unable to keep an appointment. What may interact with this  medicine? Do not take this medicine with any of the following medications: -other medicines containing denosumab This medicine may also interact with the following medications: -medicines that lower your chance of fighting infection -steroid medicines like prednisone or cortisone This list may not describe all possible interactions. Give your health care provider a list of all the medicines, herbs, non-prescription drugs, or dietary supplements you use. Also tell them if you smoke, drink alcohol, or use illegal drugs. Some items may interact with your medicine. What should I watch for while using this medicine? Visit your doctor or health care professional for regular checks on your progress. Your doctor or health care professional may order blood tests and other tests to see how you are doing. Call your doctor or health care professional for advice if you get a fever, chills or sore throat, or other symptoms of a cold or flu. Do not treat yourself. This drug may decrease your body's ability to fight infection. Try to avoid being around people who are sick. You should make sure you get enough calcium and vitamin D while you are taking this medicine, unless your doctor tells you not to. Discuss the foods you eat and the vitamins you take with your health care professional. See your dentist regularly. Brush and floss your teeth as directed. Before you have any dental work done, tell your dentist you are receiving this medicine. Do  not become pregnant while taking this medicine or for 5 months after stopping it. Talk with your doctor or health care professional about your birth control options while taking this medicine. Women should inform their doctor if they wish to become pregnant or think they might be pregnant. There is a potential for serious side effects to an unborn child. Talk to your health care professional or pharmacist for more information. What side effects may I notice from receiving this  medicine? Side effects that you should report to your doctor or health care professional as soon as possible: -allergic reactions like skin rash, itching or hives, swelling of the face, lips, or tongue -bone pain -breathing problems -dizziness -jaw pain, especially after dental work -redness, blistering, peeling of the skin -signs and symptoms of infection like fever or chills; cough; sore throat; pain or trouble passing urine -signs of low calcium like fast heartbeat, muscle cramps or muscle pain; pain, tingling, numbness in the hands or feet; seizures -unusual bleeding or bruising -unusually weak or tired Side effects that usually do not require medical attention (report to your doctor or health care professional if they continue or are bothersome): -constipation -diarrhea -headache -joint pain -loss of appetite -muscle pain -runny nose -tiredness -upset stomach This list may not describe all possible side effects. Call your doctor for medical advice about side effects. You may report side effects to FDA at 1-800-FDA-1088. Where should I keep my medicine? This medicine is only given in a clinic, doctor's office, or other health care setting and will not be stored at home. NOTE: This sheet is a summary. It may not cover all possible information. If you have questions about this medicine, talk to your doctor, pharmacist, or health care provider.  2018 Elsevier/Gold Standard (2016-02-03 19:17:21)  

## 2016-10-25 NOTE — Telephone Encounter (Signed)
Oral Oncology Pharmacist Encounter  Patient in office today for follow-up. CBC check shows stable ANC and recovery of pancytopenia. Ibrance 100mg  [facility] per insurance requirement.  Medication assessed for DDIs, category C interactions with Fentanyl and Naloxegol identified. Leslee Home is a major substrate of and inhibitor of CYP3A4 and may lead to increased systemic exposure to both the Fentanyl and Neloxegol. No change to current therapy indicated at this time.  Oral Oncology Clinic will continue to follow for insurance authorization or copayment needs.  Johny Drilling, PharmD, BCPS, BCOP 10/25/2016 10:17 AM Oral Oncology Clinic [phone]

## 2016-10-25 NOTE — Progress Notes (Signed)
Patient Care Team: Olena Mater, MD as PCP - General (Internal Medicine) Elveria Rising, MD (Obstetrics and Gynecology)  DIAGNOSIS:  Encounter Diagnosis  Name Primary?  . Metastatic breast cancer ([place]) Yes    SUMMARY OF ONCOLOGIC HISTORY:   Metastatic breast cancer ([place])   01/21/2014 Initial Diagnosis    Right breast cancer status post mastectomy and axillary lymph node dissection multifocal disease 4.4 cm, 1.1 cm, 1 mm, ER 93%, PR 40-50%, HER-2 negative ratio 1.3, T2 N3 a stage IIIc diagnosed at [facility] by Dr.Katragadda; treated with adjuvant chemotherapy with before meals 4 followed by Taxotere 12 followed by radiation and tamoxifen      07/15/2016 Relapse/Recurrence    Patient presented with abdominal distention and ascites; CT 07/15/2016 revealed ascites, pleural effusion, pericardial masses, 8 mm right liver nodule, adrenal nodule; biopsy metastatic breast cancer ER/PR positive HER-2 negative; MRI pelvis 07/15/2016 pelvic mass is 5.27 minutes, 5.6 cm, right adrenal nodule 4.2 cm      07/23/2016 Miscellaneous    Paracentesis 1.7 L cytology negative, peritoneal catheter, patient claims that once or twice a week      07/26/2016 PET scan    Pelvic masses, intrarenal mass, portal caval lymph node, anterior mediastinal lymph node, several bone lesions T8 compression fracture and right femur concern for impending fracture, right pleural effusion, ascites      08/08/2016 -  Anti-estrogen oral therapy    Faslodex (loading doses given on 08/08/2016 and 08/23/2016) Palbociclib (started 08/23/2016) and Xgeva (last injection 08/12/2016)      08/10/2016 - 08/24/2016 Radiation Therapy    Radiation therapy to T8 and bilateral femurs (complication: Esophagitis): Radiation done in [place] ([facility])      09/09/2016 - 09/16/2016 Hospital Admission    Hospitalization for infected loculated ascites (MSSA SBP), pancytopenia       CHIEF COMPLIANT: Follow-up on  metastatic breast cancer on Faslodex [place]  INTERVAL HISTORY: Tamara Byrd is a 51 year old with above-mentioned symptoms metastatic breast cancer currently on Ibrance along with Faslodex and Xgeva. We reduced the dosage of Leslee Home is here to review her labs. She does with her the abdomen appears to be doing quite well. Her leg swelling has resolved and she does not want to take Lasix anymore or for that matter potassium either. Her pain is under better control. She is currently using 150 g of fentanyl patch along with Dilaudid as needed. She has not required much Dilaudid. However she still thinks she needs to fentanyl patch because without it she has lots of penis and pain. Her weight is slightly better. She does have nausea several times in the day. She takes Zofran as needed.  REVIEW OF SYSTEMS:   Constitutional: Denies fevers, chills or abnormal weight loss Eyes: Denies blurriness of vision Ears, nose, mouth, throat, and face: Denies mucositis or sore throat Respiratory: Denies cough, dyspnea or wheezes Cardiovascular: Denies palpitation, chest discomfort Gastrointestinal:  Complains of nausea Skin: Denies abnormal skin rashes Lymphatics: Denies new lymphadenopathy or easy bruising Neurological:Denies numbness, tingling or new weaknesses Behavioral/Psych: Mood is stable, no new changes  Extremities: No lower extremity edema All other systems were reviewed with the patient and are negative.  I have reviewed the past medical history, past surgical history, social history and family history with the patient and they are unchanged from previous note.  ALLERGIES:  has No Known Allergies.  MEDICATIONS:  Current Outpatient Prescriptions  Medication Sig Dispense Refill  . buPROPion (WELLBUTRIN XL) 150 MG 24 hr tablet  Take 150 mg by mouth daily.    . Calcium Carbonate-Vit D-Min (CALCIUM 1200) 1200-1000 MG-UNIT CHEW Chew 1 tablet by mouth daily. 30 each   . diphenhydrAMINE (BENADRYL)  25 mg capsule Take 1 capsule (25 mg total) by mouth at bedtime as needed for sleep.    . fentaNYL (DURAGESIC - DOSED MCG/HR) 75 MCG/HR Place 1 patch (75 mcg total) onto the skin every 3 (three) days. 10 patch 0  . HYDROmorphone (DILAUDID) 4 MG tablet Take 1 tablet (4 mg total) by mouth every 6 (six) hours as needed for moderate pain or severe pain. 60 tablet 0  . LORazepam (ATIVAN) 0.5 MG tablet Take 1 tablet (0.5 mg total) by mouth 2 (two) times daily as needed (anxiety or sleep). 20 tablet 0  . naloxegol oxalate (MOVANTIK) 12.5 MG TABS tablet Take 1 tablet (12.5 mg total) by mouth daily. 30 tablet 0  . ondansetron (ZOFRAN) 8 MG tablet Take 1 tablet (8 mg total) by mouth every 8 (eight) hours as needed for nausea or vomiting. 90 tablet 0  . palbociclib (IBRANCE) 100 MG capsule Take 1 capsule (100 mg total) by mouth daily with breakfast. Take whole with food. Take for 3 weeks on, 1 week off. 21 capsule 3  . polyethylene glycol (MIRALAX / GLYCOLAX) packet Take 17 g by mouth 3 (three) times daily. 90 each 0   No current facility-administered medications for this visit.     PHYSICAL EXAMINATION: ECOG PERFORMANCE STATUS: 1 - Symptomatic but completely ambulatory  Vitals:   10/25/16 1017  BP: 116/73  Pulse: 94  Resp: 20  Temp: 99.7 F (37.6 C)  SpO2: 100%   Filed Weights   10/25/16 1017  Weight: 131 lb 9.6 oz (59.7 kg)    GENERAL:alert, no distress and comfortable SKIN: skin color, texture, turgor are normal, no rashes or significant lesions EYES: normal, Conjunctiva are pink and non-injected, sclera clear OROPHARYNX:no exudate, no erythema and lips, buccal mucosa, and tongue normal  NECK: supple, thyroid normal size, non-tender, without nodularity LYMPH:  no palpable lymphadenopathy in the cervical, axillary or inguinal LUNGS: clear to auscultation and percussion with normal breathing effort HEART: regular rate & rhythm and no murmurs and no lower extremity edema ABDOMEN:abdomen  soft, non-tender and normal bowel sounds MUSCULOSKELETAL:no cyanosis of digits and no clubbing  NEURO: alert & oriented x 3 with fluent speech, no focal motor/sensory deficits EXTREMITIES: No lower extremity edema  LABORATORY DATA:  I have reviewed the data as listed   Chemistry      Component Value Date/Time   NA 138 10/25/2016 0950   K 4.6 10/25/2016 0950   CL 100 (L) 10/04/2016 0430   CO2 26 10/25/2016 0950   BUN 8.8 10/25/2016 0950   CREATININE 0.6 10/25/2016 0950      Component Value Date/Time   CALCIUM 9.2 10/25/2016 0950   ALKPHOS 87 10/25/2016 0950   AST 24 10/25/2016 0950   ALT 20 10/25/2016 0950   BILITOT 0.24 10/25/2016 0950       Lab Results  Component Value Date   WBC 3.0 (L) 10/25/2016   HGB 9.0 (L) 10/25/2016   HCT 27.1 (L) 10/25/2016   MCV 89.8 10/25/2016   PLT 155 10/25/2016   NEUTROABS 2.0 10/25/2016    ASSESSMENT & PLAN:  Metastatic breast cancer ([place]) Metastatic breast cancer with bone, lymph nodes, liver, pleural effusion, ascites, pelvic masses and intrarenal metastases diagnosed 07/15/2016 (Prior history of stage IIIc breast cancer December 2015 treated with mastectomy, adjuvant  chemotherapy, radiation and tamoxifen)  Current treatment: Palbociclib with Faslodex and Xgeva Hospitalization for MSSA spontaneous bacterial peritonitis 09/09/2016- 09/16/2016  Plan: 1. Palbociclib 100 mg 3 weeks on one week off. Will administer Faslodex and Xgeva today. 2. Bone metastases: Xgeva once a month along with calcium and vitamin D.  Pain from bone metastases: Currently on fentanyl patch 75 g every 72 hours along with Dilaudid. Nausea due to Ibrance: I instructed her to take Zofran 30 minutes before taking Ibrance. Severe lower extremity edema: Resolved so we will discontinue Lasix and potassium  Severe pancytopenia: Much improved with 100 mg of Ibrance. Sent this dose to her specialty pharmacy.  Major depression and anxiety: Patient is currently on  Wellbutrin and Ativan. She tells me that it is not working for her. She would like to see a psychiatrist. We will send a psychiatric referral. Return to clinic every 4 weeks for labs and follow-up. Our plan is to obtain scans every 3 months.   I spent 25 minutes talking to the patient of which more than half was spent in counseling and coordination of care.  Orders Placed This Encounter  Procedures  . CBC with Differential    Standing Status:   Future    Standing Expiration Date:   10/25/2017  . Comprehensive metabolic panel    Standing Status:   Future    Standing Expiration Date:   10/25/2017   The patient has a good understanding of the overall plan. she agrees with it. she will call with any problems that may develop before the next visit here.   Rulon Eisenmenger, MD 10/25/16

## 2016-10-25 NOTE — Assessment & Plan Note (Signed)
Metastatic breast cancer with bone, lymph nodes, liver, pleural effusion, ascites, pelvic masses and intrarenal metastases diagnosed 07/15/2016 (Prior history of stage IIIc breast cancer December 2015 treated with mastectomy, adjuvant chemotherapy, radiation and tamoxifen)  Current treatment: Palbociclib with Faslodex and Xgeva Hospitalization for MSSA spontaneous bacterial peritonitis 09/09/2016- 09/16/2016  Plan: 1. Palbociclib 100 mg 3 weeks on one week off. Will administer Faslodex and Xgeva today. 2. Bone metastases: Xgeva once a month along with calcium and vitamin D.  Pain from bone metastases: Currently on fentanyl patch 75 g every 72 hours along with Dilaudid.  Severe lower extremity edema: Resolved so we will discontinue Lasix and potassium  Severe pancytopenia: Much improved with 100 mg of Ibrance. Sent this dose to her specialty pharmacy.  Major depression and anxiety: Patient is currently on Wellbutrin and Ativan. She tells me that it is not working for her. She would like to see a psychiatrist. We will send a psychiatric referral. Return to clinic every 4 weeks for labs and follow-up. Our plan is to obtain scans every 3 months.

## 2016-10-25 NOTE — Telephone Encounter (Signed)
Left message for patient regarding added injection appts as well as her appt with Dr.Arfeen

## 2016-10-26 ENCOUNTER — Telehealth: Payer: Self-pay | Admitting: Hematology and Oncology

## 2016-10-26 ENCOUNTER — Ambulatory Visit: Payer: BLUE CROSS/BLUE SHIELD

## 2016-10-26 NOTE — Telephone Encounter (Signed)
Spoke with patient regarding the referral for Dr.Arfeen and her injection appts.

## 2016-10-29 NOTE — Telephone Encounter (Signed)
Oral Oncology Pharmacist Encounter  I called [facility] call center at [phone] to follow-up on Ibrance prescription e-scribed on 10/25/16. Prescription still waiting to be verified by a pharmacist. They were able to review Rx with me over the phone and get it verified. They requested I call patient to have her call in to schedule shipment.  I called and LVM for patient with instructions that Rx was at [place] per insurance requirement.  [facility] 4pm (30 min after Rx was verified) to schedule shipment. [facility] instructed [facility] call [facility] at [phone].  Johny Drilling, PharmD, BCPS, BCOP 10/29/2016 3:36 PM Oral Oncology Clinic [phone]

## 2016-11-01 ENCOUNTER — Other Ambulatory Visit: Payer: Self-pay

## 2016-11-01 DIAGNOSIS — C50919 Malignant neoplasm of unspecified site of unspecified female breast: Secondary | ICD-10-CM

## 2016-11-01 MED ORDER — FENTANYL 25 MCG/HR TD PT72
25.0000 ug | MEDICATED_PATCH | TRANSDERMAL | 0 refills | Status: DC
Start: 2016-11-01 — End: 2016-11-23

## 2016-11-01 MED ORDER — FENTANYL 100 MCG/HR TD PT72: 100.0000 ug | MEDICATED_PATCH | TRANSDERMAL | 0 refills | Status: DC

## 2016-11-01 MED ORDER — FENTANYL 25 MCG/HR TD PT72: 25.0000 ug | MEDICATED_PATCH | TRANSDERMAL | 0 refills | Status: DC

## 2016-11-01 MED FILL — fentaNYL 100 MCG/HR PT72: 100 | 15 days supply | Qty: 5 | Fill #0

## 2016-11-01 MED FILL — fentaNYL 25 MCG/HR PT72: 25 | 15 days supply | Qty: 5 | Fill #0

## 2016-11-01 NOTE — Telephone Encounter (Signed)
Pt states that she is out of fentanyl patch 66mcg. Pt reported to Dr.Gudena, during her last visit, that she was taking 121mcg every 3 days. Dr.Gudena told pt to cut down to just 37mcg q3days, but pt finds more frequent use of her dilaudid p.o. to help with pain. Pt does not want to take too much of dilaudid, and would like to see if Dr.Gudena could taper pt dose off fentanyl slowly. Requesting if she can take 179mcg every 3 days for a few weeks until she reaches tolerating pain with 34mcg. Discussed with Dr. Lindi Adie and he is okay to change pt dosing to 124mcg q3days. Will write 2 scripts (162mcg + 23mcg=125mcg) Dispense 1 box each that contains 5 patches (should last patient for 15 days). Pt verbalized understanding and will come and pick her prescription up today at the office.

## 2016-11-04 ENCOUNTER — Other Ambulatory Visit: Payer: Self-pay | Admitting: Hematology and Oncology

## 2016-11-04 ENCOUNTER — Encounter: Payer: Self-pay | Admitting: Hematology and Oncology

## 2016-11-04 MED ORDER — NALOXEGOL OXALATE 12.5 MG PO TABS: 12.5000 mg | ORAL_TABLET | Freq: Every day | ORAL | 3 refills | Status: DC

## 2016-11-04 NOTE — Progress Notes (Signed)
Received rejection for Movantik from pharmacy.  Reviewed chart and did not see rx written by physician. Obtained one from him created in [place].  Called [employer](Evangela) to initiate PA. She states one was already submitted by May and denied on 10/08/16. She faxed denial and states new information would need to be submitted to have reviewed and a redetermination.  Left information with a note at RN desk for May.

## 2016-11-12 ENCOUNTER — Encounter: Payer: Self-pay | Admitting: General Practice

## 2016-11-12 NOTE — Progress Notes (Signed)
[facility] Spiritual Care Note  Received and returned Tamara Byrd's call to request appt for emotional reflection, processing, and support.  We plan to meet Monday 10/22 at 9:00am in my office.   [place], [place], [facility] Pager [phone] Voicemail [phone]

## 2016-11-15 ENCOUNTER — Encounter: Payer: Self-pay | Admitting: General Practice

## 2016-11-15 NOTE — Progress Notes (Signed)
[facility] Spiritual Care Note  Met with Shakeita as planned.  Per pt, she is struggling with "making peace with God" as a core task in facing her terminal cancer.  She sought a professional conversation partner for assistance with theological reflection, scriptural interpretation, and faith-based interventions (spiritual practices) to address her questions and religious/spiritual distress.  Provided pastoral presence, empathic listening, theological reflection and questions for further reflection/experiential noticing, and suggested spiritual practices such as the [place] Examen (examining one's consolations/desolations [presence/absence of God] each day) as a means for continuing her seeking as part of a dynamic spiritual relationship (rather than just one-sided rational thoughts).  Dymphna found the session very helpful, is actively seeking an Network engineer, is utilizing her free massages, plans to attend Living with Cancer Support Group, plans to seek a counseling referral at her upcoming psychiatric appt, and knows to contact Support Team any time with needs/questions/interests.   [place], [place], [facility] Pager [phone] Voicemail [phone]

## 2016-11-16 ENCOUNTER — Other Ambulatory Visit: Payer: Self-pay | Admitting: Hematology and Oncology

## 2016-11-16 MED ORDER — ONDANSETRON HCL 8 MG PO TABS: 8.0000 mg | ORAL_TABLET | Freq: Three times a day (TID) | ORAL | 0 refills | Status: DC | PRN

## 2016-11-16 MED ORDER — FENTANYL 100 MCG/HR TD PT72: 100.0000 ug | MEDICATED_PATCH | TRANSDERMAL | 0 refills | Status: DC

## 2016-11-16 MED ORDER — HYDROMORPHONE HCL 4 MG PO TABS: 4.0000 mg | ORAL_TABLET | Freq: Four times a day (QID) | ORAL | 0 refills | Status: DC | PRN

## 2016-11-22 ENCOUNTER — Other Ambulatory Visit (HOSPITAL_BASED_OUTPATIENT_CLINIC_OR_DEPARTMENT_OTHER): Payer: BLUE CROSS/BLUE SHIELD

## 2016-11-22 ENCOUNTER — Ambulatory Visit (HOSPITAL_BASED_OUTPATIENT_CLINIC_OR_DEPARTMENT_OTHER): Payer: BLUE CROSS/BLUE SHIELD | Admitting: Hematology and Oncology

## 2016-11-22 ENCOUNTER — Ambulatory Visit (HOSPITAL_BASED_OUTPATIENT_CLINIC_OR_DEPARTMENT_OTHER): Payer: BLUE CROSS/BLUE SHIELD

## 2016-11-22 ENCOUNTER — Telehealth: Payer: Self-pay | Admitting: Hematology and Oncology

## 2016-11-22 VITALS — BP 118/73 | HR 95 | Temp 98.2°F | Resp 18 | Ht 64.0 in | Wt 131.7 lb

## 2016-11-22 DIAGNOSIS — C7951 Secondary malignant neoplasm of bone: Secondary | ICD-10-CM

## 2016-11-22 DIAGNOSIS — F418 Other specified anxiety disorders: Secondary | ICD-10-CM

## 2016-11-22 DIAGNOSIS — J910 Malignant pleural effusion: Secondary | ICD-10-CM

## 2016-11-22 DIAGNOSIS — C786 Secondary malignant neoplasm of retroperitoneum and peritoneum: Secondary | ICD-10-CM

## 2016-11-22 DIAGNOSIS — C50919 Malignant neoplasm of unspecified site of unspecified female breast: Secondary | ICD-10-CM

## 2016-11-22 DIAGNOSIS — C7900 Secondary malignant neoplasm of unspecified kidney and renal pelvis: Secondary | ICD-10-CM

## 2016-11-22 DIAGNOSIS — Z5111 Encounter for antineoplastic chemotherapy: Secondary | ICD-10-CM

## 2016-11-22 DIAGNOSIS — D61818 Other pancytopenia: Secondary | ICD-10-CM | POA: Diagnosis not present

## 2016-11-22 DIAGNOSIS — Z170 Estrogen receptor positive status [ER+]: Principal | ICD-10-CM

## 2016-11-22 DIAGNOSIS — C50512 Malignant neoplasm of lower-outer quadrant of left female breast: Secondary | ICD-10-CM

## 2016-11-22 DIAGNOSIS — R180 Malignant ascites: Secondary | ICD-10-CM

## 2016-11-22 DIAGNOSIS — D649 Anemia, unspecified: Secondary | ICD-10-CM

## 2016-11-22 DIAGNOSIS — G893 Neoplasm related pain (acute) (chronic): Secondary | ICD-10-CM

## 2016-11-22 LAB — COMPREHENSIVE METABOLIC PANEL
ALT: 8 U/L (ref 0–55)
ANION GAP: 8 meq/L (ref 3–11)
AST: 14 U/L (ref 5–34)
Albumin: 3.2 g/dL — ABNORMAL LOW (ref 3.5–5.0)
Alkaline Phosphatase: 66 U/L (ref 40–150)
BUN: 8.8 mg/dL (ref 7.0–26.0)
CALCIUM: 9.1 mg/dL (ref 8.4–10.4)
CHLORIDE: 104 meq/L (ref 98–109)
CO2: 28 mEq/L (ref 22–29)
Creatinine: 0.7 mg/dL (ref 0.6–1.1)
EGFR: >60 mL/min/{1.73_m2} (ref >60)
Glucose: 79 mg/dl (ref 70–140)
Potassium: 4.4 mEq/L (ref 3.5–5.1)
Sodium: 140 mEq/L (ref 136–145)
Total Bilirubin: 0.24 mg/dL (ref 0.20–1.20)
Total Protein: 7.4 g/dL (ref 6.4–8.3)

## 2016-11-22 LAB — CBC WITH DIFFERENTIAL/PLATELET
BASO%: 1.1 % (ref 0.0–2.0)
BASOS ABS: 0 10*3/uL (ref 0.0–0.1)
EOS%: 0.7 % (ref 0.0–7.0)
Eosinophils Absolute: 0 10*3/uL (ref 0.0–0.5)
HEMATOCRIT: 30.4 % — AB (ref 34.8–46.6)
HGB: 9.7 g/dL — ABNORMAL LOW (ref 11.6–15.9)
LYMPH#: 0.8 10*3/uL — AB (ref 0.9–3.3)
LYMPH%: 30.9 % (ref 14.0–49.7)
MCH: 31.6 pg (ref 25.1–34.0)
MCHC: 31.9 g/dL (ref 31.5–36.0)
MCV: 99 fL (ref 79.5–101.0)
MONO#: 0.2 10*3/uL (ref 0.1–0.9)
MONO%: 7.1 % (ref 0.0–14.0)
NEUT#: 1.6 10*3/uL (ref 1.5–6.5)
NEUT%: 60.2 % (ref 38.4–76.8)
Platelets: 254 10*3/uL (ref 145–400)
RBC: 3.07 10*6/uL — ABNORMAL LOW (ref 3.70–5.45)
RDW: 19.4 % — ABNORMAL HIGH (ref 11.2–14.5)
WBC: 2.7 10*3/uL — ABNORMAL LOW (ref 3.9–10.3)

## 2016-11-22 MED ORDER — BUPROPION HCL ER (XL) 150 MG PO TB24
150.0000 mg | ORAL_TABLET | Freq: Every day | ORAL | 6 refills | Status: DC
Start: 2016-11-22 — End: 2017-02-04

## 2016-11-22 MED ORDER — POLYETHYLENE GLYCOL 3350 17 G PO PACK: 17.0000 g | PACK | Freq: Three times a day (TID) | ORAL | 6 refills | Status: DC

## 2016-11-22 MED ORDER — FULVESTRANT 250 MG/5ML IM SOLN
500.0000 mg | Freq: Once | INTRAMUSCULAR | Status: AC
  Administered 2016-11-22: 500 mg via INTRAMUSCULAR
  Filled 2016-11-22: qty 10

## 2016-11-22 MED ORDER — DENOSUMAB 120 MG/1.7ML ~~LOC~~ SOLN
120.0000 mg | Freq: Once | SUBCUTANEOUS | Status: AC
  Administered 2016-11-22: 120 mg via SUBCUTANEOUS
  Filled 2016-11-22: qty 1.7

## 2016-11-22 MED ORDER — AMPHETAMINE-DEXTROAMPHETAMINE 10 MG PO TABS: 10.0000 mg | ORAL_TABLET | Freq: Every day | ORAL | 0 refills | Status: DC

## 2016-11-22 NOTE — Assessment & Plan Note (Signed)
Metastatic breast cancer with bone, lymph nodes, liver, pleural effusion, ascites, pelvic masses and intrarenal metastases diagnosed 07/15/2016 (Prior history of stage IIIc breast cancer December 2015 treated with mastectomy, adjuvant chemotherapy, radiation and tamoxifen)  Current treatment: Palbociclib with Faslodex and Xgeva Hospitalization for MSSA spontaneous bacterial peritonitis 09/09/2016- 09/16/2016  Plan: 1. Palbociclib 100 mg 3 weeks on one week off. Will administer Faslodex and Xgeva today. 2. Bone metastases: Xgeva once a month along with calcium and vitamin D.  Pain from bone metastases: Currently on fentanyl patch 75g every 72 hours along with Dilaudid. Nausea due to Ibrance: I instructed her to take Zofran 30 minutes before taking Ibrance. Severe lower extremity edema: Resolved so we will discontinue Lasix and potassium  Severe pancytopenia: Much improved with 100 mg of Ibrance. Sent this dose to her specialty pharmacy.  Major depression and anxiety: Patient is currently on Wellbutrin and Ativan. She tells me that it is not working for her. Psychiatric referral.  Return to clinic every 4 weeks for labs and follow-up.  Our plan is to obtain scans every 3 months which will be in December

## 2016-11-22 NOTE — Progress Notes (Signed)
Patient Care Team: Olena Mater, MD as PCP - General (Internal Medicine) Elveria Rising, MD (Obstetrics and Gynecology)  DIAGNOSIS:  Encounter Diagnoses  Name Primary?  . Bone metastases ([place]) Yes  . Metastatic breast cancer ([place])     SUMMARY OF ONCOLOGIC HISTORY:   Metastatic breast cancer ([place])   01/21/2014 Initial Diagnosis    Right breast cancer status post mastectomy and axillary lymph node dissection multifocal disease 4.4 cm, 1.1 cm, 1 mm, ER 93%, PR 40-50%, HER-2 negative ratio 1.3, T2 N3 a stage IIIc diagnosed at [facility] by Dr.Katragadda; treated with adjuvant chemotherapy with before meals 4 followed by Taxotere 12 followed by radiation and tamoxifen      07/15/2016 Relapse/Recurrence    Patient presented with abdominal distention and ascites; CT 07/15/2016 revealed ascites, pleural effusion, pericardial masses, 8 mm right liver nodule, adrenal nodule; biopsy metastatic breast cancer ER/PR positive HER-2 negative; MRI pelvis 07/15/2016 pelvic mass is 5.27 minutes, 5.6 cm, right adrenal nodule 4.2 cm      07/23/2016 Miscellaneous    Paracentesis 1.7 L cytology negative, peritoneal catheter, patient claims that once or twice a week      07/26/2016 PET scan    Pelvic masses, intrarenal mass, portal caval lymph node, anterior mediastinal lymph node, several bone lesions T8 compression fracture and right femur concern for impending fracture, right pleural effusion, ascites      08/08/2016 -  Anti-estrogen oral therapy    Faslodex (loading doses given on 08/08/2016 and 08/23/2016) Palbociclib (started 08/23/2016) and Xgeva (last injection 08/12/2016)      08/10/2016 - 08/24/2016 Radiation Therapy    Radiation therapy to T8 and bilateral femurs (complication: Esophagitis): Radiation done in [place] ([facility])      09/09/2016 - 09/16/2016 Hospital Admission    Hospitalization for infected loculated ascites (MSSA SBP), pancytopenia        CHIEF COMPLIANT: Follow-up of metastatic breast cancer on Palbociclib with Faslodex  INTERVAL HISTORY: Tamara Byrd is a 51 year old with above-mentioned history of metastatic breast cancer currently on palbociclib with Faslodex.  She is here for monthly injection visit.  She reports doing reasonably well.  She has not had any problems with worsening ascites.  She continues to have some moderate discomfort in the abdomen.  She does have fatigue.  REVIEW OF SYSTEMS:   Constitutional: Denies fevers, chills or abnormal weight loss Eyes: Denies blurriness of vision Ears, nose, mouth, throat, and face: Denies mucositis or sore throat Respiratory: Denies cough, dyspnea or wheezes Cardiovascular: Denies palpitation, chest discomfort Gastrointestinal: Moderate abdominal distention Skin: Denies abnormal skin rashes Lymphatics: Denies new lymphadenopathy or easy bruising Neurological:Denies numbness, tingling or new weaknesses Behavioral/Psych: Mood is stable, no new changes  Extremities: No lower extremity edema Breast:  denies any pain or lumps or nodules in either breasts All other systems were reviewed with the patient and are negative.  I have reviewed the past medical history, past surgical history, social history and family history with the patient and they are unchanged from previous note.  ALLERGIES:  has No Known Allergies.  MEDICATIONS:  Current Outpatient Prescriptions  Medication Sig Dispense Refill  . amphetamine-dextroamphetamine (ADDERALL) 10 MG tablet Take 1 tablet (10 mg total) by mouth daily with breakfast. 30 tablet 0  . buPROPion (WELLBUTRIN XL) 150 MG 24 hr tablet Take 1 tablet (150 mg total) by mouth daily. 30 tablet 6  . Calcium Carbonate-Vit D-Min (CALCIUM 1200) 1200-1000 MG-UNIT CHEW Chew 1 tablet by mouth daily. 30 each   .  diphenhydrAMINE (BENADRYL) 25 mg capsule Take 1 capsule (25 mg total) by mouth at bedtime as needed for sleep.    . fentaNYL (DURAGESIC -  DOSED MCG/HR) 100 MCG/HR Place 1 patch (100 mcg total) onto the skin every 3 (three) days. Pt taking161mcg total ( +53mcg=125mcg) every 72 hrs. 5 patch 0  . fentaNYL (DURAGESIC - DOSED MCG/HR) 25 MCG/HR patch Place 1 patch (25 mcg total) onto the skin every 3 (three) days. Pt to take total ( +35mcg=125mcg) every 72 hrs. 5 patch 0  . HYDROmorphone (DILAUDID) 4 MG tablet Take 1 tablet (4 mg total) by mouth every 6 (six) hours as needed for moderate pain or severe pain. 60 tablet 0  . LORazepam (ATIVAN) 0.5 MG tablet Take 1 tablet (0.5 mg total) by mouth 2 (two) times daily as needed (anxiety or sleep). 20 tablet 0  . naloxegol oxalate (MOVANTIK) 12.5 MG TABS tablet Take 1 tablet (12.5 mg total) by mouth daily. 30 tablet 3  . ondansetron (ZOFRAN) 8 MG tablet Take 1 tablet (8 mg total) by mouth every 8 (eight) hours as needed for nausea or vomiting. 90 tablet 0  . palbociclib (IBRANCE) 100 MG capsule Take 1 capsule (100 mg total) by mouth daily with breakfast. Take whole with food. Take for 3 weeks on, 1 week off. 21 capsule 3  . polyethylene glycol (MIRALAX / GLYCOLAX) packet Take 17 g by mouth 3 (three) times daily. 90 each 6   No current facility-administered medications for this visit.     PHYSICAL EXAMINATION: ECOG PERFORMANCE STATUS: 1 - Symptomatic but completely ambulatory  Vitals:   11/22/16 1043  BP: 118/73  Pulse: 95  Resp: 18  Temp: 98.2 F (36.8 C)  SpO2: 100%   Filed Weights   11/22/16 1043  Weight: 131 lb 11.2 oz (59.7 kg)    GENERAL:alert, no distress and comfortable SKIN: skin color, texture, turgor are normal, no rashes or significant lesions EYES: normal, Conjunctiva are pink and non-injected, sclera clear OROPHARYNX:no exudate, no erythema and lips, buccal mucosa, and tongue normal  NECK: supple, thyroid normal size, non-tender, without nodularity LYMPH:  no palpable lymphadenopathy in the cervical, axillary or inguinal LUNGS: clear to  auscultation and percussion with normal breathing effort HEART: regular rate & rhythm and no murmurs and no lower extremity edema ABDOMEN: Mild to moderate abdominal distention MUSCULOSKELETAL:no cyanosis of digits and no clubbing  NEURO: alert & oriented x 3 with fluent speech, no focal motor/sensory deficits EXTREMITIES: No lower extremity edema  LABORATORY DATA:  I have reviewed the data as listed   Chemistry      Component Value Date/Time   NA 140 11/22/2016 1022   K 4.4 11/22/2016 1022   CL 100 (L) 10/04/2016 0430   CO2 28 11/22/2016 1022   BUN 8.8 11/22/2016 1022   CREATININE 0.7 11/22/2016 1022      Component Value Date/Time   CALCIUM 9.1 11/22/2016 1022   ALKPHOS 66 11/22/2016 1022   AST 14 11/22/2016 1022   ALT 8 11/22/2016 1022   BILITOT 0.24 11/22/2016 1022       Lab Results  Component Value Date   WBC 2.7 (L) 11/22/2016   HGB 9.7 (L) 11/22/2016   HCT 30.4 (L) 11/22/2016   MCV 99.0 11/22/2016   PLT 254 11/22/2016   NEUTROABS 1.6 11/22/2016    ASSESSMENT & PLAN:  Metastatic breast cancer (H[facility]) Metastatic breast cancer with bone, lymph nodes, liver, pleural effusion, ascites, pelvic masses and intrarenal metastases diagnosed  07/15/2016 (Prior history of stage IIIc breast cancer December 2015 treated with mastectomy, adjuvant chemotherapy, radiation and tamoxifen)  Current treatment: Palbociclib with Faslodex and Xgeva Hospitalization for MSSA spontaneous bacterial peritonitis 09/09/2016- 09/16/2016  Plan: 1. Palbociclib 100 mg 3 weeks on one week off. Will administer Faslodex and Xgeva today. 2. Bone metastases: Xgeva once a month along with calcium and vitamin D.  Pain from bone metastases: Currently on fentanyl patch 75g every 72 hours along with Dilaudid. Nausea due to Ibrance: I instructed her to take Zofran 30 minutes before taking Ibrance. Severe lower extremity edema: Resolved so we will discontinue Lasix and potassium  Severe pancytopenia:  Much improved with 100 mg of Ibrance. Sent this dose to her specialty pharmacy.  Major depression and anxiety: Patient is currently on Wellbutrin and Ativan. She tells me that it is not working for her. Psychiatric referral. I renewed her prescription for Wellbutrin. She has also been previously taking Adderall.  She takes 10 mg every day to help her concentrate at work.  Until she finds a primary care physician I will refill her Adderall prescription.  Return to clinic every 4 weeks for labs and follow-up after CT chest abdomen pelvis  Our plan is to obtain scans every 3 months which will be in December and follow-up after that.   I spent 25 minutes talking to the patient of which more than half was spent in counseling and coordination of care.  Orders Placed This Encounter  Procedures  . CT Chest W Contrast    Standing Status:   Future    Standing Expiration Date:   11/22/2017    Order Specific Question:   If indicated for the ordered procedure, I authorize the administration of contrast media per Radiology protocol    Answer:   Yes    Order Specific Question:   Is patient pregnant?    Answer:   No    Order Specific Question:   Preferred imaging location?    Answer:   [facility]    Order Specific Question:   Radiology Contrast Protocol - do NOT remove file path    Answer:   [email or web address]    Order Specific Question:   Reason for Exam additional comments    Answer:   Metastatic breast cancer restaging  . CT Abdomen Pelvis W Contrast    Standing Status:   Future    Standing Expiration Date:   11/22/2017    Order Specific Question:   If indicated for the ordered procedure, I authorize the administration of contrast media per Radiology protocol    Answer:   Yes    Order Specific Question:   Is patient pregnant?    Answer:   No    Order Specific Question:   Preferred imaging location?    Answer:   [facility]    Order Specific  Question:   Radiology Contrast Protocol - do NOT remove file path    Answer:   \\[email or web address]    Order Specific Question:   Reason for Exam additional comments    Answer:   Metastatic breast cancer restaging   The patient has a good understanding of the overall plan. she agrees with it. she will call with any problems that may develop before the next visit here.   Rulon Eisenmenger, MD 11/22/16

## 2016-11-22 NOTE — Telephone Encounter (Signed)
Scheduled appts per 10/29 los. Patient did not want avs or calendar. Gave patient contrast for CT scans.

## 2016-11-22 NOTE — Patient Instructions (Signed)
Fulvestrant injection What is this medicine? FULVESTRANT (ful VES trant) blocks the effects of estrogen. It is used to treat breast cancer. This medicine may be used for other purposes; ask your health care provider or pharmacist if you have questions. COMMON BRAND NAME(S): FASLODEX What should I tell my health care provider before I take this medicine? They need to know if you have any of these conditions: -bleeding problems -liver disease -low levels of platelets in the blood -an unusual or allergic reaction to fulvestrant, other medicines, foods, dyes, or preservatives -pregnant or trying to get pregnant -breast-feeding How should I use this medicine? This medicine is for injection into a muscle. It is usually given by a health care professional in a hospital or clinic setting. Talk to your pediatrician regarding the use of this medicine in children. Special care may be needed. Overdosage: If you think you have taken too much of this medicine contact a poison control center or emergency room at once. NOTE: This medicine is only for you. Do not share this medicine with others. What if I miss a dose? It is important not to miss your dose. Call your doctor or health care professional if you are unable to keep an appointment. What may interact with this medicine? -medicines that treat or prevent blood clots like warfarin, enoxaparin, and dalteparin This list may not describe all possible interactions. Give your health care provider a list of all the medicines, herbs, non-prescription drugs, or dietary supplements you use. Also tell them if you smoke, drink alcohol, or use illegal drugs. Some items may interact with your medicine. What should I watch for while using this medicine? Your condition will be monitored carefully while you are receiving this medicine. You will need important blood work done while you are taking this medicine. Do not become pregnant while taking this medicine or for  at least 1 year after stopping it. Women of child-bearing potential will need to have a negative pregnancy test before starting this medicine. Women should inform their doctor if they wish to become pregnant or think they might be pregnant. There is a potential for serious side effects to an unborn child. Men should inform their doctors if they wish to father a child. This medicine may lower sperm counts. Talk to your health care professional or pharmacist for more information. Do not breast-feed an infant while taking this medicine or for 1 year after the last dose. What side effects may I notice from receiving this medicine? Side effects that you should report to your doctor or health care professional as soon as possible: -allergic reactions like skin rash, itching or hives, swelling of the face, lips, or tongue -feeling faint or lightheaded, falls -pain, tingling, numbness, or weakness in the legs -signs and symptoms of infection like fever or chills; cough; flu-like symptoms; sore throat -vaginal bleeding Side effects that usually do not require medical attention (report to your doctor or health care professional if they continue or are bothersome): -aches, pains -constipation -diarrhea -headache -hot flashes -nausea, vomiting -pain at site where injected -stomach pain This list may not describe all possible side effects. Call your doctor for medical advice about side effects. You may report side effects to FDA at 1-800-FDA-1088. Where should I keep my medicine? This drug is given in a hospital or clinic and will not be stored at home. NOTE: This sheet is a summary. It may not cover all possible information. If you have questions about this medicine, talk to your   doctor, pharmacist, or health care provider.  2018 Elsevier/Gold Standard (2014-08-09 11:03:55) Denosumab injection What is this medicine? DENOSUMAB (den oh sue mab) slows bone breakdown. Prolia is used to treat osteoporosis in  women after menopause and in men. Delton See is used to treat a high calcium level due to cancer and to prevent bone fractures and other bone problems caused by multiple myeloma or cancer bone metastases. Delton See is also used to treat giant cell tumor of the bone. This medicine may be used for other purposes; ask your health care provider or pharmacist if you have questions. COMMON BRAND NAME(S): Prolia, XGEVA What should I tell my health care provider before I take this medicine? They need to know if you have any of these conditions: -dental disease -having surgery or tooth extraction -infection -kidney disease -low levels of calcium or Vitamin D in the blood -malnutrition -on hemodialysis -skin conditions or sensitivity -thyroid or parathyroid disease -an unusual reaction to denosumab, other medicines, foods, dyes, or preservatives -pregnant or trying to get pregnant -breast-feeding How should I use this medicine? This medicine is for injection under the skin. It is given by a health care professional in a hospital or clinic setting. If you are getting Prolia, a special MedGuide will be given to you by the pharmacist with each prescription and refill. Be sure to read this information carefully each time. For Prolia, talk to your pediatrician regarding the use of this medicine in children. Special care may be needed. For Delton See, talk to your pediatrician regarding the use of this medicine in children. While this drug may be prescribed for children as young as 13 years for selected conditions, precautions do apply. Overdosage: If you think you have taken too much of this medicine contact a poison control center or emergency room at once. NOTE: This medicine is only for you. Do not share this medicine with others. What if I miss a dose? It is important not to miss your dose. Call your doctor or health care professional if you are unable to keep an appointment. What may interact with this  medicine? Do not take this medicine with any of the following medications: -other medicines containing denosumab This medicine may also interact with the following medications: -medicines that lower your chance of fighting infection -steroid medicines like prednisone or cortisone This list may not describe all possible interactions. Give your health care provider a list of all the medicines, herbs, non-prescription drugs, or dietary supplements you use. Also tell them if you smoke, drink alcohol, or use illegal drugs. Some items may interact with your medicine. What should I watch for while using this medicine? Visit your doctor or health care professional for regular checks on your progress. Your doctor or health care professional may order blood tests and other tests to see how you are doing. Call your doctor or health care professional for advice if you get a fever, chills or sore throat, or other symptoms of a cold or flu. Do not treat yourself. This drug may decrease your body's ability to fight infection. Try to avoid being around people who are sick. You should make sure you get enough calcium and vitamin D while you are taking this medicine, unless your doctor tells you not to. Discuss the foods you eat and the vitamins you take with your health care professional. See your dentist regularly. Brush and floss your teeth as directed. Before you have any dental work done, tell your dentist you are receiving this medicine. Do  not become pregnant while taking this medicine or for 5 months after stopping it. Talk with your doctor or health care professional about your birth control options while taking this medicine. Women should inform their doctor if they wish to become pregnant or think they might be pregnant. There is a potential for serious side effects to an unborn child. Talk to your health care professional or pharmacist for more information. What side effects may I notice from receiving this  medicine? Side effects that you should report to your doctor or health care professional as soon as possible: -allergic reactions like skin rash, itching or hives, swelling of the face, lips, or tongue -bone pain -breathing problems -dizziness -jaw pain, especially after dental work -redness, blistering, peeling of the skin -signs and symptoms of infection like fever or chills; cough; sore throat; pain or trouble passing urine -signs of low calcium like fast heartbeat, muscle cramps or muscle pain; pain, tingling, numbness in the hands or feet; seizures -unusual bleeding or bruising -unusually weak or tired Side effects that usually do not require medical attention (report to your doctor or health care professional if they continue or are bothersome): -constipation -diarrhea -headache -joint pain -loss of appetite -muscle pain -runny nose -tiredness -upset stomach This list may not describe all possible side effects. Call your doctor for medical advice about side effects. You may report side effects to FDA at 1-800-FDA-1088. Where should I keep my medicine? This medicine is only given in a clinic, doctor's office, or other health care setting and will not be stored at home. NOTE: This sheet is a summary. It may not cover all possible information. If you have questions about this medicine, talk to your doctor, pharmacist, or health care provider.  2018 Elsevier/Gold Standard (2016-02-03 19:17:21)  

## 2016-11-23 ENCOUNTER — Other Ambulatory Visit: Payer: Self-pay

## 2016-11-23 MED ORDER — FENTANYL 75 MCG/HR TD PT72: 75.0000 ug | MEDICATED_PATCH | TRANSDERMAL | 0 refills | Status: DC

## 2016-11-23 MED ORDER — FENTANYL 50 MCG/HR TD PT72: 50.0000 ug | MEDICATED_PATCH | TRANSDERMAL | 0 refills | Status: DC

## 2016-11-23 NOTE — Progress Notes (Signed)
Pt calling to request to have her future fentanyl prescriptions on hand because she will be out of state for over a month for work and visiting with family. Pt would like to titrate down her fentanyl dose. She is currently on 125mcg q72h and then 30mcg, lastly down to 19mcg and 103mcg.   Per Dr.Gudena, okay to give prescription for 40mcg and 14mcg since she will be out of town for more than a month. Called and notified pt that she may come in this afternoon to pick up prescriptions. Pt very appreciative of time and will come in this afternoon.

## 2016-11-26 ENCOUNTER — Encounter: Payer: Self-pay | Admitting: Hematology and Oncology

## 2016-11-26 ENCOUNTER — Ambulatory Visit (HOSPITAL_COMMUNITY): Payer: BLUE CROSS/BLUE SHIELD | Admitting: Psychiatry

## 2016-12-09 ENCOUNTER — Encounter: Payer: Self-pay | Admitting: Hematology and Oncology

## 2016-12-19 ENCOUNTER — Encounter: Payer: Self-pay | Admitting: Hematology and Oncology

## 2016-12-20 ENCOUNTER — Ambulatory Visit: Payer: BLUE CROSS/BLUE SHIELD

## 2016-12-21 ENCOUNTER — Other Ambulatory Visit: Payer: Self-pay

## 2016-12-21 ENCOUNTER — Telehealth: Payer: Self-pay

## 2016-12-21 DIAGNOSIS — F419 Anxiety disorder, unspecified: Secondary | ICD-10-CM

## 2016-12-21 MED ORDER — HYDROMORPHONE HCL 4 MG PO TABS: 4.0000 mg | ORAL_TABLET | Freq: Four times a day (QID) | ORAL | 0 refills | Status: DC | PRN

## 2016-12-21 MED ORDER — LORAZEPAM 1 MG PO TABS: 1.0000 mg | ORAL_TABLET | Freq: Three times a day (TID) | ORAL | 0 refills | Status: DC

## 2016-12-21 MED ORDER — FENTANYL 75 MCG/HR TD PT72: 75.0000 ug | MEDICATED_PATCH | TRANSDERMAL | 0 refills | Status: DC

## 2016-12-21 NOTE — Telephone Encounter (Signed)
Pt came in the [facility] lobby and ask for this RN to explain her symptoms related to her pain medication.  Pt states that she had been weaning off of her fentanyl patch. She is currently down to 38mcg q72 hrs. She had gone down from 175, 150, 125, 100, 75, 47mcg over a span of a few months. Pt had been working out of town for her job and had experienced severe panic attacks, profuse sweating and depression and anxiety x 2 weeks, which was the time she had gone down to 59mcg of fentanyl. Pt wants to know if this is a sign of withdrawal symptoms. She also adds that her pain is increasing since the last wean. Pt normally doesn't take her dilaudid unless she absolutely has to, but she had been taking 6mg  every couple hours in addition to her 32mcg of fentanyl.  Discussed with Dr.Gudena regarding pt signs of withdrawals from fentanyl and increase in pain symptoms. He is okay to increase back up to 6mcg fentanyl for another month before weaning down the dose. Dilaudid can be refilled, as well as lorazepam.   Spoke with pt with specific instructions on increasing 23mcg of fentanyl from 50mcg. Take dilaudid as needed only for breakthrough pain. Ativan as needed only to manage  symptom (anxiety/panic attack) Told pt to make sure she does not drive under these medications because it is unsafe. Pt states that she never drives for work. She travels via airplane and takes [place] [employer] when she hs to get to work. Pt understands precautions and will call with any further questions.

## 2016-12-21 NOTE — Telephone Encounter (Signed)
Pt has been having extreme hot flashes, sweating profusely then freezing cold. This has been for the last 1-2 weeks. Is there anything Dr Lindi Adie can prescribe for this.   She needs refill on her 50 mcg fentanyl patches.   Leaving on a plane at 3. Unplanned trip.

## 2016-12-22 ENCOUNTER — Ambulatory Visit (HOSPITAL_COMMUNITY): Payer: BLUE CROSS/BLUE SHIELD

## 2016-12-27 ENCOUNTER — Ambulatory Visit: Payer: BLUE CROSS/BLUE SHIELD

## 2016-12-27 ENCOUNTER — Ambulatory Visit (HOSPITAL_BASED_OUTPATIENT_CLINIC_OR_DEPARTMENT_OTHER): Payer: BLUE CROSS/BLUE SHIELD

## 2016-12-27 ENCOUNTER — Ambulatory Visit (HOSPITAL_COMMUNITY)
Admission: RE | Admit: 2016-12-27 | Discharge: 2016-12-27 | Disposition: A | Discharge status: Home / Self Care | Payer: BLUE CROSS/BLUE SHIELD | Source: Ambulatory Visit | Attending: Hematology and Oncology | Admitting: Hematology and Oncology

## 2016-12-27 ENCOUNTER — Other Ambulatory Visit: Payer: Self-pay

## 2016-12-27 ENCOUNTER — Other Ambulatory Visit: Payer: BLUE CROSS/BLUE SHIELD

## 2016-12-27 ENCOUNTER — Ambulatory Visit: Payer: BLUE CROSS/BLUE SHIELD | Admitting: Hematology and Oncology

## 2016-12-27 ENCOUNTER — Ambulatory Visit (HOSPITAL_COMMUNITY): Payer: BLUE CROSS/BLUE SHIELD

## 2016-12-27 ENCOUNTER — Encounter: Admit: 2016-12-27 | Discharge: 2016-12-28

## 2016-12-27 DIAGNOSIS — R69 Illness, unspecified: Principal | ICD-10-CM

## 2016-12-27 DIAGNOSIS — C7900 Secondary malignant neoplasm of unspecified kidney and renal pelvis: Secondary | ICD-10-CM

## 2016-12-27 DIAGNOSIS — C50919 Malignant neoplasm of unspecified site of unspecified female breast: Secondary | ICD-10-CM

## 2016-12-27 DIAGNOSIS — I700 Atherosclerosis of aorta: Secondary | ICD-10-CM | POA: Insufficient documentation

## 2016-12-27 DIAGNOSIS — R180 Malignant ascites: Secondary | ICD-10-CM | POA: Insufficient documentation

## 2016-12-27 DIAGNOSIS — R600 Localized edema: Secondary | ICD-10-CM | POA: Diagnosis not present

## 2016-12-27 DIAGNOSIS — C786 Secondary malignant neoplasm of retroperitoneum and peritoneum: Secondary | ICD-10-CM | POA: Diagnosis not present

## 2016-12-27 DIAGNOSIS — R1909 Other intra-abdominal and pelvic swelling, mass and lump: Secondary | ICD-10-CM | POA: Diagnosis not present

## 2016-12-27 DIAGNOSIS — E279 Disorder of adrenal gland, unspecified: Secondary | ICD-10-CM | POA: Diagnosis not present

## 2016-12-27 DIAGNOSIS — R918 Other nonspecific abnormal finding of lung field: Secondary | ICD-10-CM | POA: Diagnosis not present

## 2016-12-27 DIAGNOSIS — K5710 Diverticulosis of small intestine without perforation or abscess without bleeding: Secondary | ICD-10-CM | POA: Diagnosis not present

## 2016-12-27 DIAGNOSIS — C7951 Secondary malignant neoplasm of bone: Secondary | ICD-10-CM | POA: Insufficient documentation

## 2016-12-27 DIAGNOSIS — N3289 Other specified disorders of bladder: Secondary | ICD-10-CM | POA: Insufficient documentation

## 2016-12-27 DIAGNOSIS — Z5111 Encounter for antineoplastic chemotherapy: Secondary | ICD-10-CM

## 2016-12-27 LAB — COMPREHENSIVE METABOLIC PANEL
ALT: 7 U/L (ref 0–55)
ANION GAP: 8 meq/L (ref 3–11)
AST: 14 U/L (ref 5–34)
Albumin: 3.7 g/dL (ref 3.5–5.0)
Alkaline Phosphatase: 61 U/L (ref 40–150)
BUN: 10.4 mg/dL (ref 7.0–26.0)
CHLORIDE: 104 meq/L (ref 98–109)
CO2: 27 meq/L (ref 22–29)
Calcium: 9.2 mg/dL (ref 8.4–10.4)
Creatinine: 0.8 mg/dL (ref 0.6–1.1)
Glucose: 85 mg/dl (ref 70–140)
POTASSIUM: 4.2 meq/L (ref 3.5–5.1)
Sodium: 139 mEq/L (ref 136–145)
Total Bilirubin: 0.26 mg/dL (ref 0.20–1.20)
Total Protein: 7.2 g/dL (ref 6.4–8.3)

## 2016-12-27 LAB — CBC WITH DIFFERENTIAL/PLATELET
BASO%: 2.1 % — ABNORMAL HIGH (ref 0.0–2.0)
BASOS ABS: 0.1 10*3/uL (ref 0.0–0.1)
EOS ABS: 0 10*3/uL (ref 0.0–0.5)
EOS%: 0.5 % (ref 0.0–7.0)
HCT: 33.2 % — ABNORMAL LOW (ref 34.8–46.6)
HEMOGLOBIN: 11.1 g/dL — AB (ref 11.6–15.9)
LYMPH%: 43.1 % (ref 14.0–49.7)
MCH: 34 pg (ref 25.1–34.0)
MCHC: 33.5 g/dL (ref 31.5–36.0)
MCV: 101.6 fL — AB (ref 79.5–101.0)
MONO#: 0.2 10*3/uL (ref 0.1–0.9)
MONO%: 8.4 % (ref 0.0–14.0)
NEUT#: 1.1 10*3/uL — ABNORMAL LOW (ref 1.5–6.5)
NEUT%: 45.9 % (ref 38.4–76.8)
PLATELETS: 259 10*3/uL (ref 145–400)
RBC: 3.26 10*6/uL — AB (ref 3.70–5.45)
RDW: 16.7 % — ABNORMAL HIGH (ref 11.2–14.5)
WBC: 2.4 10*3/uL — ABNORMAL LOW (ref 3.9–10.3)
lymph#: 1 10*3/uL (ref 0.9–3.3)

## 2016-12-27 MED ORDER — FULVESTRANT 250 MG/5ML IM SOLN
500.0000 mg | Freq: Once | INTRAMUSCULAR | Status: AC
  Administered 2016-12-27: 500 mg via INTRAMUSCULAR
  Filled 2016-12-27: qty 10

## 2016-12-27 MED ORDER — FENTANYL 75 MCG/HR TD PT72: 75.0000 ug | MEDICATED_PATCH | TRANSDERMAL | 0 refills | Status: DC

## 2016-12-27 MED ORDER — IOPAMIDOL (ISOVUE-300) INJECTION 61%
INTRAVENOUS | Status: AC
  Administered 2016-12-27: 100 mL
  Filled 2016-12-27: qty 100

## 2016-12-27 MED ORDER — DENOSUMAB 120 MG/1.7ML ~~LOC~~ SOLN
120.0000 mg | Freq: Once | SUBCUTANEOUS | Status: AC
  Administered 2016-12-27: 120 mg via SUBCUTANEOUS
  Filled 2016-12-27: qty 1.7

## 2016-12-27 NOTE — Patient Instructions (Addendum)
Denosumab injection What is this medicine? DENOSUMAB (den oh sue mab) slows bone breakdown. Prolia is used to treat osteoporosis in women after menopause and in men. Delton See is used to treat a high calcium level due to cancer and to prevent bone fractures and other bone problems caused by multiple myeloma or cancer bone metastases. Delton See is also used to treat giant cell tumor of the bone. This medicine may be used for other purposes; ask your health care provider or pharmacist if you have questions. COMMON BRAND NAME(S): Prolia, XGEVA What should I tell my health care provider before I take this medicine? They need to know if you have any of these conditions: -dental disease -having surgery or tooth extraction -infection -kidney disease -low levels of calcium or Vitamin D in the blood -malnutrition -on hemodialysis -skin conditions or sensitivity -thyroid or parathyroid disease -an unusual reaction to denosumab, other medicines, foods, dyes, or preservatives -pregnant or trying to get pregnant -breast-feeding How should I use this medicine? This medicine is for injection under the skin. It is given by a health care professional in a hospital or clinic setting. If you are getting Prolia, a special MedGuide will be given to you by the pharmacist with each prescription and refill. Be sure to read this information carefully each time. For Prolia, talk to your pediatrician regarding the use of this medicine in children. Special care may be needed. For Delton See, talk to your pediatrician regarding the use of this medicine in children. While this drug may be prescribed for children as young as 13 years for selected conditions, precautions do apply. Overdosage: If you think you have taken too much of this medicine contact a poison control center or emergency room at once. NOTE: This medicine is only for you. Do not share this medicine with others. What if I miss a dose? It is important not to miss your  dose. Call your doctor or health care professional if you are unable to keep an appointment. What may interact with this medicine? Do not take this medicine with any of the following medications: -other medicines containing denosumab This medicine may also interact with the following medications: -medicines that lower your chance of fighting infection -steroid medicines like prednisone or cortisone This list may not describe all possible interactions. Give your health care provider a list of all the medicines, herbs, non-prescription drugs, or dietary supplements you use. Also tell them if you smoke, drink alcohol, or use illegal drugs. Some items may interact with your medicine. What should I watch for while using this medicine? Visit your doctor or health care professional for regular checks on your progress. Your doctor or health care professional may order blood tests and other tests to see how you are doing. Call your doctor or health care professional for advice if you get a fever, chills or sore throat, or other symptoms of a cold or flu. Do not treat yourself. This drug may decrease your body's ability to fight infection. Try to avoid being around people who are sick. You should make sure you get enough calcium and vitamin D while you are taking this medicine, unless your doctor tells you not to. Discuss the foods you eat and the vitamins you take with your health care professional. See your dentist regularly. Brush and floss your teeth as directed. Before you have any dental work done, tell your dentist you are receiving this medicine. Do not become pregnant while taking this medicine or for 5 months after stopping  it. Talk with your doctor or health care professional about your birth control options while taking this medicine. Women should inform their doctor if they wish to become pregnant or think they might be pregnant. There is a potential for serious side effects to an unborn child. Talk  to your health care professional or pharmacist for more information. What side effects may I notice from receiving this medicine? Side effects that you should report to your doctor or health care professional as soon as possible: -allergic reactions like skin rash, itching or hives, swelling of the face, lips, or tongue -bone pain -breathing problems -dizziness -jaw pain, especially after dental work -redness, blistering, peeling of the skin -signs and symptoms of infection like fever or chills; cough; sore throat; pain or trouble passing urine -signs of low calcium like fast heartbeat, muscle cramps or muscle pain; pain, tingling, numbness in the hands or feet; seizures -unusual bleeding or bruising -unusually weak or tired Side effects that usually do not require medical attention (report to your doctor or health care professional if they continue or are bothersome): -constipation -diarrhea -headache -joint pain -loss of appetite -muscle pain -runny nose -tiredness -upset stomach This list may not describe all possible side effects. Call your doctor for medical advice about side effects. You may report side effects to FDA at 1-800-FDA-1088. Where should I keep my medicine? This medicine is only given in a clinic, doctor's office, or other health care setting and will not be stored at home. NOTE: This sheet is a summary. It may not cover all possible information. If you have questions about this medicine, talk to your doctor, pharmacist, or health care provider.  2018 Elsevier/Gold Standard (2016-02-03 19:17:21)   Fulvestrant injection What is this medicine? FULVESTRANT (ful VES trant) blocks the effects of estrogen. It is used to treat breast cancer. This medicine may be used for other purposes; ask your health care provider or pharmacist if you have questions. COMMON BRAND NAME(S): FASLODEX What should I tell my health care provider before I take this medicine? They need to  know if you have any of these conditions: -bleeding problems -liver disease -low levels of platelets in the blood -an unusual or allergic reaction to fulvestrant, other medicines, foods, dyes, or preservatives -pregnant or trying to get pregnant -breast-feeding How should I use this medicine? This medicine is for injection into a muscle. It is usually given by a health care professional in a hospital or clinic setting. Talk to your pediatrician regarding the use of this medicine in children. Special care may be needed. Overdosage: If you think you have taken too much of this medicine contact a poison control center or emergency room at once. NOTE: This medicine is only for you. Do not share this medicine with others. What if I miss a dose? It is important not to miss your dose. Call your doctor or health care professional if you are unable to keep an appointment. What may interact with this medicine? -medicines that treat or prevent blood clots like warfarin, enoxaparin, and dalteparin This list may not describe all possible interactions. Give your health care provider a list of all the medicines, herbs, non-prescription drugs, or dietary supplements you use. Also tell them if you smoke, drink alcohol, or use illegal drugs. Some items may interact with your medicine. What should I watch for while using this medicine? Your condition will be monitored carefully while you are receiving this medicine. You will need important blood work done while you   are taking this medicine. Do not become pregnant while taking this medicine or for at least 1 year after stopping it. Women of child-bearing potential will need to have a negative pregnancy test before starting this medicine. Women should inform their doctor if they wish to become pregnant or think they might be pregnant. There is a potential for serious side effects to an unborn child. Men should inform their doctors if they wish to father a child. This  medicine may lower sperm counts. Talk to your health care professional or pharmacist for more information. Do not breast-feed an infant while taking this medicine or for 1 year after the last dose. What side effects may I notice from receiving this medicine? Side effects that you should report to your doctor or health care professional as soon as possible: -allergic reactions like skin rash, itching or hives, swelling of the face, lips, or tongue -feeling faint or lightheaded, falls -pain, tingling, numbness, or weakness in the legs -signs and symptoms of infection like fever or chills; cough; flu-like symptoms; sore throat -vaginal bleeding Side effects that usually do not require medical attention (report to your doctor or health care professional if they continue or are bothersome): -aches, pains -constipation -diarrhea -headache -hot flashes -nausea, vomiting -pain at site where injected -stomach pain This list may not describe all possible side effects. Call your doctor for medical advice about side effects. You may report side effects to FDA at 1-800-FDA-1088. Where should I keep my medicine? This drug is given in a hospital or clinic and will not be stored at home. NOTE: This sheet is a summary. It may not cover all possible information. If you have questions about this medicine, talk to your doctor, pharmacist, or health care provider.  2018 Elsevier/Gold Standard (2014-08-09 11:03:55)  

## 2016-12-28 ENCOUNTER — Telehealth: Payer: Self-pay

## 2016-12-28 ENCOUNTER — Ambulatory Visit (HOSPITAL_COMMUNITY): Payer: BLUE CROSS/BLUE SHIELD | Admitting: Psychiatry

## 2016-12-28 NOTE — Telephone Encounter (Signed)
Called to inform pt that script refill was available for pickup. Pt mother to pick up on Friday.  Cyndia Bent RN

## 2016-12-29 ENCOUNTER — Ambulatory Visit (HOSPITAL_COMMUNITY): Payer: BLUE CROSS/BLUE SHIELD

## 2016-12-30 ENCOUNTER — Encounter: Payer: Self-pay | Admitting: Hematology and Oncology

## 2016-12-30 ENCOUNTER — Ambulatory Visit: Payer: BLUE CROSS/BLUE SHIELD | Admitting: Hematology and Oncology

## 2016-12-30 ENCOUNTER — Other Ambulatory Visit: Payer: BLUE CROSS/BLUE SHIELD

## 2016-12-30 ENCOUNTER — Ambulatory Visit: Payer: BLUE CROSS/BLUE SHIELD

## 2017-01-04 ENCOUNTER — Encounter: Payer: Self-pay | Admitting: Hematology and Oncology

## 2017-01-04 ENCOUNTER — Other Ambulatory Visit: Payer: Self-pay | Admitting: *Deleted

## 2017-01-04 MED ORDER — POLYETHYLENE GLYCOL 3350 17 G PO PACK: 17.0000 g | PACK | Freq: Three times a day (TID) | ORAL | 6 refills | Status: DC

## 2017-01-05 ENCOUNTER — Telehealth: Payer: Self-pay | Admitting: Hematology and Oncology

## 2017-01-05 NOTE — Telephone Encounter (Signed)
Per scheduling message 12/11 spoke with patient regarding appt

## 2017-01-07 ENCOUNTER — Other Ambulatory Visit (HOSPITAL_BASED_OUTPATIENT_CLINIC_OR_DEPARTMENT_OTHER): Payer: BLUE CROSS/BLUE SHIELD

## 2017-01-07 ENCOUNTER — Ambulatory Visit (HOSPITAL_BASED_OUTPATIENT_CLINIC_OR_DEPARTMENT_OTHER): Payer: BLUE CROSS/BLUE SHIELD | Admitting: Hematology and Oncology

## 2017-01-07 ENCOUNTER — Telehealth: Payer: Self-pay | Admitting: Hematology and Oncology

## 2017-01-07 DIAGNOSIS — F418 Other specified anxiety disorders: Secondary | ICD-10-CM | POA: Diagnosis not present

## 2017-01-07 DIAGNOSIS — C50919 Malignant neoplasm of unspecified site of unspecified female breast: Secondary | ICD-10-CM | POA: Diagnosis not present

## 2017-01-07 DIAGNOSIS — Z170 Estrogen receptor positive status [ER+]: Principal | ICD-10-CM

## 2017-01-07 DIAGNOSIS — C7951 Secondary malignant neoplasm of bone: Secondary | ICD-10-CM

## 2017-01-07 DIAGNOSIS — R180 Malignant ascites: Secondary | ICD-10-CM

## 2017-01-07 DIAGNOSIS — C787 Secondary malignant neoplasm of liver and intrahepatic bile duct: Secondary | ICD-10-CM | POA: Diagnosis not present

## 2017-01-07 DIAGNOSIS — D61818 Other pancytopenia: Secondary | ICD-10-CM | POA: Diagnosis not present

## 2017-01-07 DIAGNOSIS — G629 Polyneuropathy, unspecified: Secondary | ICD-10-CM

## 2017-01-07 DIAGNOSIS — G893 Neoplasm related pain (acute) (chronic): Secondary | ICD-10-CM

## 2017-01-07 DIAGNOSIS — C50512 Malignant neoplasm of lower-outer quadrant of left female breast: Secondary | ICD-10-CM

## 2017-01-07 DIAGNOSIS — E279 Disorder of adrenal gland, unspecified: Secondary | ICD-10-CM | POA: Diagnosis not present

## 2017-01-07 LAB — COMPREHENSIVE METABOLIC PANEL
ALT: 8 U/L (ref 0–55)
ANION GAP: 8 meq/L (ref 3–11)
AST: 14 U/L (ref 5–34)
Albumin: 3.7 g/dL (ref 3.5–5.0)
Alkaline Phosphatase: 58 U/L (ref 40–150)
BILIRUBIN TOTAL: 0.27 mg/dL (ref 0.20–1.20)
BUN: 11.5 mg/dL (ref 7.0–26.0)
CHLORIDE: 103 meq/L (ref 98–109)
CO2: 29 meq/L (ref 22–29)
Calcium: 9.2 mg/dL (ref 8.4–10.4)
Creatinine: 0.8 mg/dL (ref 0.6–1.1)
Glucose: 71 mg/dl (ref 70–140)
POTASSIUM: 4.2 meq/L (ref 3.5–5.1)
Sodium: 140 mEq/L (ref 136–145)
Total Protein: 7.2 g/dL (ref 6.4–8.3)

## 2017-01-07 LAB — CBC WITH DIFFERENTIAL/PLATELET
BASO%: 2 % (ref 0.0–2.0)
BASOS ABS: 0.1 10*3/uL (ref 0.0–0.1)
EOS ABS: 0 10*3/uL (ref 0.0–0.5)
EOS%: 0.4 % (ref 0.0–7.0)
HEMATOCRIT: 34.7 % — AB (ref 34.8–46.6)
HEMOGLOBIN: 11.5 g/dL — AB (ref 11.6–15.9)
LYMPH#: 0.8 10*3/uL — AB (ref 0.9–3.3)
LYMPH%: 33.2 % (ref 14.0–49.7)
MCH: 34.5 pg — AB (ref 25.1–34.0)
MCHC: 33.1 g/dL (ref 31.5–36.0)
MCV: 104.2 fL — ABNORMAL HIGH (ref 79.5–101.0)
MONO#: 0.1 10*3/uL (ref 0.1–0.9)
MONO%: 3.6 % (ref 0.0–14.0)
NEUT#: 1.5 10*3/uL (ref 1.5–6.5)
NEUT%: 60.8 % (ref 38.4–76.8)
Platelets: 311 10*3/uL (ref 145–400)
RBC: 3.33 10*6/uL — ABNORMAL LOW (ref 3.70–5.45)
RDW: 13.9 % (ref 11.2–14.5)
WBC: 2.5 10*3/uL — ABNORMAL LOW (ref 3.9–10.3)

## 2017-01-07 NOTE — Progress Notes (Signed)
Patient Care Team: Olena Mater, MD as PCP - General (Internal Medicine) Elveria Rising, MD (Obstetrics and Gynecology)  DIAGNOSIS:  Encounter Diagnosis  Name Primary?  . Metastatic breast cancer ([place])     SUMMARY OF ONCOLOGIC HISTORY:   Metastatic breast cancer ([place])   01/21/2014 Initial Diagnosis    Right breast cancer status post mastectomy and axillary lymph node dissection multifocal disease 4.4 cm, 1.1 cm, 1 mm, ER 93%, PR 40-50%, HER-2 negative ratio 1.3, T2 N3 a stage IIIc diagnosed at [facility] by Dr.Katragadda; treated with adjuvant chemotherapy with before meals 4 followed by Taxotere 12 followed by radiation and tamoxifen      07/15/2016 Relapse/Recurrence    Patient presented with abdominal distention and ascites; CT 07/15/2016 revealed ascites, pleural effusion, pericardial masses, 8 mm right liver nodule, adrenal nodule; biopsy metastatic breast cancer ER/PR positive HER-2 negative; MRI pelvis 07/15/2016 pelvic mass is 5.27 minutes, 5.6 cm, right adrenal nodule 4.2 cm      07/23/2016 Miscellaneous    Paracentesis 1.7 L cytology negative, peritoneal catheter, patient claims that once or twice a week      07/26/2016 PET scan    Pelvic masses, intrarenal mass, portal caval lymph node, anterior mediastinal lymph node, several bone lesions T8 compression fracture and right femur concern for impending fracture, right pleural effusion, ascites      08/08/2016 -  Anti-estrogen oral therapy    Faslodex (loading doses given on 08/08/2016 and 08/23/2016) Palbociclib (started 08/23/2016) and Xgeva (last injection 08/12/2016)      08/10/2016 - 08/24/2016 Radiation Therapy    Radiation therapy to T8 and bilateral femurs (complication: Esophagitis): Radiation done in [place] ([facility])      09/09/2016 - 09/16/2016 Hospital Admission    Hospitalization for infected loculated ascites (MSSA SBP), pancytopenia      12/27/2016 Imaging    CT CAP:  Intra-abdominal ascites and tumor deposits mildly improved, right adrenal mass reduced in size, bone metastases stable, resolved right pleural effusion, previously enlarged right paratracheal lymph node is smaller       CHIEF COMPLIANT: Follow-up on Faslodex with Leslee Home and Delton See  INTERVAL HISTORY: Tamara Byrd is a 51 year old with above-mentioned metastatic breast cancer who is currently on Faslodex with [place] and Xgeva.  She has been tolerating the treatment reasonably well.  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  She also has fatigue.  Denies any nausea vomiting.  She has not had any problems with ascites.  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  REVIEW OF SYSTEMS:   Constitutional: Denies fevers, chills or abnormal weight loss Eyes: Denies blurriness of vision Ears, nose, mouth, throat, and face: Denies mucositis or sore throat Respiratory: Denies cough, dyspnea or wheezes Cardiovascular: Denies palpitation, chest discomfort Gastrointestinal:  Denies nausea, heartburn or change in bowel habits Skin: Denies abnormal skin rashes Lymphatics: Denies new lymphadenopathy or easy bruising Neurological: Neuropathy in the fingers intermittently Behavioral/Psych: Mood is stable, no new changes  Extremities: No lower extremity edema  All other systems were reviewed with the patient and are negative.  I have reviewed the past medical history, past surgical history, social history and family history with the patient and they are unchanged from previous note.  ALLERGIES:  has No Known Allergies.  MEDICATIONS:  Current Outpatient Medications  Medication Sig Dispense Refill  . amphetamine-dextroamphetamine (ADDERALL) 10 MG tablet Take 1 tablet (10  mg total) by mouth daily with breakfast. 30 tablet 0  . buPROPion (WELLBUTRIN XL) 150 MG 24 hr tablet Take 1 tablet (150 mg total) by mouth  daily. 30 tablet 6  . Calcium Carbonate-Vit D-Min (CALCIUM 1200) 1200-1000 MG-UNIT CHEW Chew 1 tablet by mouth daily. 30 each   . diphenhydrAMINE (BENADRYL) 25 mg capsule Take 1 capsule (25 mg total) by mouth at bedtime as needed for sleep.    . fentaNYL (DURAGESIC - DOSED MCG/HR) 100 MCG/HR Place 1 patch (100 mcg total) onto the skin every 3 (three) days. Pt taking155mg total (1067m +2563m125mcg) every 72 hrs. 5 patch 0  . fentaNYL (DURAGESIC - DOSED MCG/HR) 50 MCG/HR Place 1 patch (50 mcg total) onto the skin every 3 (three) days. 5 patch 0  . fentaNYL (DURAGESIC - DOSED MCG/HR) 75 MCG/HR Place 1 patch (75 mcg total) onto the skin every 3 (three) days. 5 patch 0  . HYDROmorphone (DILAUDID) 4 MG tablet Take 1 tablet (4 mg total) by mouth every 6 (six) hours as needed for moderate pain or severe pain. 60 tablet 0  . LORazepam (ATIVAN) 1 MG tablet Take 1 tablet (1 mg total) by mouth every 8 (eight) hours. 30 tablet 0  . naloxegol oxalate (MOVANTIK) 12.5 MG TABS tablet Take 1 tablet (12.5 mg total) by mouth daily. 30 tablet 3  . ondansetron (ZOFRAN) 8 MG tablet Take 1 tablet (8 mg total) by mouth every 8 (eight) hours as needed for nausea or vomiting. 90 tablet 0  . palbociclib (IBRANCE) 100 MG capsule Take 1 capsule (100 mg total) by mouth daily with breakfast. Take whole with food. Take for 3 weeks on, 1 week off. 21 capsule 3  . polyethylene glycol (MIRALAX / GLYCOLAX) packet Take 17 g by mouth 3 (three) times daily. 90 each 6   No current facility-administered medications for this visit.     PHYSICAL EXAMINATION: ECOG PERFORMANCE STATUS: 1 - Symptomatic but completely ambulatory  Vitals:   01/07/17 1204  BP: 117/78  Pulse: 80  Resp: 12  Temp: 97.6 F (36.4 C)  SpO2: 99%   Filed Weights   01/07/17 1204  Weight: 143 lb 14.4 oz (65.3 kg)    GENERAL:alert, no distress and comfortable SKIN: skin color, texture, turgor are normal, no rashes or significant lesions EYES: normal,  Conjunctiva are pink and non-injected, sclera clear OROPHARYNX:no exudate, no erythema and lips, buccal mucosa, and tongue normal  NECK: supple, thyroid normal size, non-tender, without nodularity LYMPH:  no palpable lymphadenopathy in the cervical, axillary or inguinal LUNGS: clear to auscultation and percussion with normal breathing effort HEART: regular rate & rhythm and no murmurs and no lower extremity edema ABDOMEN:abdomen soft, non-tender and normal bowel sounds MUSCULOSKELETAL:no cyanosis of digits and no clubbing  NEURO: alert & oriented x 3 with fluent speech, grade 1 peripheral neuropathy EXTREMITIES: No lower extremity edema  LABORATORY DATA:  I have reviewed the data as listed   Chemistry      Component Value Date/Time   NA 140 01/07/2017 1122   K 4.2 01/07/2017 1122   CL 100 (L) 10/04/2016 0430   CO2 29 01/07/2017 1122   BUN 11.5 01/07/2017 1122   CREATININE 0.8 01/07/2017 1122      Component Value Date/Time   CALCIUM 9.2 01/07/2017 1122   ALKPHOS 58 01/07/2017 1122   AST 14 01/07/2017 1122   ALT 8 01/07/2017 1122   BILITOT 0.27 01/07/2017 1122       Lab Results  Component  Value Date   WBC 2.5 (L) 01/07/2017   HGB 11.5 (L) 01/07/2017   HCT 34.7 (L) 01/07/2017   MCV 104.2 (H) 01/07/2017   PLT 311 01/07/2017   NEUTROABS 1.5 01/07/2017    ASSESSMENT & PLAN:  Metastatic breast cancer ([place]) Metastatic breast cancer with bone, lymph nodes, liver, pleural effusion, ascites, pelvic masses and intrarenal metastases diagnosed 07/15/2016 (Prior history of stage IIIc breast cancer December 2015 treated with mastectomy, adjuvant chemotherapy, radiation and tamoxifen)  Current treatment: Palbociclib with Faslodex and Xgeva (every 3 months) Hospitalization for MSSA spontaneous bacterial peritonitis 09/09/2016- 09/16/2016  12/27/2016: CT CAP: Intra-abdominal ascites and tumor deposits mildly improved, right adrenal mass reduced in size, bone metastases stable,  resolved right pleural effusion, previously enlarged right paratracheal lymph node is smaller  I reviewed the scans with the patient that showed improved metastatic disease.  Pain from bone metastases: Currently on fentanyl patch 75g every 72 hours along with Dilaudid.  Will change Xgeva to every 3 months  Major depression and anxiety: Patient is currently on Wellbutrin and Ativan  Pancytopenia: Stable with 100 mg of Ibrance  Return to clinic in 3 months with labs and follow-up in the set of scans.  If the scans also showed continued response then we can scan her every 6 months.   I spent 25 minutes talking to the patient of which more than half was spent in counseling and coordination of care.  Orders Placed This Encounter  Procedures  . CT Abdomen Pelvis W Contrast    Standing Status:   Future    Standing Expiration Date:   01/07/2018    Order Specific Question:   If indicated for the ordered procedure, I authorize the administration of contrast media per Radiology protocol    Answer:   Yes    Order Specific Question:   Is patient pregnant?    Answer:   No    Order Specific Question:   Preferred imaging location?    Answer:   [facility]    Order Specific Question:   Radiology Contrast Protocol - do NOT remove file path    Answer:   [email or web address]    Order Specific Question:   Reason for Exam additional comments    Answer:   Metastatic breast cancer restaging  . CT Chest W Contrast    Standing Status:   Future    Standing Expiration Date:   01/07/2018    Order Specific Question:   If indicated for the ordered procedure, I authorize the administration of contrast media per Radiology protocol    Answer:   Yes    Order Specific Question:   Is patient pregnant?    Answer:   No    Order Specific Question:   Preferred imaging location?    Answer:   [facility]    Order Specific Question:   Radiology Contrast Protocol - do NOT  remove file path    Answer:   [email or web address]    Order Specific Question:   Reason for Exam additional comments    Answer:   Metastatic breast cancer restaging   The patient has a good understanding of the overall plan. she agrees with it. she will call with any problems that may develop before the next visit here.   Rulon Eisenmenger, MD 01/07/17

## 2017-01-07 NOTE — Assessment & Plan Note (Signed)
Metastatic breast cancer with bone, lymph nodes, liver, pleural effusion, ascites, pelvic masses and intrarenal metastases diagnosed 07/15/2016 (Prior history of stage IIIc breast cancer December 2015 treated with mastectomy, adjuvant chemotherapy, radiation and tamoxifen)  Current treatment: Palbociclib with Faslodex and Xgeva Hospitalization for MSSA spontaneous bacterial peritonitis 09/09/2016- 09/16/2016  12/27/2016: CT CAP: Intra-abdominal ascites and tumor deposits mildly improved, right adrenal mass reduced in size, bone metastases stable, resolved right pleural effusion, previously enlarged right paratracheal lymph node is smaller  I reviewed the scans with the patient that showed improved metastatic disease.  Pain from bone metastases: Currently on fentanyl patch 75g every 72 hours along with Dilaudid.  Major depression and anxiety: Patient is currently on Wellbutrin and Ativan  Pancytopenia: Stable with 100 mg of Ibrance  Return to clinic in 3 months with labs and follow-up

## 2017-01-07 NOTE — Telephone Encounter (Signed)
Scheduled appt per 12/14 los - per patient request f/u with inj on 03/15 rather than 3/4 - gave patient AVS And calender per los.

## 2017-01-12 ENCOUNTER — Encounter: Payer: Self-pay | Admitting: Hematology and Oncology

## 2017-01-12 ENCOUNTER — Other Ambulatory Visit: Payer: Self-pay

## 2017-01-12 MED ORDER — HYDROMORPHONE HCL 4 MG PO TABS: 4.0000 mg | ORAL_TABLET | Freq: Four times a day (QID) | ORAL | 0 refills | Status: AC | PRN

## 2017-01-17 ENCOUNTER — Other Ambulatory Visit: Payer: Self-pay

## 2017-01-17 ENCOUNTER — Encounter: Payer: Self-pay | Admitting: Hematology and Oncology

## 2017-01-17 MED ORDER — FENTANYL 75 MCG/HR TD PT72: 75.0000 ug | MEDICATED_PATCH | TRANSDERMAL | 0 refills | Status: DC

## 2017-01-21 ENCOUNTER — Other Ambulatory Visit: Payer: Self-pay | Admitting: Hematology and Oncology

## 2017-01-21 DIAGNOSIS — C50919 Malignant neoplasm of unspecified site of unspecified female breast: Secondary | ICD-10-CM

## 2017-01-24 ENCOUNTER — Encounter: Payer: Self-pay | Admitting: Hematology and Oncology

## 2017-01-24 NOTE — Telephone Encounter (Signed)
Please see if I can see her on Friday VG

## 2017-02-02 ENCOUNTER — Encounter: Payer: Self-pay | Admitting: Hematology and Oncology

## 2017-02-02 ENCOUNTER — Other Ambulatory Visit: Payer: Self-pay

## 2017-02-03 ENCOUNTER — Encounter: Payer: Self-pay | Admitting: Hematology and Oncology

## 2017-02-03 ENCOUNTER — Ambulatory Visit (HOSPITAL_COMMUNITY): Payer: BLUE CROSS/BLUE SHIELD | Admitting: Psychiatry

## 2017-02-03 NOTE — Assessment & Plan Note (Signed)
Metastatic breast cancer with bone, lymph nodes, liver, pleural effusion, ascites, pelvic masses and intrarenal metastases diagnosed 07/15/2016 (Prior history of stage IIIc breast cancer December 2015 treated with mastectomy, adjuvant chemotherapy, radiation and tamoxifen)  Current treatment: Palbociclib with Faslodex and Xgeva (every 3 months) Hospitalization for MSSA spontaneous bacterial peritonitis 09/09/2016- 09/16/2016  12/27/2016: CT CAP: Intra-abdominal ascites and tumor deposits mildly improved, right adrenal mass reduced in size, bone metastases stable, resolved right pleural effusion, previously enlarged right paratracheal lymph node is smaller  I reviewed the scans with the patient that showed improved metastatic disease.  Pain from bone metastases: Currently on fentanyl patch 75g every 72 hours along with Dilaudid.  Will change Xgeva to every 3 months  Major depression and anxiety: Patient is currently on Wellbutrin and Ativan  Pancytopenia: Stable with 100 mg of Ibrance Pain Regimen: Patient wishes to decrease her pain med usage.  RTC in 2 months with scans

## 2017-02-04 ENCOUNTER — Inpatient Hospital Stay (HOSPITAL_BASED_OUTPATIENT_CLINIC_OR_DEPARTMENT_OTHER): Payer: BLUE CROSS/BLUE SHIELD | Admitting: Hematology and Oncology

## 2017-02-04 ENCOUNTER — Inpatient Hospital Stay: Payer: BLUE CROSS/BLUE SHIELD | Attending: Hematology and Oncology

## 2017-02-04 VITALS — BP 118/82 | HR 79 | Temp 98.8°F | Resp 18 | Ht 64.0 in | Wt 139.5 lb

## 2017-02-04 DIAGNOSIS — C7951 Secondary malignant neoplasm of bone: Secondary | ICD-10-CM

## 2017-02-04 DIAGNOSIS — R180 Malignant ascites: Secondary | ICD-10-CM | POA: Diagnosis not present

## 2017-02-04 DIAGNOSIS — F418 Other specified anxiety disorders: Secondary | ICD-10-CM | POA: Insufficient documentation

## 2017-02-04 DIAGNOSIS — E279 Disorder of adrenal gland, unspecified: Secondary | ICD-10-CM

## 2017-02-04 DIAGNOSIS — D61818 Other pancytopenia: Secondary | ICD-10-CM | POA: Insufficient documentation

## 2017-02-04 DIAGNOSIS — C786 Secondary malignant neoplasm of retroperitoneum and peritoneum: Secondary | ICD-10-CM | POA: Insufficient documentation

## 2017-02-04 DIAGNOSIS — G893 Neoplasm related pain (acute) (chronic): Secondary | ICD-10-CM | POA: Diagnosis not present

## 2017-02-04 DIAGNOSIS — C787 Secondary malignant neoplasm of liver and intrahepatic bile duct: Secondary | ICD-10-CM | POA: Insufficient documentation

## 2017-02-04 DIAGNOSIS — C7900 Secondary malignant neoplasm of unspecified kidney and renal pelvis: Secondary | ICD-10-CM | POA: Diagnosis not present

## 2017-02-04 DIAGNOSIS — Z5111 Encounter for antineoplastic chemotherapy: Secondary | ICD-10-CM | POA: Diagnosis not present

## 2017-02-04 DIAGNOSIS — C50919 Malignant neoplasm of unspecified site of unspecified female breast: Secondary | ICD-10-CM

## 2017-02-04 MED ORDER — FENTANYL 75 MCG/HR TD PT72: 75.0000 ug | MEDICATED_PATCH | TRANSDERMAL | 0 refills | Status: DC

## 2017-02-04 MED ORDER — FULVESTRANT 250 MG/5ML IM SOLN
500.0000 mg | Freq: Once | INTRAMUSCULAR | Status: AC
  Administered 2017-02-04: 500 mg via INTRAMUSCULAR

## 2017-02-04 MED ORDER — FULVESTRANT 250 MG/5ML IM SOLN
INTRAMUSCULAR | Status: AC
  Filled 2017-02-04: qty 5

## 2017-02-04 MED ORDER — BUPROPION HCL ER (XL) 150 MG PO TB24: 150.0000 mg | ORAL_TABLET | Freq: Every day | ORAL | 6 refills | Status: DC

## 2017-02-04 MED ORDER — LORAZEPAM 1 MG PO TABS: 1.0000 mg | ORAL_TABLET | Freq: Three times a day (TID) | ORAL | 5 refills | Status: DC

## 2017-02-04 MED FILL — fentaNYL 75 MCG/HR PT72: 75 | 75 days supply | Qty: 25 | Fill #0

## 2017-02-04 MED FILL — LORazepam 1 MG TABS: 1 | 10 days supply | Qty: 30 | Fill #0

## 2017-02-04 NOTE — Patient Instructions (Signed)

## 2017-02-04 NOTE — Progress Notes (Signed)
Patient Care Team: Olena Mater, MD as PCP - General (Internal Medicine) Elveria Rising, MD (Obstetrics and Gynecology)  DIAGNOSIS:  Encounter Diagnosis  Name Primary?  . Metastatic breast cancer ([place]) Yes    SUMMARY OF ONCOLOGIC HISTORY:   Metastatic breast cancer ([place])   01/21/2014 Initial Diagnosis    Right breast cancer status post mastectomy and axillary lymph node dissection multifocal disease 4.4 cm, 1.1 cm, 1 mm, ER 93%, PR 40-50%, HER-2 negative ratio 1.3, T2 N3 a stage IIIc diagnosed at [facility] by Dr.Katragadda; treated with adjuvant chemotherapy with before meals 4 followed by Taxotere 12 followed by radiation and tamoxifen      07/15/2016 Relapse/Recurrence    Patient presented with abdominal distention and ascites; CT 07/15/2016 revealed ascites, pleural effusion, pericardial masses, 8 mm right liver nodule, adrenal nodule; biopsy metastatic breast cancer ER/PR positive HER-2 negative; MRI pelvis 07/15/2016 pelvic mass is 5.27 minutes, 5.6 cm, right adrenal nodule 4.2 cm      07/23/2016 Miscellaneous    Paracentesis 1.7 L cytology negative, peritoneal catheter, patient claims that once or twice a week      07/26/2016 PET scan    Pelvic masses, intrarenal mass, portal caval lymph node, anterior mediastinal lymph node, several bone lesions T8 compression fracture and right femur concern for impending fracture, right pleural effusion, ascites      08/08/2016 -  Anti-estrogen oral therapy    Faslodex (loading doses given on 08/08/2016 and 08/23/2016) Palbociclib (started 08/23/2016) and Xgeva (last injection 08/12/2016)      08/10/2016 - 08/24/2016 Radiation Therapy    Radiation therapy to T8 and bilateral femurs (complication: Esophagitis): Radiation done in [place] ([facility])      09/09/2016 - 09/16/2016 Hospital Admission    Hospitalization for infected loculated ascites (MSSA SBP), pancytopenia      12/27/2016 Imaging    CT CAP:  Intra-abdominal ascites and tumor deposits mildly improved, right adrenal mass reduced in size, bone metastases stable, resolved right pleural effusion, previously enlarged right paratracheal lymph node is smaller       CHIEF COMPLIANT: Follow-up on Ibrance with the Faslodex along with Xgeva, patient is here to discuss her pain regimen  INTERVAL HISTORY: Tamara Byrd is a 52 year old with above-mentioned metastatic breast cancer who had malignant ascites and had responded very well to systemic therapy with palbociclib and Faslodex.  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  She has been trying to cut down the pain medication slowly over time.  She is currently on fentanyl 75 mcg and she would like me to give her a new prescription for that for 5 boxes.  She also needs a refill for the Ativan today.  She denies any abdominal pain.  Denies any nausea or vomiting.  REVIEW OF SYSTEMS:   Constitutional: Denies fevers, chills or abnormal weight loss Eyes: Denies blurriness of vision Ears, nose, mouth, throat, and face: Denies mucositis or sore throat Respiratory: Denies cough, dyspnea or wheezes Cardiovascular: Denies palpitation, chest discomfort Gastrointestinal:  Denies nausea, heartburn or change in bowel habits Skin: Denies abnormal skin rashes Lymphatics: Denies new lymphadenopathy or easy bruising Neurological:Denies numbness, tingling or new weaknesses Behavioral/Psych: Mood is stable, no new changes  Extremities: No lower extremity edema  All other systems were reviewed with the patient and are negative.  I have reviewed the past medical history, past surgical history, social history and family history with the patient and they are unchanged  from previous note.  ALLERGIES:  has No Known Allergies.  MEDICATIONS:  Current Outpatient Medications  Medication Sig Dispense Refill  . amphetamine-dextroamphetamine (ADDERALL) 10 MG tablet Take  1 tablet (10 mg total) by mouth daily with breakfast. 30 tablet 0  . buPROPion (WELLBUTRIN XL) 150 MG 24 hr tablet Take 1 tablet (150 mg total) by mouth daily. 30 tablet 6  . Calcium Carbonate-Vit D-Min (CALCIUM 1200) 1200-1000 MG-UNIT CHEW Chew 1 tablet by mouth daily. 30 each   . diphenhydrAMINE (BENADRYL) 25 mg capsule Take 1 capsule (25 mg total) by mouth at bedtime as needed for sleep.    . fentaNYL (DURAGESIC - DOSED MCG/HR) 75 MCG/HR Place 1 patch (75 mcg total) onto the skin every 3 (three) days. 25 patch 0  . HYDROmorphone (DILAUDID) 4 MG tablet Take 1 tablet (4 mg total) by mouth every 6 (six) hours as needed for moderate pain or severe pain. 120 tablet 0  . IBRANCE 100 MG capsule TAKE ONE CAPSULE BY MOUTH DAILY WITH BREAKFAST FOR 3 WEEKS ON, THEN 1WEEK OFF. SWALLOW WHOLE. DO NOT TAKE IF CAPSULES ARE BROKEN OR CRACKED 21 capsule 2  . LORazepam (ATIVAN) 1 MG tablet Take 1 tablet (1 mg total) by mouth every 8 (eight) hours. 30 tablet 5  . naloxegol oxalate (MOVANTIK) 12.5 MG TABS tablet Take 1 tablet (12.5 mg total) by mouth daily. 30 tablet 3  . ondansetron (ZOFRAN) 8 MG tablet Take 1 tablet (8 mg total) by mouth every 8 (eight) hours as needed for nausea or vomiting. 90 tablet 0  . polyethylene glycol (MIRALAX / GLYCOLAX) packet Take 17 g by mouth 3 (three) times daily. 90 each 6   No current facility-administered medications for this visit.     PHYSICAL EXAMINATION: ECOG PERFORMANCE STATUS: 1 - Symptomatic but completely ambulatory  Vitals:   02/04/17 1136  BP: 118/82  Pulse: 79  Resp: 18  Temp: 98.8 F (37.1 C)  SpO2: 100%   Filed Weights   02/04/17 1136  Weight: 139 lb 8 oz (63.3 kg)    GENERAL:alert, no distress and comfortable SKIN: skin color, texture, turgor are normal, no rashes or significant lesions EYES: normal, Conjunctiva are pink and non-injected, sclera clear OROPHARYNX:no exudate, no erythema and lips, buccal mucosa, and tongue normal  NECK: supple,  thyroid normal size, non-tender, without nodularity LYMPH:  no palpable lymphadenopathy in the cervical, axillary or inguinal LUNGS: clear to auscultation and percussion with normal breathing effort HEART: regular rate & rhythm and no murmurs and no lower extremity edema ABDOMEN:abdomen soft, non-tender and normal bowel sounds MUSCULOSKELETAL:no cyanosis of digits and no clubbing  NEURO: alert & oriented x 3 with fluent speech, no focal motor/sensory deficits EXTREMITIES: No lower extremity edema  LABORATORY DATA:  I have reviewed the data as listed CMP Latest Ref Rng & Units 01/07/2017 12/27/2016 11/22/2016  Glucose 70 - 140 mg/dl 71 85 79  BUN 7.0 - 26.0 mg/dL 11.5 10.4 8.8  Creatinine 0.6 - 1.1 mg/dL 0.8 0.8 0.7  Sodium 136 - 145 mEq/L 140 139 140  Potassium 3.5 - 5.1 mEq/L 4.2 4.2 4.4  Chloride 101 - 111 mmol/L - - -  CO2 22 - 29 mEq/L 29 27 28   Calcium 8.4 - 10.4 mg/dL 9.2 9.2 9.1  Total Protein 6.4 - 8.3 g/dL 7.2 7.2 7.4  Total Bilirubin 0.20 - 1.20 mg/dL 0.27 0.26 0.24  Alkaline Phos 40 - 150 U/L 58 61 66  AST 5 - 34 U/L 14 14 14  ALT 0 - 55 U/L 8 7 8     Lab Results  Component Value Date   WBC 2.5 (L) 01/07/2017   HGB 11.5 (L) 01/07/2017   HCT 34.7 (L) 01/07/2017   MCV 104.2 (H) 01/07/2017   PLT 311 01/07/2017   NEUTROABS 1.5 01/07/2017    ASSESSMENT & PLAN:  Metastatic breast cancer ([place]) Metastatic breast cancer with bone, lymph nodes, liver, pleural effusion, ascites, pelvic masses and intrarenal metastases diagnosed 07/15/2016 (Prior history of stage IIIc breast cancer December 2015 treated with mastectomy, adjuvant chemotherapy, radiation and tamoxifen)  Current treatment: Palbociclib with Faslodex and Xgeva (every 3 months) Hospitalization for MSSA spontaneous bacterial peritonitis 09/09/2016- 09/16/2016  12/27/2016: CT CAP: Intra-abdominal ascites and tumor deposits mildly improved, right adrenal mass reduced in size, bone metastases stable, resolved right  pleural effusion, previously enlarged right paratracheal lymph node is smaller  I reviewed the scans with the patient that showed improved metastatic disease.  Pain from bone metastases: Currently on fentanyl patch 75g every 72 hours along with Dilaudid.  Will change Xgeva to every 3 months  Major depression and anxiety: Patient is currently on Wellbutrin and Ativan  Pancytopenia: Stable with 100 mg of Ibrance Pain Regimen: Patient wishes to decrease her pain med usage.  She will continue with the fentanyl patches 75 mcg. Our plan is to obtain CT scans prior to her next visit in March  RTC in 2 months with scans     I spent 25 minutes talking to the patient of which more than half was spent in counseling and coordination of care.  Orders Placed This Encounter  Procedures  . CT Abdomen Pelvis W Contrast    Standing Status:   Future    Standing Expiration Date:   02/04/2018    Order Specific Question:   If indicated for the ordered procedure, I authorize the administration of contrast media per Radiology protocol    Answer:   Yes    Order Specific Question:   Is patient pregnant?    Answer:   No    Order Specific Question:   Preferred imaging location?    Answer:   [facility]    Order Specific Question:   Radiology Contrast Protocol - do NOT remove file path    Answer:   [email or web address]    Order Specific Question:   Reason for Exam additional comments    Answer:   Metastatic Breast cancer restaging  . CT Chest W Contrast    Standing Status:   Future    Standing Expiration Date:   02/04/2018    Order Specific Question:   If indicated for the ordered procedure, I authorize the administration of contrast media per Radiology protocol    Answer:   Yes    Order Specific Question:   Is patient pregnant?    Answer:   No    Order Specific Question:   Preferred imaging location?    Answer:   [facility]    Order Specific Question:    Radiology Contrast Protocol - do NOT remove file path    Answer:   [email or web address]    Order Specific Question:   Reason for Exam additional comments    Answer:   Metastatic Breast cancer restaging  . SCHEDULING COMMUNICATION INJECTION    Schedule injection appointment 15 min   The patient has a good understanding of the overall plan. she agrees with it. she will call with any problems that may develop before the next  visit here.   Harriette Ohara, MD 02/04/17

## 2017-02-07 ENCOUNTER — Inpatient Hospital Stay: Payer: BLUE CROSS/BLUE SHIELD

## 2017-02-13 ENCOUNTER — Encounter: Payer: Self-pay | Admitting: Hematology and Oncology

## 2017-02-17 ENCOUNTER — Telehealth: Payer: Self-pay | Admitting: Hematology and Oncology

## 2017-02-17 NOTE — Telephone Encounter (Signed)
Left message for patient regarding upcoming February appointment updates per 1/21 sch message

## 2017-02-24 ENCOUNTER — Other Ambulatory Visit: Payer: Self-pay

## 2017-02-24 ENCOUNTER — Encounter: Payer: Self-pay | Admitting: Hematology and Oncology

## 2017-02-24 MED ORDER — BUPROPION HCL ER (XL) 150 MG PO TB24: 300.0000 mg | ORAL_TABLET | Freq: Every day | ORAL | 0 refills | Status: DC

## 2017-03-11 ENCOUNTER — Ambulatory Visit: Payer: BLUE CROSS/BLUE SHIELD

## 2017-03-14 ENCOUNTER — Other Ambulatory Visit: Payer: Self-pay | Admitting: *Deleted

## 2017-03-14 ENCOUNTER — Inpatient Hospital Stay: Payer: BLUE CROSS/BLUE SHIELD | Attending: Hematology and Oncology

## 2017-03-14 DIAGNOSIS — C7951 Secondary malignant neoplasm of bone: Secondary | ICD-10-CM | POA: Insufficient documentation

## 2017-03-14 DIAGNOSIS — C787 Secondary malignant neoplasm of liver and intrahepatic bile duct: Secondary | ICD-10-CM | POA: Insufficient documentation

## 2017-03-14 DIAGNOSIS — Z5111 Encounter for antineoplastic chemotherapy: Secondary | ICD-10-CM | POA: Diagnosis present

## 2017-03-14 DIAGNOSIS — C786 Secondary malignant neoplasm of retroperitoneum and peritoneum: Secondary | ICD-10-CM | POA: Diagnosis not present

## 2017-03-14 DIAGNOSIS — C50919 Malignant neoplasm of unspecified site of unspecified female breast: Secondary | ICD-10-CM | POA: Diagnosis present

## 2017-03-14 DIAGNOSIS — C7900 Secondary malignant neoplasm of unspecified kidney and renal pelvis: Secondary | ICD-10-CM | POA: Diagnosis not present

## 2017-03-14 MED ORDER — FULVESTRANT 250 MG/5ML IM SOLN
500.0000 mg | Freq: Once | INTRAMUSCULAR | Status: AC
  Administered 2017-03-14: 500 mg via INTRAMUSCULAR

## 2017-03-14 MED ORDER — FULVESTRANT 250 MG/5ML IM SOLN
INTRAMUSCULAR | Status: AC
  Filled 2017-03-14: qty 10

## 2017-03-14 MED ORDER — HYDROMORPHONE HCL 4 MG PO TABS: 4.0000 mg | ORAL_TABLET | Freq: Four times a day (QID) | ORAL | 0 refills | Status: DC | PRN

## 2017-03-14 MED ORDER — FENTANYL 75 MCG/HR TD PT72: 75.0000 ug | MEDICATED_PATCH | TRANSDERMAL | 0 refills | Status: DC

## 2017-03-14 MED ORDER — LORAZEPAM 1 MG PO TABS: 1.0000 mg | ORAL_TABLET | Freq: Three times a day (TID) | ORAL | 5 refills | Status: DC

## 2017-03-14 MED FILL — LORazepam 1 MG TABS: 1 | 10 days supply | Qty: 30 | Fill #0

## 2017-03-14 MED FILL — HYDROmorphone HCL 4 MG TABS: 4 | 30 days supply | Qty: 120 | Fill #0

## 2017-03-14 NOTE — Patient Instructions (Signed)

## 2017-03-20 ENCOUNTER — Other Ambulatory Visit: Payer: Self-pay | Admitting: Hematology and Oncology

## 2017-03-25 ENCOUNTER — Ambulatory Visit (HOSPITAL_COMMUNITY)
Admission: RE | Admit: 2017-03-25 | Discharge: 2017-03-25 | Disposition: A | Discharge status: Home / Self Care | Payer: Managed Care, Other (non HMO) | Source: Ambulatory Visit | Attending: Hematology and Oncology | Admitting: Hematology and Oncology

## 2017-03-25 ENCOUNTER — Encounter (HOSPITAL_COMMUNITY): Payer: Self-pay

## 2017-03-25 ENCOUNTER — Telehealth: Payer: Self-pay | Admitting: *Deleted

## 2017-03-25 ENCOUNTER — Encounter: Admit: 2017-03-25 | Discharge: 2017-03-26 | Payer: Commercial Managed Care - HMO

## 2017-03-25 DIAGNOSIS — R69 Illness, unspecified: Principal | ICD-10-CM

## 2017-03-25 DIAGNOSIS — C50919 Malignant neoplasm of unspecified site of unspecified female breast: Secondary | ICD-10-CM | POA: Diagnosis present

## 2017-03-25 DIAGNOSIS — C7951 Secondary malignant neoplasm of bone: Secondary | ICD-10-CM | POA: Insufficient documentation

## 2017-03-25 MED ORDER — IOPAMIDOL (ISOVUE-300) INJECTION 61%
INTRAVENOUS | Status: AC
  Filled 2017-03-25: qty 100

## 2017-03-25 MED ORDER — IOPAMIDOL (ISOVUE-300) INJECTION 61%
100.0000 mL | Freq: Once | INTRAVENOUS | Status: AC | PRN
  Administered 2017-03-25: 100 mL via INTRAVENOUS

## 2017-03-25 MED ORDER — SODIUM CHLORIDE 0.9 % IJ SOLN
INTRAMUSCULAR | Status: AC
  Filled 2017-03-25: qty 50

## 2017-03-25 NOTE — Telephone Encounter (Signed)
This RN received call from [place] at the [facility] - pt is there now with script for fentanyl 75 mcg/hr  dispense 75 patches with instructions to place 1 patch q 3 days.  Pt obtained 75 patches on 02/04/2017 which is a 75 day supply there fore pt not due for refill at this time.  Pt is informing Delilah Shan " sometimes my patch falls off and I have to put a new one on "  This RN reviewed chart - and informed Kendall above prescription cannot be authorized for early refill due to Dr Yvette Rack is out of the office today - if pt is out of meds and is in uncontrolled pain she will need to proceed to the ER.  Kendall verbalized understanding.

## 2017-03-28 ENCOUNTER — Encounter: Payer: Self-pay | Admitting: Hematology and Oncology

## 2017-03-29 ENCOUNTER — Encounter: Admit: 2017-03-29 | Discharge: 2017-03-29 | Payer: Commercial Managed Care - HMO

## 2017-04-01 ENCOUNTER — Telehealth: Payer: Self-pay | Admitting: Hematology and Oncology

## 2017-04-01 NOTE — Telephone Encounter (Signed)
FAXED RECORDS TO [place] ID [id number]

## 2017-04-04 ENCOUNTER — Encounter: Payer: Self-pay | Admitting: Hematology and Oncology

## 2017-04-06 ENCOUNTER — Encounter: Admit: 2017-04-06 | Discharge: 2017-04-06 | Payer: Commercial Managed Care - HMO

## 2017-04-06 DIAGNOSIS — R69 Illness, unspecified: Principal | ICD-10-CM

## 2017-04-08 ENCOUNTER — Ambulatory Visit: Payer: BLUE CROSS/BLUE SHIELD | Admitting: Hematology and Oncology

## 2017-04-08 ENCOUNTER — Other Ambulatory Visit: Payer: BLUE CROSS/BLUE SHIELD

## 2017-04-08 ENCOUNTER — Ambulatory Visit: Payer: BLUE CROSS/BLUE SHIELD

## 2017-04-11 ENCOUNTER — Encounter: Admit: 2017-04-11 | Discharge: 2017-04-11 | Payer: Commercial Managed Care - HMO

## 2017-04-11 DIAGNOSIS — K5903 Drug induced constipation: ICD-10-CM

## 2017-04-11 DIAGNOSIS — Z170 Estrogen receptor positive status [ER+]: ICD-10-CM

## 2017-04-11 DIAGNOSIS — C50919 Malignant neoplasm of unspecified site of unspecified female breast: Principal | ICD-10-CM

## 2017-04-11 DIAGNOSIS — Z87311 Personal history of (healed) other pathological fracture: ICD-10-CM

## 2017-04-11 DIAGNOSIS — C50911 Malignant neoplasm of unspecified site of right female breast: Principal | ICD-10-CM

## 2017-04-11 DIAGNOSIS — Z789 Other specified health status: ICD-10-CM

## 2017-04-11 DIAGNOSIS — K5900 Constipation, unspecified: ICD-10-CM

## 2017-04-11 DIAGNOSIS — F3289 Other specified depressive episodes: ICD-10-CM

## 2017-04-11 DIAGNOSIS — G893 Neoplasm related pain (acute) (chronic): ICD-10-CM

## 2017-04-11 DIAGNOSIS — K652 Spontaneous bacterial peritonitis: ICD-10-CM

## 2017-04-11 DIAGNOSIS — F329 Major depressive disorder, single episode, unspecified: ICD-10-CM

## 2017-04-11 DIAGNOSIS — G8929 Other chronic pain: ICD-10-CM

## 2017-04-11 LAB — CBC AND DIFF
Lab: 0 10*3/uL (ref 0–0.45)
Lab: 1 % (ref >60)
Lab: 1 10*3/uL (ref 1.0–4.8)
Lab: 1.2 10*3/uL — ABNORMAL LOW (ref 1.8–7.0)
Lab: 10 % (ref 4–12)
Lab: 13 % (ref 11–15)
Lab: 2 % (ref >60)
Lab: 2.5 10*3/uL — ABNORMAL LOW (ref 4.5–11.0)
Lab: 3.2 M/UL — ABNORMAL LOW (ref 4.0–5.0)
Lab: 341 K/UL (ref 150–400)
Lab: 39 % — ABNORMAL LOW (ref 24–44)
Lab: 48 % (ref 41–77)
Lab: 6.7 FL — ABNORMAL LOW (ref 7–11)

## 2017-04-11 MED ORDER — DENOSUMAB 120 MG/1.7 ML (70 MG/ML) SC SOLN
120 mg | Freq: Once | SUBCUTANEOUS | 0 refills | Status: CN
Start: 2017-04-11 — End: ?

## 2017-04-11 MED ORDER — OXYCODONE 10 MG PO TAB
10 mg | ORAL_TABLET | ORAL | 0 refills | 6.00000 days | Status: AC | PRN
Start: 2017-04-11 — End: 2017-08-26

## 2017-04-11 MED ORDER — POLYETHYLENE GLYCOL 3350 17 GRAM PO PWPK
17 g | Freq: Every day | ORAL | 1 refills | 22.00000 days | Status: AC
Start: 2017-04-11 — End: 2017-04-20

## 2017-04-11 MED ORDER — FULVESTRANT 250 MG/5 ML IM SYRG
500 mg | Freq: Once | INTRAMUSCULAR | 0 refills | Status: CN
Start: 2017-04-11 — End: ?

## 2017-04-11 MED ORDER — CALCIUM CARBONATE-VITAMIN D3 500 MG(1,250MG) -200 UNIT PO TAB
1 | Freq: Two times a day (BID) | ORAL | 0 refills | Status: SS
Start: 2017-04-11 — End: 2018-05-22

## 2017-04-11 MED ORDER — OXYCODONE SR 40 MG PO 12HR TABLET
40 mg | ORAL_TABLET | Freq: Two times a day (BID) | ORAL | 0 refills | 6.00000 days | Status: AC
Start: 2017-04-11 — End: 2017-05-06

## 2017-04-12 ENCOUNTER — Encounter: Admit: 2017-04-12 | Discharge: 2017-04-12 | Payer: Commercial Managed Care - HMO

## 2017-04-12 DIAGNOSIS — C50919 Malignant neoplasm of unspecified site of unspecified female breast: Principal | ICD-10-CM

## 2017-04-12 LAB — COMPREHENSIVE METABOLIC PANEL
Lab: 137 MMOL/L (ref 137–147)
Lab: 3.7 MMOL/L (ref 3.5–5.1)

## 2017-04-12 MED ORDER — FULVESTRANT 250 MG/5 ML IM SYRG
500 mg | Freq: Once | INTRAMUSCULAR | 0 refills | Status: CN
Start: 2017-04-12 — End: ?

## 2017-04-12 MED ORDER — PALBOCICLIB 100 MG PO CAP
ORAL_CAPSULE | Freq: Every day | ORAL | 0 refills | 28.00000 days | Status: DC
Start: 2017-04-12 — End: 2017-04-13

## 2017-04-13 ENCOUNTER — Encounter: Admit: 2017-04-13 | Discharge: 2017-04-13 | Payer: Commercial Managed Care - HMO

## 2017-04-13 DIAGNOSIS — C50919 Malignant neoplasm of unspecified site of unspecified female breast: Principal | ICD-10-CM

## 2017-04-13 LAB — CA-27.29: Lab: 25 U/mL

## 2017-04-13 MED ORDER — PALBOCICLIB 100 MG PO CAP
ORAL_CAPSULE | Freq: Every day | ORAL | 0 refills | 28.00000 days | Status: AC
Start: 2017-04-13 — End: 2017-04-20

## 2017-04-13 MED ORDER — MORPHINE 60 MG PO TBER
60 mg | ORAL_TABLET | Freq: Two times a day (BID) | ORAL | 0 refills | 7.00000 days | Status: AC
Start: 2017-04-13 — End: 2017-05-16

## 2017-04-13 MED ORDER — PALBOCICLIB 100 MG PO CAP
ORAL_CAPSULE | Freq: Every day | ORAL | 0 refills | 28.00000 days | Status: DC
Start: 2017-04-13 — End: 2017-04-13

## 2017-04-14 ENCOUNTER — Telehealth: Payer: Self-pay | Admitting: *Deleted

## 2017-04-14 NOTE — Telephone Encounter (Signed)
Faxed ROI to [facility] 04/14/17 @ 12:12pm; release id # [id number]

## 2017-04-15 ENCOUNTER — Telehealth: Payer: Self-pay | Admitting: *Deleted

## 2017-04-15 ENCOUNTER — Encounter: Admit: 2017-04-15 | Discharge: 2017-04-15 | Payer: Commercial Managed Care - HMO

## 2017-04-15 NOTE — Telephone Encounter (Signed)
Faxed ROI to [facility] 04/15/17 @ 11:27am; release ID # [id number]

## 2017-04-20 ENCOUNTER — Encounter: Admit: 2017-04-20 | Discharge: 2017-04-20 | Payer: Commercial Managed Care - HMO

## 2017-04-20 DIAGNOSIS — C50919 Malignant neoplasm of unspecified site of unspecified female breast: Principal | ICD-10-CM

## 2017-04-20 MED ORDER — POLYETHYLENE GLYCOL 3350 17 GRAM PO PWPK
17 g | Freq: Every day | ORAL | 1 refills | 18.00000 days | Status: AC
Start: 2017-04-20 — End: 2017-05-06

## 2017-04-20 MED ORDER — PALBOCICLIB 100 MG PO CAP
ORAL_CAPSULE | Freq: Every day | ORAL | 0 refills | 28.00000 days | Status: AC
Start: 2017-04-20 — End: 2017-05-20

## 2017-04-25 ENCOUNTER — Encounter: Admit: 2017-04-25 | Discharge: 2017-04-25 | Payer: Commercial Managed Care - HMO

## 2017-04-27 ENCOUNTER — Other Ambulatory Visit: Payer: Self-pay | Admitting: Hematology and Oncology

## 2017-04-29 ENCOUNTER — Encounter: Admit: 2017-04-29 | Discharge: 2017-04-29 | Payer: Commercial Managed Care - HMO

## 2017-04-29 MED ORDER — BUPROPION SR 150 MG PO TBSR
300 mg | ORAL_TABLET | Freq: Every day | ORAL | 0 refills | Status: SS
Start: 2017-04-29 — End: 2018-03-13

## 2017-05-05 MED ORDER — FULVESTRANT 250 MG/5 ML IM SYRG
500 mg | Freq: Once | INTRAMUSCULAR | 0 refills | Status: CP
Start: 2017-05-05 — End: ?
  Administered 2017-05-06: 21:00:00 500 mg via INTRAMUSCULAR

## 2017-05-05 MED ORDER — FULVESTRANT 250 MG/5 ML IM SYRG
500 mg | Freq: Once | INTRAMUSCULAR | 0 refills | Status: CN
Start: 2017-05-05 — End: ?

## 2017-05-05 MED ORDER — DENOSUMAB 120 MG/1.7 ML (70 MG/ML) SC SOLN
120 mg | Freq: Once | SUBCUTANEOUS | 0 refills | Status: CP
Start: 2017-05-05 — End: ?
  Administered 2017-05-06: 21:00:00 120 mg via SUBCUTANEOUS

## 2017-05-06 ENCOUNTER — Encounter: Admit: 2017-05-06 | Discharge: 2017-05-06 | Payer: Commercial Managed Care - HMO

## 2017-05-06 ENCOUNTER — Encounter: Admit: 2017-05-06 | Discharge: 2017-05-07 | Payer: Commercial Managed Care - HMO

## 2017-05-06 DIAGNOSIS — C50911 Malignant neoplasm of unspecified site of right female breast: Principal | ICD-10-CM

## 2017-05-06 DIAGNOSIS — Z170 Estrogen receptor positive status [ER+]: ICD-10-CM

## 2017-05-06 DIAGNOSIS — B029 Zoster without complications: ICD-10-CM

## 2017-05-06 DIAGNOSIS — J90 Pleural effusion, not elsewhere classified: ICD-10-CM

## 2017-05-06 DIAGNOSIS — Z9011 Acquired absence of right breast and nipple: ICD-10-CM

## 2017-05-06 DIAGNOSIS — G893 Neoplasm related pain (acute) (chronic): ICD-10-CM

## 2017-05-06 DIAGNOSIS — Z79818 Long term (current) use of other agents affecting estrogen receptors and estrogen levels: ICD-10-CM

## 2017-05-06 DIAGNOSIS — D72819 Decreased white blood cell count, unspecified: ICD-10-CM

## 2017-05-06 DIAGNOSIS — C787 Secondary malignant neoplasm of liver and intrahepatic bile duct: ICD-10-CM

## 2017-05-06 DIAGNOSIS — K5900 Constipation, unspecified: ICD-10-CM

## 2017-05-06 DIAGNOSIS — C50919 Malignant neoplasm of unspecified site of unspecified female breast: ICD-10-CM

## 2017-05-06 DIAGNOSIS — C7900 Secondary malignant neoplasm of unspecified kidney and renal pelvis: ICD-10-CM

## 2017-05-06 DIAGNOSIS — F329 Major depressive disorder, single episode, unspecified: ICD-10-CM

## 2017-05-06 DIAGNOSIS — R1900 Intra-abdominal and pelvic swelling, mass and lump, unspecified site: ICD-10-CM

## 2017-05-06 DIAGNOSIS — C7951 Secondary malignant neoplasm of bone: ICD-10-CM

## 2017-05-06 DIAGNOSIS — K5903 Drug induced constipation: ICD-10-CM

## 2017-05-06 DIAGNOSIS — R188 Other ascites: ICD-10-CM

## 2017-05-06 DIAGNOSIS — R7989 Other specified abnormal findings of blood chemistry: ICD-10-CM

## 2017-05-06 DIAGNOSIS — T40605A Adverse effect of unspecified narcotics, initial encounter: ICD-10-CM

## 2017-05-06 LAB — CBC AND DIFF
Lab: 0 10*3/uL (ref 0–0.45)
Lab: 0.1 10*3/uL (ref 0–0.20)
Lab: 0.4 10*3/uL (ref 0–0.80)
Lab: 0.9 10*3/uL — ABNORMAL LOW (ref 1.0–4.8)
Lab: 1 % (ref 0–5)
Lab: 1.7 10*3/uL — ABNORMAL LOW (ref 1.8–7.0)
Lab: 104 FL — ABNORMAL HIGH (ref 80–100)
Lab: 12 g/dL (ref 12.0–15.0)
Lab: 13 % (ref 11–15)
Lab: 13 % — ABNORMAL HIGH (ref 4–12)
Lab: 29 % (ref 24–44)
Lab: 3 % — ABNORMAL HIGH (ref 0–2)
Lab: 3.1 10*3/uL — ABNORMAL LOW (ref 4.5–11.0)
Lab: 3.5 M/UL — ABNORMAL LOW (ref 4.0–5.0)
Lab: 34 g/dL (ref 32.0–36.0)
Lab: 35 pg — ABNORMAL HIGH (ref 26–34)
Lab: 36 % (ref 36–45)
Lab: 421 10*3/uL — ABNORMAL HIGH (ref 150–400)
Lab: 54 % (ref 41–77)
Lab: 7.1 FL (ref 7–11)

## 2017-05-06 MED ORDER — POLYETHYLENE GLYCOL 3350 17 GRAM PO PWPK
Freq: Three times a day (TID) | ORAL | 1 refills | 18.00000 days | Status: AC | PRN
Start: 2017-05-06 — End: 2017-06-03

## 2017-05-07 LAB — COMPREHENSIVE METABOLIC PANEL
Lab: 17 U/L (ref 7–56)
Lab: 20 U/L (ref 7–40)
Lab: 29 MMOL/L (ref 21–30)
Lab: >60 mL/min (ref >60)
Lab: >60 mL/min (ref >60)
Lab: 4 (ref 3–12)

## 2017-05-10 ENCOUNTER — Encounter: Admit: 2017-05-10 | Discharge: 2017-05-10 | Payer: Commercial Managed Care - HMO

## 2017-05-16 ENCOUNTER — Encounter: Admit: 2017-05-16 | Discharge: 2017-05-16 | Payer: Commercial Managed Care - HMO

## 2017-05-16 MED ORDER — MORPHINE 60 MG PO TBER
60 mg | ORAL_TABLET | Freq: Two times a day (BID) | ORAL | 0 refills | 7.00000 days | Status: AC
Start: 2017-05-16 — End: 2017-06-03

## 2017-05-17 ENCOUNTER — Encounter: Admit: 2017-05-17 | Discharge: 2017-05-17 | Payer: Commercial Managed Care - HMO

## 2017-05-17 DIAGNOSIS — C50919 Malignant neoplasm of unspecified site of unspecified female breast: Principal | ICD-10-CM

## 2017-05-20 ENCOUNTER — Encounter: Admit: 2017-05-20 | Discharge: 2017-05-20 | Payer: Commercial Managed Care - HMO

## 2017-05-20 DIAGNOSIS — C50919 Malignant neoplasm of unspecified site of unspecified female breast: Principal | ICD-10-CM

## 2017-05-20 MED ORDER — PALBOCICLIB 100 MG PO CAP
ORAL_CAPSULE | Freq: Every day | ORAL | 0 refills | 28.00000 days | Status: AC
Start: 2017-05-20 — End: 2017-06-17

## 2017-05-20 MED ORDER — IBRANCE 100 MG PO CAP
ORAL_CAPSULE | Freq: Every day | 11 refills
Start: 2017-05-20 — End: ?

## 2017-06-02 MED ORDER — FULVESTRANT 250 MG/5 ML IM SYRG
500 mg | Freq: Once | INTRAMUSCULAR | 0 refills | Status: CP
Start: 2017-06-02 — End: ?
  Administered 2017-06-03: 20:00:00 500 mg via INTRAMUSCULAR

## 2017-06-03 ENCOUNTER — Encounter: Admit: 2017-06-03 | Discharge: 2017-06-03 | Payer: Commercial Managed Care - HMO

## 2017-06-03 ENCOUNTER — Encounter: Admit: 2017-06-03 | Discharge: 2017-06-04 | Payer: Commercial Managed Care - HMO

## 2017-06-03 DIAGNOSIS — Z79818 Long term (current) use of other agents affecting estrogen receptors and estrogen levels: ICD-10-CM

## 2017-06-03 DIAGNOSIS — Z853 Personal history of malignant neoplasm of breast: ICD-10-CM

## 2017-06-03 DIAGNOSIS — F3289 Other specified depressive episodes: ICD-10-CM

## 2017-06-03 DIAGNOSIS — C50919 Malignant neoplasm of unspecified site of unspecified female breast: Principal | ICD-10-CM

## 2017-06-03 DIAGNOSIS — K652 Spontaneous bacterial peritonitis: ICD-10-CM

## 2017-06-03 DIAGNOSIS — C7900 Secondary malignant neoplasm of unspecified kidney and renal pelvis: ICD-10-CM

## 2017-06-03 DIAGNOSIS — C782 Secondary malignant neoplasm of pleura: ICD-10-CM

## 2017-06-03 DIAGNOSIS — K5903 Drug induced constipation: ICD-10-CM

## 2017-06-03 DIAGNOSIS — C787 Secondary malignant neoplasm of liver and intrahepatic bile duct: ICD-10-CM

## 2017-06-03 DIAGNOSIS — G893 Neoplasm related pain (acute) (chronic): ICD-10-CM

## 2017-06-03 DIAGNOSIS — F329 Major depressive disorder, single episode, unspecified: ICD-10-CM

## 2017-06-03 DIAGNOSIS — C50911 Malignant neoplasm of unspecified site of right female breast: Principal | ICD-10-CM

## 2017-06-03 DIAGNOSIS — Z87311 Personal history of (healed) other pathological fracture: ICD-10-CM

## 2017-06-03 DIAGNOSIS — Z9011 Acquired absence of right breast and nipple: ICD-10-CM

## 2017-06-03 DIAGNOSIS — Z87891 Personal history of nicotine dependence: ICD-10-CM

## 2017-06-03 DIAGNOSIS — C7951 Secondary malignant neoplasm of bone: ICD-10-CM

## 2017-06-03 DIAGNOSIS — Z170 Estrogen receptor positive status [ER+]: ICD-10-CM

## 2017-06-03 DIAGNOSIS — G8929 Other chronic pain: ICD-10-CM

## 2017-06-03 DIAGNOSIS — K5900 Constipation, unspecified: ICD-10-CM

## 2017-06-03 DIAGNOSIS — Z789 Other specified health status: ICD-10-CM

## 2017-06-03 LAB — COMPREHENSIVE METABOLIC PANEL
Lab: 0.7 mg/dL (ref 0.4–1.00)
Lab: 106 MMOL/L (ref 98–110)
Lab: 12 mg/dL — ABNORMAL HIGH (ref 7–25)
Lab: 141 MMOL/L — ABNORMAL LOW (ref 137–147)
Lab: 3.9 MMOL/L (ref 3.5–5.1)
Lab: 7 g/dL (ref 6.0–8.0)
Lab: 84 mg/dL — ABNORMAL HIGH (ref 70–100)
Lab: 9.4 mg/dL (ref 8.5–10.6)

## 2017-06-03 LAB — CBC AND DIFF: Lab: 2.3 10*3/uL — ABNORMAL LOW (ref 4.5–11.0)

## 2017-06-03 MED ORDER — MORPHINE 60 MG PO TBER
60 mg | ORAL_TABLET | Freq: Two times a day (BID) | ORAL | 0 refills | 7.00000 days | Status: AC
Start: 2017-06-03 — End: 2017-07-15

## 2017-06-03 MED ORDER — POLYETHYLENE GLYCOL 3350 17 GRAM PO PWPK
Freq: Three times a day (TID) | ORAL | 1 refills | 22.00000 days | Status: AC | PRN
Start: 2017-06-03 — End: ?

## 2017-06-03 MED ORDER — ZOLEDRONIC AC-MANNITOL-0.9NACL 4 MG/100 ML IV PGBK
4 mg | Freq: Once | INTRAVENOUS | 0 refills | Status: CN
Start: 2017-06-03 — End: ?

## 2017-06-06 ENCOUNTER — Encounter: Admit: 2017-06-06 | Discharge: 2017-06-06 | Payer: Commercial Managed Care - HMO

## 2017-06-06 MED ORDER — PANTOPRAZOLE 40 MG PO TBEC
40 mg | ORAL_TABLET | Freq: Every day | ORAL | 1 refills | 90.00000 days | Status: AC | PRN
Start: 2017-06-06 — End: 2017-10-05

## 2017-06-16 ENCOUNTER — Encounter: Admit: 2017-06-16 | Discharge: 2017-06-16 | Payer: Commercial Managed Care - HMO

## 2017-06-16 DIAGNOSIS — C50919 Malignant neoplasm of unspecified site of unspecified female breast: Principal | ICD-10-CM

## 2017-06-16 MED ORDER — IBRANCE 100 MG PO CAP
ORAL_CAPSULE | Freq: Every day | 11 refills
Start: 2017-06-16 — End: ?

## 2017-06-17 ENCOUNTER — Encounter: Admit: 2017-06-17 | Discharge: 2017-06-17 | Payer: Commercial Managed Care - HMO

## 2017-06-17 DIAGNOSIS — C50919 Malignant neoplasm of unspecified site of unspecified female breast: Principal | ICD-10-CM

## 2017-06-17 DIAGNOSIS — F330 Major depressive disorder, recurrent, mild: ICD-10-CM

## 2017-06-17 DIAGNOSIS — F17201 Nicotine dependence, unspecified, in remission: ICD-10-CM

## 2017-06-17 MED ORDER — PALBOCICLIB 100 MG PO CAP
ORAL_CAPSULE | Freq: Every day | ORAL | 0 refills | 28.00000 days | Status: AC
Start: 2017-06-17 — End: 2017-07-22

## 2017-06-20 ENCOUNTER — Encounter: Admit: 2017-06-20 | Discharge: 2017-06-20 | Payer: Commercial Managed Care - HMO

## 2017-06-21 MED ORDER — BUPROPION SR 150 MG PO TBSR
ORAL_TABLET | Freq: Every day | 0 refills
Start: 2017-06-21 — End: ?

## 2017-06-22 ENCOUNTER — Encounter: Admit: 2017-06-22 | Discharge: 2017-06-22 | Payer: Commercial Managed Care - HMO

## 2017-06-22 DIAGNOSIS — G8929 Other chronic pain: ICD-10-CM

## 2017-06-22 DIAGNOSIS — K5900 Constipation, unspecified: ICD-10-CM

## 2017-06-22 DIAGNOSIS — K652 Spontaneous bacterial peritonitis: ICD-10-CM

## 2017-06-22 DIAGNOSIS — F329 Major depressive disorder, single episode, unspecified: ICD-10-CM

## 2017-06-22 DIAGNOSIS — C50919 Malignant neoplasm of unspecified site of unspecified female breast: Principal | ICD-10-CM

## 2017-06-22 DIAGNOSIS — Z789 Other specified health status: ICD-10-CM

## 2017-06-22 DIAGNOSIS — Z87311 Personal history of (healed) other pathological fracture: ICD-10-CM

## 2017-06-22 MED ORDER — IBRANCE 100 MG PO CAP
ORAL_CAPSULE | Freq: Every day | 11 refills
Start: 2017-06-22 — End: ?

## 2017-06-23 ENCOUNTER — Encounter: Admit: 2017-06-23 | Discharge: 2017-06-23 | Payer: Commercial Managed Care - HMO

## 2017-06-23 MED ORDER — SERTRALINE 50 MG PO TAB
50 mg | ORAL_TABLET | Freq: Every day | ORAL | 0 refills | Status: AC
Start: 2017-06-23 — End: 2017-07-25

## 2017-06-27 ENCOUNTER — Encounter: Admit: 2017-06-27 | Discharge: 2017-07-10 | Payer: Commercial Managed Care - HMO

## 2017-06-27 MED ORDER — IOHEXOL 240 MG IODINE/ML IV SOLN
150 mL | Freq: Once | INTRAVENOUS | 0 refills | Status: CP
Start: 2017-06-27 — End: ?
  Administered 2017-06-27: 20:00:00 150 mL via INTRAVENOUS

## 2017-06-28 ENCOUNTER — Encounter: Admit: 2017-06-28 | Discharge: 2017-06-28 | Payer: Commercial Managed Care - HMO

## 2017-06-30 MED ORDER — FULVESTRANT 250 MG/5 ML IM SYRG
500 mg | Freq: Once | INTRAMUSCULAR | 0 refills | Status: CP
Start: 2017-06-30 — End: ?
  Administered 2017-07-01: 17:00:00 500 mg via INTRAMUSCULAR

## 2017-07-01 ENCOUNTER — Encounter: Admit: 2017-07-01 | Discharge: 2017-07-01 | Payer: Commercial Managed Care - HMO

## 2017-07-01 ENCOUNTER — Encounter: Admit: 2017-07-01 | Discharge: 2017-07-02 | Payer: Commercial Managed Care - HMO

## 2017-07-01 DIAGNOSIS — D701 Agranulocytosis secondary to cancer chemotherapy: ICD-10-CM

## 2017-07-01 DIAGNOSIS — Z87891 Personal history of nicotine dependence: ICD-10-CM

## 2017-07-01 DIAGNOSIS — K652 Spontaneous bacterial peritonitis: ICD-10-CM

## 2017-07-01 DIAGNOSIS — C7951 Secondary malignant neoplasm of bone: ICD-10-CM

## 2017-07-01 DIAGNOSIS — C7900 Secondary malignant neoplasm of unspecified kidney and renal pelvis: ICD-10-CM

## 2017-07-01 DIAGNOSIS — C50911 Malignant neoplasm of unspecified site of right female breast: Principal | ICD-10-CM

## 2017-07-01 DIAGNOSIS — Z9011 Acquired absence of right breast and nipple: ICD-10-CM

## 2017-07-01 DIAGNOSIS — Z79818 Long term (current) use of other agents affecting estrogen receptors and estrogen levels: ICD-10-CM

## 2017-07-01 DIAGNOSIS — G8929 Other chronic pain: ICD-10-CM

## 2017-07-01 DIAGNOSIS — C50919 Malignant neoplasm of unspecified site of unspecified female breast: Principal | ICD-10-CM

## 2017-07-01 DIAGNOSIS — K5903 Drug induced constipation: ICD-10-CM

## 2017-07-01 DIAGNOSIS — F329 Major depressive disorder, single episode, unspecified: ICD-10-CM

## 2017-07-01 DIAGNOSIS — Z170 Estrogen receptor positive status [ER+]: ICD-10-CM

## 2017-07-01 DIAGNOSIS — C787 Secondary malignant neoplasm of liver and intrahepatic bile duct: ICD-10-CM

## 2017-07-01 DIAGNOSIS — F3289 Other specified depressive episodes: ICD-10-CM

## 2017-07-01 DIAGNOSIS — T451X5A Adverse effect of antineoplastic and immunosuppressive drugs, initial encounter: ICD-10-CM

## 2017-07-01 DIAGNOSIS — G893 Neoplasm related pain (acute) (chronic): ICD-10-CM

## 2017-07-01 DIAGNOSIS — C782 Secondary malignant neoplasm of pleura: ICD-10-CM

## 2017-07-01 DIAGNOSIS — Z789 Other specified health status: ICD-10-CM

## 2017-07-01 DIAGNOSIS — K5900 Constipation, unspecified: ICD-10-CM

## 2017-07-01 DIAGNOSIS — Z87311 Personal history of (healed) other pathological fracture: ICD-10-CM

## 2017-07-01 LAB — COMPREHENSIVE METABOLIC PANEL
Lab: 108 MMOL/L — ABNORMAL LOW (ref >60)
Lab: 140 MMOL/L — ABNORMAL LOW (ref 137–147)
Lab: 15 U/L — ABNORMAL HIGH (ref 7–40)
Lab: 26 MMOL/L (ref 21–30)
Lab: 4.3 MMOL/L — ABNORMAL HIGH (ref 3.5–5.1)
Lab: 51 U/L (ref 25–110)
Lab: 6 10*3/uL — ABNORMAL LOW (ref 3–12)
Lab: 7 U/L (ref 7–56)
Lab: 79 mg/dL — ABNORMAL HIGH (ref >60)
Lab: >60 mL/min (ref >60)

## 2017-07-01 LAB — CBC AND DIFF
Lab: 0 10*3/uL (ref 0–0.20)
Lab: 0 10*3/uL (ref 0–0.45)
Lab: 1.9 10*3/uL — ABNORMAL LOW (ref 4.5–11.0)

## 2017-07-01 LAB — THYROID STIMULATING HORMONE-TSH: Lab: 2.8 uU/mL (ref 0.35–5.00)

## 2017-07-01 MED ORDER — OXYCODONE 10 MG PO TAB
10 mg | ORAL_TABLET | ORAL | 0 refills | 6.00000 days | Status: AC | PRN
Start: 2017-07-01 — End: 2017-08-26

## 2017-07-04 LAB — CBC AND DIFF

## 2017-07-08 ENCOUNTER — Encounter: Admit: 2017-07-08 | Discharge: 2017-07-08 | Payer: Commercial Managed Care - HMO

## 2017-07-08 DIAGNOSIS — C50911 Malignant neoplasm of unspecified site of right female breast: Principal | ICD-10-CM

## 2017-07-08 DIAGNOSIS — D701 Agranulocytosis secondary to cancer chemotherapy: Principal | ICD-10-CM

## 2017-07-10 DIAGNOSIS — Z9011 Acquired absence of right breast and nipple: ICD-10-CM

## 2017-07-10 DIAGNOSIS — Z170 Estrogen receptor positive status [ER+]: ICD-10-CM

## 2017-07-10 DIAGNOSIS — R1903 Right lower quadrant abdominal swelling, mass and lump: ICD-10-CM

## 2017-07-10 DIAGNOSIS — C50911 Malignant neoplasm of unspecified site of right female breast: Principal | ICD-10-CM

## 2017-07-10 DIAGNOSIS — C782 Secondary malignant neoplasm of pleura: ICD-10-CM

## 2017-07-10 DIAGNOSIS — C7951 Secondary malignant neoplasm of bone: ICD-10-CM

## 2017-07-10 DIAGNOSIS — E279 Disorder of adrenal gland, unspecified: ICD-10-CM

## 2017-07-10 DIAGNOSIS — K3189 Other diseases of stomach and duodenum: ICD-10-CM

## 2017-07-10 DIAGNOSIS — C787 Secondary malignant neoplasm of liver and intrahepatic bile duct: ICD-10-CM

## 2017-07-10 DIAGNOSIS — C7900 Secondary malignant neoplasm of unspecified kidney and renal pelvis: ICD-10-CM

## 2017-07-13 ENCOUNTER — Encounter: Admit: 2017-07-13 | Discharge: 2017-07-13 | Payer: Commercial Managed Care - HMO

## 2017-07-13 DIAGNOSIS — C50919 Malignant neoplasm of unspecified site of unspecified female breast: Principal | ICD-10-CM

## 2017-07-15 ENCOUNTER — Encounter: Admit: 2017-07-15 | Discharge: 2017-07-15 | Payer: Commercial Managed Care - HMO

## 2017-07-15 LAB — CBC AND DIFF

## 2017-07-15 MED ORDER — MORPHINE 60 MG PO TBER
60 mg | ORAL_TABLET | Freq: Two times a day (BID) | ORAL | 0 refills | 7.00000 days | Status: AC
Start: 2017-07-15 — End: 2017-08-15

## 2017-07-18 ENCOUNTER — Encounter: Admit: 2017-07-18 | Discharge: 2017-07-18 | Payer: Commercial Managed Care - HMO

## 2017-07-18 DIAGNOSIS — D701 Agranulocytosis secondary to cancer chemotherapy: Principal | ICD-10-CM

## 2017-07-21 ENCOUNTER — Encounter: Admit: 2017-07-21 | Discharge: 2017-07-21 | Payer: Commercial Managed Care - HMO

## 2017-07-21 DIAGNOSIS — C50919 Malignant neoplasm of unspecified site of unspecified female breast: Principal | ICD-10-CM

## 2017-07-21 MED ORDER — IBRANCE 100 MG PO CAP
ORAL_CAPSULE | Freq: Every day | 11 refills
Start: 2017-07-21 — End: ?

## 2017-07-21 MED ORDER — PALBOCICLIB 100 MG PO CAP
ORAL_CAPSULE | Freq: Every day | ORAL | 0 refills | 28.00000 days | Status: AC
Start: 2017-07-21 — End: 2017-08-19

## 2017-07-22 ENCOUNTER — Encounter: Admit: 2017-07-22 | Discharge: 2017-07-22 | Payer: Commercial Managed Care - HMO

## 2017-07-25 ENCOUNTER — Encounter: Admit: 2017-07-25 | Discharge: 2017-07-25 | Payer: Commercial Managed Care - HMO

## 2017-07-25 MED ORDER — SERTRALINE 50 MG PO TAB
ORAL_TABLET | Freq: Every day | 0 refills | Status: AC
Start: 2017-07-25 — End: 2017-08-26

## 2017-07-27 MED ORDER — ZOLEDRONIC AC-MANNITOL-0.9NACL 4 MG/100 ML IV PGBK
4 mg | Freq: Once | INTRAVENOUS | 0 refills | Status: CP
Start: 2017-07-27 — End: ?
  Administered 2017-07-29: 19:00:00 4 mg via INTRAVENOUS

## 2017-07-27 MED ORDER — FULVESTRANT 250 MG/5 ML IM SYRG
500 mg | Freq: Once | INTRAMUSCULAR | 0 refills | Status: CP
Start: 2017-07-27 — End: ?
  Administered 2017-07-29: 19:00:00 500 mg via INTRAMUSCULAR

## 2017-07-29 ENCOUNTER — Encounter: Admit: 2017-07-29 | Discharge: 2017-07-29 | Payer: Commercial Managed Care - HMO

## 2017-07-29 ENCOUNTER — Encounter: Admit: 2017-07-29 | Discharge: 2017-07-30 | Payer: Commercial Managed Care - HMO

## 2017-07-29 DIAGNOSIS — C7900 Secondary malignant neoplasm of unspecified kidney and renal pelvis: ICD-10-CM

## 2017-07-29 DIAGNOSIS — C787 Secondary malignant neoplasm of liver and intrahepatic bile duct: ICD-10-CM

## 2017-07-29 DIAGNOSIS — Z79818 Long term (current) use of other agents affecting estrogen receptors and estrogen levels: ICD-10-CM

## 2017-07-29 DIAGNOSIS — Z170 Estrogen receptor positive status [ER+]: ICD-10-CM

## 2017-07-29 DIAGNOSIS — G8929 Other chronic pain: ICD-10-CM

## 2017-07-29 DIAGNOSIS — K5900 Constipation, unspecified: ICD-10-CM

## 2017-07-29 DIAGNOSIS — K652 Spontaneous bacterial peritonitis: ICD-10-CM

## 2017-07-29 DIAGNOSIS — Z9011 Acquired absence of right breast and nipple: ICD-10-CM

## 2017-07-29 DIAGNOSIS — C782 Secondary malignant neoplasm of pleura: ICD-10-CM

## 2017-07-29 DIAGNOSIS — F3289 Other specified depressive episodes: ICD-10-CM

## 2017-07-29 DIAGNOSIS — C50919 Malignant neoplasm of unspecified site of unspecified female breast: Principal | ICD-10-CM

## 2017-07-29 DIAGNOSIS — C50911 Malignant neoplasm of unspecified site of right female breast: Principal | ICD-10-CM

## 2017-07-29 DIAGNOSIS — C7951 Secondary malignant neoplasm of bone: ICD-10-CM

## 2017-07-29 DIAGNOSIS — Z789 Other specified health status: ICD-10-CM

## 2017-07-29 DIAGNOSIS — R5383 Other fatigue: ICD-10-CM

## 2017-07-29 DIAGNOSIS — Z87311 Personal history of (healed) other pathological fracture: ICD-10-CM

## 2017-07-29 DIAGNOSIS — F329 Major depressive disorder, single episode, unspecified: ICD-10-CM

## 2017-07-29 LAB — CBC AND DIFF
Lab: 0 10*3/uL (ref 0–0.20)
Lab: 0 10*3/uL (ref 0–0.45)
Lab: 2.5 10*3/uL — ABNORMAL LOW (ref 4.5–11.0)

## 2017-07-30 LAB — COMPREHENSIVE METABOLIC PANEL
Lab: 0.2 mg/dL — ABNORMAL LOW (ref 0.3–1.2)
Lab: 0.7 mg/dL (ref 0.4–1.00)
Lab: 104 MMOL/L — ABNORMAL HIGH (ref 98–110)
Lab: 139 MMOL/L — ABNORMAL LOW (ref 137–147)
Lab: 17 U/L (ref 7–40)
Lab: 26 MMOL/L (ref 21–30)
Lab: 29 mg/dL — ABNORMAL HIGH (ref 7–25)
Lab: 3.8 MMOL/L (ref 3.5–5.1)
Lab: 4.2 g/dL (ref 3.5–5.0)
Lab: 46 U/L (ref 25–110)
Lab: 7.2 g/dL (ref >60)
Lab: 8 U/L (ref 7–56)
Lab: 9 10*3/uL — ABNORMAL LOW (ref 3–12)
Lab: 9.7 mg/dL (ref >60)
Lab: 92 mg/dL (ref 70–100)
Lab: >60 mL/min (ref >60)
Lab: >60 mL/min — ABNORMAL LOW (ref >60)

## 2017-07-30 LAB — MAGNESIUM: Lab: 1.9 mg/dL (ref 1.6–2.6)

## 2017-07-30 LAB — PHOSPHORUS: Lab: 4.3 mg/dL (ref 2.0–4.5)

## 2017-07-30 LAB — 25-OH VITAMIN D (D2 + D3): Lab: 19 ng/mL — ABNORMAL LOW (ref 30–80)

## 2017-08-03 ENCOUNTER — Encounter: Admit: 2017-08-03 | Discharge: 2017-08-03 | Payer: Commercial Managed Care - HMO

## 2017-08-03 DIAGNOSIS — C7951 Secondary malignant neoplasm of bone: ICD-10-CM

## 2017-08-03 DIAGNOSIS — E559 Vitamin D deficiency, unspecified: Principal | ICD-10-CM

## 2017-08-03 DIAGNOSIS — C50911 Malignant neoplasm of unspecified site of right female breast: ICD-10-CM

## 2017-08-03 DIAGNOSIS — C50919 Malignant neoplasm of unspecified site of unspecified female breast: Principal | ICD-10-CM

## 2017-08-03 MED ORDER — CALCIUM CARBONATE-VITAMIN D3 500 MG(1,250MG) -200 UNIT PO TAB
1 | Freq: Two times a day (BID) | ORAL | 0 refills | Status: CN
Start: 2017-08-03 — End: ?

## 2017-08-03 MED ORDER — ERGOCALCIFEROL (VITAMIN D2) 50,000 UNIT PO CAP
1 | ORAL_CAPSULE | ORAL | 0 refills | 56.00000 days | Status: AC
Start: 2017-08-03 — End: 2017-11-01

## 2017-08-03 MED ORDER — DENOSUMAB 120 MG/1.7 ML (70 MG/ML) SC SOLN
120 mg | Freq: Once | SUBCUTANEOUS | 0 refills | Status: CN
Start: 2017-08-03 — End: ?

## 2017-08-09 LAB — CBC AND DIFF

## 2017-08-15 ENCOUNTER — Encounter: Admit: 2017-08-15 | Discharge: 2017-08-15 | Payer: Commercial Managed Care - HMO

## 2017-08-15 MED ORDER — MORPHINE 60 MG PO TBER
60 mg | ORAL_TABLET | Freq: Two times a day (BID) | ORAL | 0 refills | 7.00000 days | Status: AC
Start: 2017-08-15 — End: 2017-09-13

## 2017-08-17 ENCOUNTER — Encounter: Admit: 2017-08-17 | Discharge: 2017-08-17 | Payer: Commercial Managed Care - HMO

## 2017-08-17 DIAGNOSIS — C50919 Malignant neoplasm of unspecified site of unspecified female breast: Principal | ICD-10-CM

## 2017-08-18 MED ORDER — IBRANCE 100 MG PO CAP
ORAL_CAPSULE | Freq: Every day | ORAL | 11 refills | 28.00000 days | Status: DC
Start: 2017-08-18 — End: 2017-08-19

## 2017-08-19 ENCOUNTER — Encounter: Admit: 2017-08-19 | Discharge: 2017-08-19 | Payer: Commercial Managed Care - HMO

## 2017-08-19 DIAGNOSIS — C50919 Malignant neoplasm of unspecified site of unspecified female breast: Principal | ICD-10-CM

## 2017-08-19 MED ORDER — PALBOCICLIB 100 MG PO CAP
ORAL_CAPSULE | Freq: Every day | 0 refills | Status: CN
Start: 2017-08-19 — End: ?

## 2017-08-19 MED ORDER — PALBOCICLIB 100 MG PO CAP
ORAL_CAPSULE | Freq: Every day | ORAL | 0 refills | 28.00000 days | Status: AC
Start: 2017-08-19 — End: 2017-09-28

## 2017-08-22 ENCOUNTER — Encounter: Admit: 2017-08-22 | Discharge: 2017-08-22 | Payer: Commercial Managed Care - HMO

## 2017-08-22 DIAGNOSIS — C50911 Malignant neoplasm of unspecified site of right female breast: Principal | ICD-10-CM

## 2017-08-25 ENCOUNTER — Encounter: Admit: 2017-08-25 | Discharge: 2017-08-25 | Payer: Commercial Managed Care - HMO

## 2017-08-25 MED ORDER — FULVESTRANT 250 MG/5 ML IM SYRG
500 mg | Freq: Once | INTRAMUSCULAR | 0 refills | Status: CP
Start: 2017-08-25 — End: ?
  Administered 2017-08-26: 16:00:00 500 mg via INTRAMUSCULAR

## 2017-08-26 ENCOUNTER — Encounter: Admit: 2017-08-26 | Discharge: 2017-08-26 | Payer: Commercial Managed Care - HMO

## 2017-08-26 ENCOUNTER — Encounter: Admit: 2017-08-26 | Discharge: 2017-08-27 | Payer: Commercial Managed Care - HMO

## 2017-08-26 DIAGNOSIS — C9011 Plasma cell leukemia in remission: ICD-10-CM

## 2017-08-26 DIAGNOSIS — C50911 Malignant neoplasm of unspecified site of right female breast: Principal | ICD-10-CM

## 2017-08-26 DIAGNOSIS — Z170 Estrogen receptor positive status [ER+]: ICD-10-CM

## 2017-08-26 DIAGNOSIS — C787 Secondary malignant neoplasm of liver and intrahepatic bile duct: ICD-10-CM

## 2017-08-26 DIAGNOSIS — G8929 Other chronic pain: ICD-10-CM

## 2017-08-26 DIAGNOSIS — Z79818 Long term (current) use of other agents affecting estrogen receptors and estrogen levels: ICD-10-CM

## 2017-08-26 DIAGNOSIS — C50919 Malignant neoplasm of unspecified site of unspecified female breast: Principal | ICD-10-CM

## 2017-08-26 DIAGNOSIS — C7951 Secondary malignant neoplasm of bone: ICD-10-CM

## 2017-08-26 DIAGNOSIS — C782 Secondary malignant neoplasm of pleura: ICD-10-CM

## 2017-08-26 DIAGNOSIS — Z87311 Personal history of (healed) other pathological fracture: ICD-10-CM

## 2017-08-26 DIAGNOSIS — C7900 Secondary malignant neoplasm of unspecified kidney and renal pelvis: ICD-10-CM

## 2017-08-26 DIAGNOSIS — Z789 Other specified health status: ICD-10-CM

## 2017-08-26 DIAGNOSIS — K5900 Constipation, unspecified: ICD-10-CM

## 2017-08-26 DIAGNOSIS — F329 Major depressive disorder, single episode, unspecified: ICD-10-CM

## 2017-08-26 DIAGNOSIS — K652 Spontaneous bacterial peritonitis: ICD-10-CM

## 2017-08-26 LAB — COMPREHENSIVE METABOLIC PANEL
Lab: 138 MMOL/L — ABNORMAL LOW (ref 137–147)
Lab: 3.9 MMOL/L (ref 3.5–5.1)

## 2017-08-26 LAB — CBC AND DIFF: Lab: 2.3 10*3/uL — ABNORMAL LOW (ref 4.5–11.0)

## 2017-08-26 MED ORDER — OXYCODONE 10 MG PO TAB
10 mg | ORAL_TABLET | ORAL | 0 refills | 6.00000 days | Status: AC | PRN
Start: 2017-08-26 — End: 2017-10-12

## 2017-08-26 MED ORDER — SERTRALINE 100 MG PO TAB
100 mg | ORAL_TABLET | Freq: Every day | ORAL | 0 refills | Status: SS
Start: 2017-08-26 — End: 2018-03-23

## 2017-08-29 MED ORDER — SERTRALINE 50 MG PO TAB
ORAL_TABLET | Freq: Every day | 0 refills
Start: 2017-08-29 — End: ?

## 2017-09-13 ENCOUNTER — Encounter: Admit: 2017-09-13 | Discharge: 2017-09-13 | Payer: Commercial Managed Care - HMO

## 2017-09-13 MED ORDER — MORPHINE 60 MG PO TBER
60 mg | ORAL_TABLET | Freq: Two times a day (BID) | ORAL | 0 refills | 7.00000 days | Status: AC
Start: 2017-09-13 — End: 2017-10-04

## 2017-09-16 ENCOUNTER — Encounter: Admit: 2017-09-16 | Discharge: 2017-09-16 | Payer: Commercial Managed Care - HMO

## 2017-09-16 DIAGNOSIS — C50919 Malignant neoplasm of unspecified site of unspecified female breast: Principal | ICD-10-CM

## 2017-09-22 MED ORDER — FULVESTRANT 250 MG/5 ML IM SYRG
500 mg | Freq: Once | INTRAMUSCULAR | 0 refills | Status: CP
Start: 2017-09-22 — End: ?
  Administered 2017-09-23: 15:00:00 500 mg via INTRAMUSCULAR

## 2017-09-23 ENCOUNTER — Encounter: Admit: 2017-09-23 | Discharge: 2017-09-23 | Payer: Commercial Managed Care - HMO

## 2017-09-23 ENCOUNTER — Encounter: Admit: 2017-09-23 | Discharge: 2017-09-24 | Payer: Commercial Managed Care - HMO

## 2017-09-23 DIAGNOSIS — C787 Secondary malignant neoplasm of liver and intrahepatic bile duct: ICD-10-CM

## 2017-09-23 DIAGNOSIS — Z79818 Long term (current) use of other agents affecting estrogen receptors and estrogen levels: ICD-10-CM

## 2017-09-23 DIAGNOSIS — C50911 Malignant neoplasm of unspecified site of right female breast: Principal | ICD-10-CM

## 2017-09-23 DIAGNOSIS — Z9011 Acquired absence of right breast and nipple: ICD-10-CM

## 2017-09-23 DIAGNOSIS — C7951 Secondary malignant neoplasm of bone: ICD-10-CM

## 2017-09-23 DIAGNOSIS — F329 Major depressive disorder, single episode, unspecified: ICD-10-CM

## 2017-09-23 DIAGNOSIS — Z789 Other specified health status: ICD-10-CM

## 2017-09-23 DIAGNOSIS — Z170 Estrogen receptor positive status [ER+]: ICD-10-CM

## 2017-09-23 DIAGNOSIS — C7900 Secondary malignant neoplasm of unspecified kidney and renal pelvis: ICD-10-CM

## 2017-09-23 DIAGNOSIS — D701 Agranulocytosis secondary to cancer chemotherapy: ICD-10-CM

## 2017-09-23 DIAGNOSIS — C50919 Malignant neoplasm of unspecified site of unspecified female breast: ICD-10-CM

## 2017-09-23 DIAGNOSIS — C782 Secondary malignant neoplasm of pleura: ICD-10-CM

## 2017-09-23 DIAGNOSIS — Z87311 Personal history of (healed) other pathological fracture: ICD-10-CM

## 2017-09-23 DIAGNOSIS — K652 Spontaneous bacterial peritonitis: ICD-10-CM

## 2017-09-23 DIAGNOSIS — K5900 Constipation, unspecified: ICD-10-CM

## 2017-09-23 DIAGNOSIS — G8929 Other chronic pain: ICD-10-CM

## 2017-09-28 ENCOUNTER — Encounter: Admit: 2017-09-28 | Discharge: 2017-09-28 | Payer: Commercial Managed Care - HMO

## 2017-09-28 DIAGNOSIS — C50919 Malignant neoplasm of unspecified site of unspecified female breast: Principal | ICD-10-CM

## 2017-09-28 DIAGNOSIS — C50911 Malignant neoplasm of unspecified site of right female breast: Principal | ICD-10-CM

## 2017-09-28 MED ORDER — PALBOCICLIB 100 MG PO CAP
ORAL_CAPSULE | Freq: Every day | ORAL | 0 refills | 28.00000 days | Status: AC
Start: 2017-09-28 — End: 2017-10-04

## 2017-09-29 ENCOUNTER — Encounter: Admit: 2017-09-29 | Discharge: 2017-09-29 | Payer: Commercial Managed Care - HMO

## 2017-09-29 MED ORDER — IBRANCE 100 MG PO CAP
ORAL_CAPSULE | Freq: Every day | 11 refills
Start: 2017-09-29 — End: ?

## 2017-09-30 ENCOUNTER — Encounter: Admit: 2017-09-30 | Discharge: 2017-10-13 | Payer: Commercial Managed Care - HMO

## 2017-09-30 DIAGNOSIS — C50911 Malignant neoplasm of unspecified site of right female breast: Principal | ICD-10-CM

## 2017-09-30 MED ORDER — IOHEXOL 240 MG IODINE/ML IV SOLN
150 mL | Freq: Once | INTRAVENOUS | 0 refills | Status: CP
Start: 2017-09-30 — End: ?
  Administered 2017-09-30: 18:00:00 150 mL via INTRAVENOUS

## 2017-10-03 ENCOUNTER — Encounter: Admit: 2017-10-03 | Discharge: 2017-10-03 | Payer: Commercial Managed Care - HMO

## 2017-10-03 DIAGNOSIS — C50911 Malignant neoplasm of unspecified site of right female breast: Principal | ICD-10-CM

## 2017-10-04 ENCOUNTER — Encounter: Admit: 2017-10-04 | Discharge: 2017-10-04 | Payer: Commercial Managed Care - HMO

## 2017-10-04 DIAGNOSIS — Z9011 Acquired absence of right breast and nipple: ICD-10-CM

## 2017-10-04 DIAGNOSIS — Z79818 Long term (current) use of other agents affecting estrogen receptors and estrogen levels: ICD-10-CM

## 2017-10-04 DIAGNOSIS — K652 Spontaneous bacterial peritonitis: ICD-10-CM

## 2017-10-04 DIAGNOSIS — F329 Major depressive disorder, single episode, unspecified: ICD-10-CM

## 2017-10-04 DIAGNOSIS — C50919 Malignant neoplasm of unspecified site of unspecified female breast: Secondary | ICD-10-CM

## 2017-10-04 DIAGNOSIS — C7900 Secondary malignant neoplasm of unspecified kidney and renal pelvis: ICD-10-CM

## 2017-10-04 DIAGNOSIS — G8929 Other chronic pain: ICD-10-CM

## 2017-10-04 DIAGNOSIS — Z87891 Personal history of nicotine dependence: ICD-10-CM

## 2017-10-04 DIAGNOSIS — C50911 Malignant neoplasm of unspecified site of right female breast: Principal | ICD-10-CM

## 2017-10-04 DIAGNOSIS — K5900 Constipation, unspecified: ICD-10-CM

## 2017-10-04 DIAGNOSIS — Z87311 Personal history of (healed) other pathological fracture: ICD-10-CM

## 2017-10-04 DIAGNOSIS — C7951 Secondary malignant neoplasm of bone: ICD-10-CM

## 2017-10-04 DIAGNOSIS — C787 Secondary malignant neoplasm of liver and intrahepatic bile duct: ICD-10-CM

## 2017-10-04 DIAGNOSIS — Z170 Estrogen receptor positive status [ER+]: ICD-10-CM

## 2017-10-04 DIAGNOSIS — C782 Secondary malignant neoplasm of pleura: ICD-10-CM

## 2017-10-04 DIAGNOSIS — Z789 Other specified health status: ICD-10-CM

## 2017-10-04 LAB — COMPREHENSIVE METABOLIC PANEL
Lab: 0.6 mg/dL (ref 0.4–1.00)
Lab: 140 MMOL/L — ABNORMAL LOW (ref 137–147)
Lab: 4.1 MMOL/L (ref 3.5–5.1)
Lab: 6.9 g/dL (ref 6.0–8.0)

## 2017-10-04 LAB — CBC AND DIFF: Lab: 1.9 10*3/uL — ABNORMAL LOW (ref 4.5–11.0)

## 2017-10-04 MED ORDER — MORPHINE 60 MG PO TBER
60 mg | ORAL_TABLET | Freq: Two times a day (BID) | ORAL | 0 refills | 7.00000 days | Status: AC
Start: 2017-10-04 — End: 2017-12-21

## 2017-10-04 MED ORDER — EXEMESTANE 25 MG PO TAB
25 mg | ORAL_TABLET | Freq: Every day | ORAL | 3 refills | 90.00000 days | Status: AC
Start: 2017-10-04 — End: 2017-10-28

## 2017-10-04 MED ORDER — EVEROLIMUS (ANTINEOPLASTIC) 10 MG PO TAB
10 mg | ORAL_TABLET | Freq: Every day | ORAL | 3 refills | Status: AC
Start: 2017-10-04 — End: 2018-01-24

## 2017-10-05 ENCOUNTER — Encounter: Admit: 2017-10-05 | Discharge: 2017-10-05 | Payer: Commercial Managed Care - HMO

## 2017-10-05 DIAGNOSIS — C50919 Malignant neoplasm of unspecified site of unspecified female breast: Principal | ICD-10-CM

## 2017-10-05 MED ORDER — PANTOPRAZOLE 40 MG PO TBEC
ORAL_TABLET | Freq: Every day | ORAL | 1 refills | 90.00000 days | Status: AC | PRN
Start: 2017-10-05 — End: ?

## 2017-10-06 ENCOUNTER — Encounter: Admit: 2017-10-06 | Discharge: 2017-10-06 | Payer: Commercial Managed Care - HMO

## 2017-10-12 ENCOUNTER — Encounter: Admit: 2017-10-12 | Discharge: 2017-10-12 | Payer: Commercial Managed Care - HMO

## 2017-10-12 DIAGNOSIS — C787 Secondary malignant neoplasm of liver and intrahepatic bile duct: ICD-10-CM

## 2017-10-12 DIAGNOSIS — C50911 Malignant neoplasm of unspecified site of right female breast: ICD-10-CM

## 2017-10-12 DIAGNOSIS — C50919 Malignant neoplasm of unspecified site of unspecified female breast: Principal | ICD-10-CM

## 2017-10-12 DIAGNOSIS — C7951 Secondary malignant neoplasm of bone: ICD-10-CM

## 2017-10-12 DIAGNOSIS — Z923 Personal history of irradiation: ICD-10-CM

## 2017-10-12 DIAGNOSIS — Z170 Estrogen receptor positive status [ER+]: ICD-10-CM

## 2017-10-12 DIAGNOSIS — R5383 Other fatigue: ICD-10-CM

## 2017-10-12 LAB — CBC AND DIFF
Lab: 0 10*3/uL (ref 0–0.45)
Lab: 0.1 10*3/uL (ref 0–0.20)
Lab: 0.2 10*3/uL (ref 0–0.80)
Lab: 0.9 10*3/uL — ABNORMAL LOW (ref 1.0–4.8)
Lab: 1 10*3/uL — ABNORMAL LOW (ref 1.8–7.0)
Lab: 12 g/dL (ref 12.0–15.0)
Lab: 13 % (ref 11–15)
Lab: 2 % (ref 0–5)
Lab: 2.2 10*3/uL — ABNORMAL LOW (ref 4.5–11.0)
Lab: 298 10*3/uL (ref 150–400)
Lab: 3.4 M/UL — ABNORMAL LOW (ref 4.0–5.0)
Lab: 34 % — ABNORMAL LOW (ref 36–45)
Lab: 35 pg — ABNORMAL HIGH (ref 26–34)
Lab: 36 g/dL — ABNORMAL HIGH (ref 32.0–36.0)
Lab: 42 % (ref 24–44)
Lab: 45 % (ref 41–77)
Lab: 7.1 FL (ref 7–11)
Lab: 8 % (ref 4–12)
Lab: 99 FL (ref 80–100)

## 2017-10-12 MED ORDER — RXAMB DEXAMETHASONE 0.5 MG/ML ORAL RINSE
Freq: Four times a day (QID) | INTRAMUSCULAR | 1 refills | 14.00000 days | Status: AC
Start: 2017-10-12 — End: 2017-10-28
  Filled 2017-10-13: qty 500, 13d supply

## 2017-10-12 MED ORDER — DEXAMETHASONE(#) MOUTH RINSE
Freq: Four times a day (QID) | 1 refills | Status: CN
Start: 2017-10-12 — End: ?

## 2017-10-12 MED ORDER — ONDANSETRON HCL 8 MG PO TAB
8 mg | ORAL_TABLET | ORAL | 3 refills | 8.00000 days | Status: AC | PRN
Start: 2017-10-12 — End: 2018-03-23

## 2017-10-12 MED ORDER — OXYCODONE 10 MG PO TAB
10 mg | ORAL_TABLET | ORAL | 0 refills | 6.00000 days | Status: AC | PRN
Start: 2017-10-12 — End: 2017-11-25

## 2017-10-13 ENCOUNTER — Encounter: Admit: 2017-10-13 | Discharge: 2017-10-13 | Payer: Commercial Managed Care - HMO

## 2017-10-13 DIAGNOSIS — C7951 Secondary malignant neoplasm of bone: ICD-10-CM

## 2017-10-13 DIAGNOSIS — R1903 Right lower quadrant abdominal swelling, mass and lump: ICD-10-CM

## 2017-10-13 DIAGNOSIS — Z170 Estrogen receptor positive status [ER+]: ICD-10-CM

## 2017-10-14 LAB — CA-27.29: Lab: 48 U/mL — ABNORMAL HIGH

## 2017-10-17 ENCOUNTER — Encounter: Admit: 2017-10-17 | Discharge: 2017-10-17 | Payer: Commercial Managed Care - HMO

## 2017-10-22 ENCOUNTER — Encounter: Admit: 2017-10-22 | Discharge: 2017-10-22 | Payer: Commercial Managed Care - HMO

## 2017-10-24 ENCOUNTER — Encounter: Admit: 2017-10-24 | Discharge: 2017-10-24 | Payer: Commercial Managed Care - HMO

## 2017-10-24 DIAGNOSIS — E559 Vitamin D deficiency, unspecified: Principal | ICD-10-CM

## 2017-10-24 MED ORDER — ERGOCALCIFEROL (VITAMIN D2) 50,000 UNIT PO CAP
1 | ORAL_CAPSULE | ORAL | 0 refills
Start: 2017-10-24 — End: ?

## 2017-10-25 ENCOUNTER — Encounter: Admit: 2017-10-25 | Discharge: 2017-10-25 | Payer: Commercial Managed Care - HMO

## 2017-10-27 ENCOUNTER — Encounter: Admit: 2017-10-27 | Discharge: 2017-10-27 | Payer: Commercial Managed Care - HMO

## 2017-10-27 MED ORDER — DENOSUMAB 120 MG/1.7 ML (70 MG/ML) SC SOLN
120 mg | Freq: Once | SUBCUTANEOUS | 0 refills | Status: CP
Start: 2017-10-27 — End: ?
  Administered 2017-10-28: 15:00:00 120 mg via SUBCUTANEOUS

## 2017-10-28 ENCOUNTER — Encounter: Admit: 2017-10-28 | Discharge: 2017-10-28 | Payer: Commercial Managed Care - HMO

## 2017-10-28 ENCOUNTER — Encounter: Admit: 2017-10-28 | Discharge: 2017-10-29 | Payer: Commercial Managed Care - HMO

## 2017-10-28 DIAGNOSIS — C782 Secondary malignant neoplasm of pleura: ICD-10-CM

## 2017-10-28 DIAGNOSIS — Z79818 Long term (current) use of other agents affecting estrogen receptors and estrogen levels: ICD-10-CM

## 2017-10-28 DIAGNOSIS — C50911 Malignant neoplasm of unspecified site of right female breast: Principal | ICD-10-CM

## 2017-10-28 DIAGNOSIS — C50919 Malignant neoplasm of unspecified site of unspecified female breast: Principal | ICD-10-CM

## 2017-10-28 DIAGNOSIS — K652 Spontaneous bacterial peritonitis: ICD-10-CM

## 2017-10-28 DIAGNOSIS — C7900 Secondary malignant neoplasm of unspecified kidney and renal pelvis: ICD-10-CM

## 2017-10-28 DIAGNOSIS — K5900 Constipation, unspecified: ICD-10-CM

## 2017-10-28 DIAGNOSIS — C787 Secondary malignant neoplasm of liver and intrahepatic bile duct: ICD-10-CM

## 2017-10-28 DIAGNOSIS — C7951 Secondary malignant neoplasm of bone: ICD-10-CM

## 2017-10-28 DIAGNOSIS — Z170 Estrogen receptor positive status [ER+]: ICD-10-CM

## 2017-10-28 DIAGNOSIS — E559 Vitamin D deficiency, unspecified: ICD-10-CM

## 2017-10-28 DIAGNOSIS — F329 Major depressive disorder, single episode, unspecified: ICD-10-CM

## 2017-10-28 DIAGNOSIS — Z9011 Acquired absence of right breast and nipple: ICD-10-CM

## 2017-10-28 DIAGNOSIS — Z789 Other specified health status: ICD-10-CM

## 2017-10-28 DIAGNOSIS — G8929 Other chronic pain: ICD-10-CM

## 2017-10-28 DIAGNOSIS — Z87311 Personal history of (healed) other pathological fracture: ICD-10-CM

## 2017-10-28 LAB — COMPREHENSIVE METABOLIC PANEL
Lab: 0.3 mg/dL (ref 0.3–1.2)
Lab: 0.6 mg/dL (ref 0.4–1.00)
Lab: 10 U/L (ref 7–56)
Lab: 103 MMOL/L (ref 98–110)
Lab: 12 mg/dL (ref 7–25)
Lab: 138 MMOL/L (ref 137–147)
Lab: 19 U/L (ref 7–40)
Lab: 29 MMOL/L (ref 21–30)
Lab: 3.9 MMOL/L (ref 3.5–5.1)
Lab: 4.2 g/dL (ref 3.5–5.0)
Lab: 47 U/L (ref 25–110)
Lab: 7.1 g/dL (ref 6.0–8.0)
Lab: 9.2 mg/dL (ref 8.5–10.6)
Lab: 90 mg/dL (ref 70–100)

## 2017-10-28 LAB — CBC AND DIFF
Lab: 0 10*3/uL (ref 0–0.20)
Lab: 0 10*3/uL (ref 0–0.45)
Lab: 0.4 10*3/uL (ref 0–0.80)
Lab: 0.7 10*3/uL — ABNORMAL LOW (ref 1.0–4.8)
Lab: 1 % (ref 0–2)
Lab: 1 % (ref 0–5)
Lab: 12 g/dL (ref 12.0–15.0)
Lab: 13 % (ref 11–15)
Lab: 14 % — ABNORMAL LOW (ref 24–44)
Lab: 3.5 10*3/uL (ref 1.8–7.0)
Lab: 3.6 M/UL — ABNORMAL LOW (ref 4.0–5.0)
Lab: 300 10*3/uL (ref 150–400)
Lab: 34 g/dL (ref 32.0–36.0)
Lab: 34 pg — ABNORMAL HIGH (ref 26–34)
Lab: 36 % (ref 36–45)
Lab: 4.6 10*3/uL (ref 4.5–11.0)
Lab: 7.7 FL (ref 7–11)
Lab: 76 % (ref 41–77)
Lab: 8 % (ref 4–12)
Lab: 99 FL (ref 80–100)

## 2017-10-28 LAB — LIPID PROFILE
Lab: 281 mg/dL
Lab: 31 mg/dL — ABNORMAL LOW (ref >40)
Lab: 312 mg/dL — ABNORMAL HIGH (ref <200)
Lab: 58 mg/dL

## 2017-10-28 LAB — 25-OH VITAMIN D (D2 + D3): Lab: 21 ng/mL — ABNORMAL LOW (ref <100)

## 2017-10-28 MED ORDER — RXAMB DEXAMETHASONE 0.5 MG/ML ORAL RINSE
Freq: Four times a day (QID) | 1 refills | Status: SS
Start: 2017-10-28 — End: 2018-03-13

## 2017-10-28 MED ORDER — MORPHINE 60 MG PO TBER
60 mg | ORAL_TABLET | Freq: Two times a day (BID) | ORAL | 0 refills | 7.00000 days | Status: AC
Start: 2017-10-28 — End: 2017-11-25

## 2017-10-28 MED ORDER — RXAMB DEXAMETHASONE 0.5 MG/ML ORAL RINSE
Freq: Four times a day (QID) | INTRAMUSCULAR | 1 refills | 12.50000 days | Status: AC
Start: 2017-10-28 — End: 2018-02-08
  Filled 2017-10-28 (×2): qty 500, 13d supply, fill #1

## 2017-10-28 MED ORDER — EXEMESTANE 25 MG PO TAB
25 mg | ORAL_TABLET | Freq: Every day | ORAL | 3 refills | 90.00000 days | Status: AC
Start: 2017-10-28 — End: 2018-02-08

## 2017-11-01 ENCOUNTER — Encounter: Admit: 2017-11-01 | Discharge: 2017-11-01 | Payer: Commercial Managed Care - HMO

## 2017-11-01 DIAGNOSIS — E559 Vitamin D deficiency, unspecified: Principal | ICD-10-CM

## 2017-11-01 MED ORDER — ERGOCALCIFEROL (VITAMIN D2) 50,000 UNIT PO CAP
1 | ORAL_CAPSULE | ORAL | 0 refills | 56.00000 days | Status: AC
Start: 2017-11-01 — End: 2018-04-04

## 2017-11-15 ENCOUNTER — Encounter: Admit: 2017-11-15 | Discharge: 2017-11-15 | Payer: Commercial Managed Care - HMO

## 2017-11-25 ENCOUNTER — Encounter: Admit: 2017-11-25 | Discharge: 2017-11-25 | Payer: Commercial Managed Care - HMO

## 2017-11-25 DIAGNOSIS — C50919 Malignant neoplasm of unspecified site of unspecified female breast: Secondary | ICD-10-CM

## 2017-11-25 DIAGNOSIS — T40605A Adverse effect of unspecified narcotics, initial encounter: ICD-10-CM

## 2017-11-25 DIAGNOSIS — C50911 Malignant neoplasm of unspecified site of right female breast: Principal | ICD-10-CM

## 2017-11-25 DIAGNOSIS — C773 Secondary and unspecified malignant neoplasm of axilla and upper limb lymph nodes: ICD-10-CM

## 2017-11-25 DIAGNOSIS — K5903 Drug induced constipation: ICD-10-CM

## 2017-11-25 DIAGNOSIS — G893 Neoplasm related pain (acute) (chronic): ICD-10-CM

## 2017-11-25 DIAGNOSIS — G8929 Other chronic pain: ICD-10-CM

## 2017-11-25 DIAGNOSIS — C787 Secondary malignant neoplasm of liver and intrahepatic bile duct: ICD-10-CM

## 2017-11-25 DIAGNOSIS — C7951 Secondary malignant neoplasm of bone: ICD-10-CM

## 2017-11-25 DIAGNOSIS — C775 Secondary and unspecified malignant neoplasm of intrapelvic lymph nodes: ICD-10-CM

## 2017-11-25 DIAGNOSIS — R4589 Other symptoms and signs involving emotional state: ICD-10-CM

## 2017-11-25 DIAGNOSIS — J910 Malignant pleural effusion: ICD-10-CM

## 2017-11-25 DIAGNOSIS — Z170 Estrogen receptor positive status [ER+]: ICD-10-CM

## 2017-11-25 DIAGNOSIS — E559 Vitamin D deficiency, unspecified: ICD-10-CM

## 2017-11-25 DIAGNOSIS — K652 Spontaneous bacterial peritonitis: ICD-10-CM

## 2017-11-25 DIAGNOSIS — R180 Malignant ascites: ICD-10-CM

## 2017-11-25 DIAGNOSIS — D72819 Decreased white blood cell count, unspecified: ICD-10-CM

## 2017-11-25 DIAGNOSIS — M2550 Pain in unspecified joint: ICD-10-CM

## 2017-11-25 DIAGNOSIS — Z789 Other specified health status: ICD-10-CM

## 2017-11-25 DIAGNOSIS — K5900 Constipation, unspecified: ICD-10-CM

## 2017-11-25 DIAGNOSIS — Z9011 Acquired absence of right breast and nipple: ICD-10-CM

## 2017-11-25 DIAGNOSIS — M791 Myalgia, unspecified site: ICD-10-CM

## 2017-11-25 DIAGNOSIS — Z87311 Personal history of (healed) other pathological fracture: ICD-10-CM

## 2017-11-25 DIAGNOSIS — F329 Major depressive disorder, single episode, unspecified: ICD-10-CM

## 2017-11-25 DIAGNOSIS — Z79811 Long term (current) use of aromatase inhibitors: ICD-10-CM

## 2017-11-25 LAB — COMPREHENSIVE METABOLIC PANEL
Lab: 0.3 mg/dL (ref 0.3–1.2)
Lab: 0.6 mg/dL (ref 0.4–1.00)
Lab: 13 mg/dL (ref 7–25)
Lab: 144 MMOL/L (ref 137–147)
Lab: 29 U/L — ABNORMAL LOW (ref 7–56)
Lab: 3.9 MMOL/L — ABNORMAL LOW (ref 3.5–5.1)
Lab: 30 MMOL/L (ref 21–30)
Lab: 32 U/L (ref 7–40)
Lab: 4 g/dL (ref 3.5–5.0)
Lab: 66 U/L (ref 25–110)
Lab: 7 K/UL — ABNORMAL LOW (ref 3–12)
Lab: 7 g/dL (ref 6.0–8.0)
Lab: 74 mg/dL (ref 70–100)
Lab: 9.1 mg/dL (ref 8.5–10.6)
Lab: >60 mL/min (ref >60)
Lab: >60 mL/min (ref >60)

## 2017-11-25 LAB — CBC AND DIFF
Lab: 0 10*3/uL (ref 0–0.20)
Lab: 2 10*3/uL — ABNORMAL LOW (ref 4.5–11.0)
Lab: 3.6 M/UL — ABNORMAL LOW (ref 4.0–5.0)

## 2017-11-25 MED ORDER — MORPHINE 60 MG PO TBER
60 mg | ORAL_TABLET | Freq: Two times a day (BID) | ORAL | 0 refills | 7.00000 days | Status: AC
Start: 2017-11-25 — End: 2018-02-15

## 2017-11-25 MED ORDER — OXYCODONE 10 MG PO TAB
10 mg | ORAL_TABLET | ORAL | 0 refills | 6.00000 days | Status: AC | PRN
Start: 2017-11-25 — End: 2017-12-21

## 2017-11-25 MED ORDER — OXYCODONE 10 MG PO TAB
10 mg | ORAL_TABLET | ORAL | 0 refills | 6.00000 days | Status: AC | PRN
Start: 2017-11-25 — End: 2017-11-25

## 2017-11-28 LAB — CA-27.29: Lab: 68 U/mL — ABNORMAL HIGH (ref 98–110)

## 2017-12-21 ENCOUNTER — Encounter: Admit: 2017-12-21 | Discharge: 2017-12-21 | Payer: Commercial Managed Care - HMO

## 2017-12-21 DIAGNOSIS — E559 Vitamin D deficiency, unspecified: ICD-10-CM

## 2017-12-21 DIAGNOSIS — Z170 Estrogen receptor positive status [ER+]: ICD-10-CM

## 2017-12-21 DIAGNOSIS — G893 Neoplasm related pain (acute) (chronic): ICD-10-CM

## 2017-12-21 DIAGNOSIS — Z789 Other specified health status: ICD-10-CM

## 2017-12-21 DIAGNOSIS — F329 Major depressive disorder, single episode, unspecified: ICD-10-CM

## 2017-12-21 DIAGNOSIS — C50919 Malignant neoplasm of unspecified site of unspecified female breast: Secondary | ICD-10-CM

## 2017-12-21 DIAGNOSIS — Z87311 Personal history of (healed) other pathological fracture: ICD-10-CM

## 2017-12-21 DIAGNOSIS — K5900 Constipation, unspecified: ICD-10-CM

## 2017-12-21 DIAGNOSIS — C50911 Malignant neoplasm of unspecified site of right female breast: Principal | ICD-10-CM

## 2017-12-21 DIAGNOSIS — G8929 Other chronic pain: ICD-10-CM

## 2017-12-21 DIAGNOSIS — K652 Spontaneous bacterial peritonitis: ICD-10-CM

## 2017-12-21 LAB — COMPREHENSIVE METABOLIC PANEL
Lab: 0.3 mg/dL (ref 0.3–1.2)
Lab: 0.8 mg/dL (ref 0.4–1.00)
Lab: 102 mg/dL — ABNORMAL HIGH (ref 70–100)
Lab: 12 10*3/uL — ABNORMAL LOW (ref 3–12)
Lab: 13 mg/dL (ref 7–25)
Lab: 145 MMOL/L — ABNORMAL LOW (ref 137–147)
Lab: 19 U/L (ref 7–56)
Lab: 26 MMOL/L (ref 21–30)
Lab: 28 U/L (ref 7–40)
Lab: 3.8 MMOL/L — ABNORMAL LOW (ref 3.5–5.1)
Lab: 4.2 g/dL — ABNORMAL LOW (ref 3.5–5.0)
Lab: 6.9 g/dL (ref 6.0–8.0)
Lab: 61 U/L (ref 25–110)
Lab: 8.9 mg/dL (ref 8.5–10.6)
Lab: >60 mL/min (ref >60)
Lab: >60 mL/min (ref >60)

## 2017-12-21 LAB — CBC AND DIFF
Lab: 0 10*3/uL (ref 0–0.20)
Lab: 2.9 10*3/uL — ABNORMAL LOW (ref 4.5–11.0)
Lab: 3.5 M/UL — ABNORMAL LOW (ref 4.0–5.0)

## 2017-12-21 MED ORDER — OXYCODONE 10 MG PO TAB
10-20 mg | ORAL_TABLET | ORAL | 0 refills | 6.00000 days | Status: AC | PRN
Start: 2017-12-21 — End: 2018-03-18

## 2017-12-21 MED ORDER — MORPHINE 60 MG PO TBER
60 mg | ORAL_TABLET | Freq: Two times a day (BID) | ORAL | 0 refills | 7.00000 days | Status: AC
Start: 2017-12-21 — End: 2018-02-15

## 2017-12-22 LAB — 25-OH VITAMIN D (D2 + D3): Lab: 19 ng/mL — ABNORMAL LOW (ref 30–80)

## 2017-12-23 LAB — CA-27.29: Lab: 86 U/mL — ABNORMAL HIGH (ref 98–110)

## 2018-01-22 ENCOUNTER — Encounter: Admit: 2018-01-22 | Discharge: 2018-01-22 | Payer: Commercial Managed Care - HMO

## 2018-01-22 DIAGNOSIS — C50919 Malignant neoplasm of unspecified site of unspecified female breast: Principal | ICD-10-CM

## 2018-01-23 ENCOUNTER — Encounter: Admit: 2018-01-23 | Discharge: 2018-01-23 | Payer: Commercial Managed Care - HMO

## 2018-01-24 ENCOUNTER — Encounter: Admit: 2018-01-24 | Discharge: 2018-01-24 | Payer: Commercial Managed Care - HMO

## 2018-01-24 DIAGNOSIS — C50919 Malignant neoplasm of unspecified site of unspecified female breast: Principal | ICD-10-CM

## 2018-01-24 MED ORDER — AFINITOR 10 MG PO TAB
ORAL_TABLET | Freq: Every day | 3 refills
Start: 2018-01-24 — End: ?

## 2018-01-24 MED ORDER — EVEROLIMUS (ANTINEOPLASTIC) 10 MG PO TAB
10 mg | ORAL_TABLET | Freq: Every day | ORAL | 3 refills | Status: AC
Start: 2018-01-24 — End: 2018-03-23

## 2018-01-24 MED ORDER — EXEMESTANE 25 MG PO TAB
25 mg | ORAL_TABLET | Freq: Every day | ORAL | 3 refills | 90.00000 days | Status: AC
Start: 2018-01-24 — End: 2018-03-23

## 2018-02-02 MED ORDER — DENOSUMAB 120 MG/1.7 ML (70 MG/ML) SC SOLN
120 mg | Freq: Once | SUBCUTANEOUS | 0 refills | Status: CP
Start: 2018-02-02 — End: ?
  Administered 2018-02-03: 16:00:00 120 mg via SUBCUTANEOUS

## 2018-02-03 ENCOUNTER — Encounter: Admit: 2018-02-03 | Discharge: 2018-02-03 | Payer: Commercial Managed Care - HMO

## 2018-02-03 ENCOUNTER — Encounter: Admit: 2018-02-03 | Discharge: 2018-02-04 | Payer: Commercial Managed Care - HMO

## 2018-02-03 DIAGNOSIS — C7951 Secondary malignant neoplasm of bone: Secondary | ICD-10-CM

## 2018-02-03 DIAGNOSIS — R300 Dysuria: Secondary | ICD-10-CM

## 2018-02-03 DIAGNOSIS — C50911 Malignant neoplasm of unspecified site of right female breast: Secondary | ICD-10-CM

## 2018-02-03 DIAGNOSIS — C7900 Secondary malignant neoplasm of unspecified kidney and renal pelvis: Secondary | ICD-10-CM

## 2018-02-03 DIAGNOSIS — C782 Secondary malignant neoplasm of pleura: Secondary | ICD-10-CM

## 2018-02-03 DIAGNOSIS — Z79818 Long term (current) use of other agents affecting estrogen receptors and estrogen levels: Secondary | ICD-10-CM

## 2018-02-03 DIAGNOSIS — E559 Vitamin D deficiency, unspecified: Secondary | ICD-10-CM

## 2018-02-03 DIAGNOSIS — C50919 Malignant neoplasm of unspecified site of unspecified female breast: Secondary | ICD-10-CM

## 2018-02-03 DIAGNOSIS — Z170 Estrogen receptor positive status [ER+]: Secondary | ICD-10-CM

## 2018-02-03 DIAGNOSIS — C787 Secondary malignant neoplasm of liver and intrahepatic bile duct: Secondary | ICD-10-CM

## 2018-02-03 DIAGNOSIS — G8929 Other chronic pain: Secondary | ICD-10-CM

## 2018-02-03 DIAGNOSIS — F329 Major depressive disorder, single episode, unspecified: Secondary | ICD-10-CM

## 2018-02-03 DIAGNOSIS — Z9011 Acquired absence of right breast and nipple: Secondary | ICD-10-CM

## 2018-02-03 DIAGNOSIS — K5900 Constipation, unspecified: Secondary | ICD-10-CM

## 2018-02-03 DIAGNOSIS — Z87311 Personal history of (healed) other pathological fracture: Secondary | ICD-10-CM

## 2018-02-03 DIAGNOSIS — F3289 Other specified depressive episodes: ICD-10-CM

## 2018-02-03 DIAGNOSIS — Z789 Other specified health status: Secondary | ICD-10-CM

## 2018-02-03 DIAGNOSIS — K652 Spontaneous bacterial peritonitis: Secondary | ICD-10-CM

## 2018-02-03 DIAGNOSIS — G893 Neoplasm related pain (acute) (chronic): ICD-10-CM

## 2018-02-03 LAB — CBC AND DIFF
Lab: 0 10*3/uL (ref 0–0.20)
Lab: 0 10*3/uL (ref 0–0.45)
Lab: 0.2 10*3/uL (ref 0–0.80)
Lab: 0.5 10*3/uL — ABNORMAL LOW (ref 1.0–4.8)
Lab: 1 % (ref >60)
Lab: 1 % (ref >60)
Lab: 13 % (ref 11–15)
Lab: 17 % — ABNORMAL LOW (ref 24–44)
Lab: 2.3 10*3/uL (ref 1.8–7.0)
Lab: 252 K/UL (ref 150–400)
Lab: 29 pg (ref 26–34)
Lab: 3.1 10*3/uL — ABNORMAL LOW (ref 4.5–11.0)
Lab: 33 g/dL — ABNORMAL LOW (ref 32.0–36.0)
Lab: 37 % (ref 36–45)
Lab: 4.1 M/UL (ref 4.0–5.0)
Lab: 7.6 FL (ref 7–11)
Lab: 73 % (ref 41–77)
Lab: 8 % (ref 4–12)
Lab: 89 FL (ref 80–100)

## 2018-02-03 LAB — COMPREHENSIVE METABOLIC PANEL
Lab: 142 MMOL/L (ref 137–147)
Lab: 3.6 MMOL/L (ref 3.5–5.1)

## 2018-02-03 LAB — URINALYSIS DIPSTICK
Lab: 1 (ref 1.003–1.035)
Lab: 6 (ref 5.0–8.0)
Lab: NEGATIVE
Lab: NEGATIVE
Lab: NEGATIVE
Lab: NEGATIVE
Lab: NEGATIVE
Lab: NEGATIVE
Lab: NEGATIVE

## 2018-02-03 MED ORDER — TRIAMCINOLONE ACETONIDE 0.025 % TP CREA
Freq: Two times a day (BID) | TOPICAL | 0 refills | Status: AC
Start: 2018-02-03 — End: 2018-02-06

## 2018-02-04 LAB — 25-OH VITAMIN D (D2 + D3): Lab: 16 ng/mL — ABNORMAL LOW (ref 30–80)

## 2018-02-06 ENCOUNTER — Encounter: Admit: 2018-02-06 | Discharge: 2018-02-06 | Payer: Commercial Managed Care - HMO

## 2018-02-06 LAB — CA-27.29: Lab: 179 U/mL — ABNORMAL HIGH

## 2018-02-06 MED ORDER — TRIAMCINOLONE ACETONIDE 0.025 % TP CREA
Freq: Two times a day (BID) | TOPICAL | 0 refills | Status: SS
Start: 2018-02-06 — End: 2018-05-22

## 2018-02-08 ENCOUNTER — Encounter: Admit: 2018-02-08 | Discharge: 2018-02-08 | Payer: Commercial Managed Care - HMO

## 2018-02-15 ENCOUNTER — Encounter: Admit: 2018-02-15 | Discharge: 2018-02-15 | Payer: Commercial Managed Care - HMO

## 2018-02-15 MED ORDER — MORPHINE 60 MG PO TBER
60 mg | ORAL_TABLET | Freq: Two times a day (BID) | ORAL | 0 refills | Status: SS
Start: 2018-02-15 — End: 2018-03-16

## 2018-02-16 ENCOUNTER — Encounter: Admit: 2018-02-16 | Discharge: 2018-02-16 | Payer: Commercial Managed Care - HMO

## 2018-02-16 DIAGNOSIS — C7971 Secondary malignant neoplasm of right adrenal gland: Secondary | ICD-10-CM

## 2018-02-16 DIAGNOSIS — C7951 Secondary malignant neoplasm of bone: Secondary | ICD-10-CM

## 2018-02-16 DIAGNOSIS — R188 Other ascites: Secondary | ICD-10-CM

## 2018-02-16 DIAGNOSIS — Z170 Estrogen receptor positive status [ER+]: Secondary | ICD-10-CM

## 2018-02-16 DIAGNOSIS — C50911 Malignant neoplasm of unspecified site of right female breast: Secondary | ICD-10-CM

## 2018-02-16 MED ORDER — IOHEXOL 240 MG IODINE/ML IV SOLN
150 mL | Freq: Once | INTRAVENOUS | 0 refills | Status: CP
Start: 2018-02-16 — End: ?
  Administered 2018-02-16: 21:00:00 150 mL via INTRAVENOUS

## 2018-02-21 ENCOUNTER — Encounter: Admit: 2018-02-21 | Discharge: 2018-02-21 | Payer: Commercial Managed Care - HMO

## 2018-02-21 DIAGNOSIS — K5900 Constipation, unspecified: Secondary | ICD-10-CM

## 2018-02-21 DIAGNOSIS — K652 Spontaneous bacterial peritonitis: Secondary | ICD-10-CM

## 2018-02-21 DIAGNOSIS — C50919 Malignant neoplasm of unspecified site of unspecified female breast: Secondary | ICD-10-CM

## 2018-02-21 DIAGNOSIS — Z87311 Personal history of (healed) other pathological fracture: Secondary | ICD-10-CM

## 2018-02-21 DIAGNOSIS — Z170 Estrogen receptor positive status [ER+]: Secondary | ICD-10-CM

## 2018-02-21 DIAGNOSIS — E559 Vitamin D deficiency, unspecified: ICD-10-CM

## 2018-02-21 DIAGNOSIS — C50911 Malignant neoplasm of unspecified site of right female breast: Secondary | ICD-10-CM

## 2018-02-21 DIAGNOSIS — Z789 Other specified health status: Secondary | ICD-10-CM

## 2018-02-21 DIAGNOSIS — K5903 Drug induced constipation: ICD-10-CM

## 2018-02-21 DIAGNOSIS — G8929 Other chronic pain: Secondary | ICD-10-CM

## 2018-02-21 DIAGNOSIS — C7951 Secondary malignant neoplasm of bone: Secondary | ICD-10-CM

## 2018-02-21 DIAGNOSIS — F329 Major depressive disorder, single episode, unspecified: Secondary | ICD-10-CM

## 2018-02-21 MED ORDER — MORPHINE 15 MG PO TBER
15 mg | ORAL_TABLET | Freq: Two times a day (BID) | ORAL | 0 refills | Status: SS
Start: 2018-02-21 — End: 2018-03-16

## 2018-03-06 ENCOUNTER — Encounter: Admit: 2018-03-06 | Discharge: 2018-03-06 | Payer: Commercial Managed Care - HMO

## 2018-03-06 MED ORDER — PROCHLORPERAZINE MALEATE 10 MG PO TAB
10 mg | ORAL_TABLET | ORAL | 0 refills | Status: SS | PRN
Start: 2018-03-06 — End: 2018-03-23

## 2018-03-06 NOTE — Telephone Encounter
Add'l info to Donna Tyler's earlier MyChart message:  N/V improved today and has been able to keep some food down. N/V was not resolved with zofran past 2 days.  SOA comes and goes. Currently improved, present, but better. She does not feel it necessary to go to the ED for SOA at this time.   Her biggest complaint/concern is her pelvic pain. She has been taking MS Contin 75mg  BID with no relief. She also took her oxy IR x2 doses.    Leya would like something different for nausea and advice on how to achieve better pain control.

## 2018-03-09 ENCOUNTER — Encounter: Admit: 2018-03-09 | Discharge: 2018-03-09 | Payer: Commercial Managed Care - HMO

## 2018-03-09 DIAGNOSIS — G893 Neoplasm related pain (acute) (chronic): ICD-10-CM

## 2018-03-09 DIAGNOSIS — R1900 Intra-abdominal and pelvic swelling, mass and lump, unspecified site: ICD-10-CM

## 2018-03-09 DIAGNOSIS — C50911 Malignant neoplasm of unspecified site of right female breast: Principal | ICD-10-CM

## 2018-03-09 DIAGNOSIS — C7951 Secondary malignant neoplasm of bone: ICD-10-CM

## 2018-03-12 ENCOUNTER — Encounter: Admit: 2018-03-12 | Discharge: 2018-03-12 | Payer: Commercial Managed Care - HMO

## 2018-03-12 ENCOUNTER — Ambulatory Visit: Admit: 2018-03-12 | Discharge: 2018-03-25 | Payer: Commercial Managed Care - HMO

## 2018-03-12 ENCOUNTER — Inpatient Hospital Stay
Admit: 2018-03-12 | Discharge: 2018-03-18 | Disposition: A | Discharge status: Home / Self Care | Payer: Commercial Managed Care - HMO | Source: Other Acute Inpatient Hospital

## 2018-03-12 DIAGNOSIS — K652 Spontaneous bacterial peritonitis: ICD-10-CM

## 2018-03-12 DIAGNOSIS — K5900 Constipation, unspecified: ICD-10-CM

## 2018-03-12 DIAGNOSIS — C50919 Malignant neoplasm of unspecified site of unspecified female breast: Principal | ICD-10-CM

## 2018-03-12 DIAGNOSIS — Z789 Other specified health status: ICD-10-CM

## 2018-03-12 DIAGNOSIS — Z87311 Personal history of (healed) other pathological fracture: ICD-10-CM

## 2018-03-12 DIAGNOSIS — G8929 Other chronic pain: ICD-10-CM

## 2018-03-12 DIAGNOSIS — F329 Major depressive disorder, single episode, unspecified: ICD-10-CM

## 2018-03-12 DIAGNOSIS — J90 Pleural effusion, not elsewhere classified: Principal | ICD-10-CM

## 2018-03-12 MED ORDER — BUPROPION XL 150 MG PO TB24
300 mg | Freq: Every day | ORAL | 0 refills | Status: DC
Start: 2018-03-12 — End: 2018-03-13

## 2018-03-12 MED ORDER — HEPARIN, PORCINE (PF) 5,000 UNIT/0.5 ML IJ SYRG
5000 [IU] | SUBCUTANEOUS | 0 refills | Status: DC
Start: 2018-03-12 — End: 2018-03-13
  Administered 2018-03-13: 03:00:00 5000 [IU] via SUBCUTANEOUS

## 2018-03-12 MED ORDER — MORPHINE 15 MG PO TBER
15 mg | Freq: Two times a day (BID) | ORAL | 0 refills | Status: DC
Start: 2018-03-12 — End: 2018-03-13
  Administered 2018-03-13 (×2): 15 mg via ORAL

## 2018-03-12 MED ORDER — SODIUM CHLORIDE 0.9 % IV SOLP
INTRAVENOUS | 0 refills | Status: AC
Start: 2018-03-12 — End: ?
  Administered 2018-03-13 – 2018-03-14 (×5): 1000.000 mL via INTRAVENOUS

## 2018-03-12 MED ORDER — SENNOSIDES-DOCUSATE SODIUM 8.6-50 MG PO TAB
1 | Freq: Two times a day (BID) | ORAL | 0 refills | Status: DC
Start: 2018-03-12 — End: 2018-03-18
  Administered 2018-03-13: 03:00:00 1 via ORAL

## 2018-03-12 MED ORDER — MORPHINE 30 MG PO TBER
60 mg | Freq: Two times a day (BID) | ORAL | 0 refills | Status: DC
Start: 2018-03-12 — End: 2018-03-13
  Administered 2018-03-13 (×2): 60 mg via ORAL

## 2018-03-12 MED ORDER — OXYCODONE 5 MG PO TAB
10-20 mg | ORAL | 0 refills | Status: DC | PRN
Start: 2018-03-12 — End: 2018-03-13
  Administered 2018-03-13 (×3): 20 mg via ORAL

## 2018-03-12 MED ORDER — SERTRALINE 100 MG PO TAB
100 mg | Freq: Every day | ORAL | 0 refills | Status: DC
Start: 2018-03-12 — End: 2018-03-18
  Administered 2018-03-13 – 2018-03-17 (×5): 100 mg via ORAL

## 2018-03-12 MED ORDER — FENTANYL CITRATE (PF) 50 MCG/ML IJ SOLN
25-50 ug | INTRAVENOUS | 0 refills | Status: DC | PRN
Start: 2018-03-12 — End: 2018-03-13
  Administered 2018-03-13 (×2): 50 ug via INTRAVENOUS

## 2018-03-12 MED ORDER — PANTOPRAZOLE 40 MG PO TBEC
40 mg | Freq: Every day | ORAL | 0 refills | Status: DC
Start: 2018-03-12 — End: 2018-03-18
  Administered 2018-03-13 – 2018-03-16 (×4): 40 mg via ORAL

## 2018-03-12 MED ORDER — ERGOCALCIFEROL (VITAMIN D2) 1,250 MCG (50,000 UNIT) PO CAP
50000 [IU] | ORAL | 0 refills | Status: DC
Start: 2018-03-12 — End: 2018-03-13
  Administered 2018-03-13: 14:00:00 50000 [IU] via ORAL

## 2018-03-12 MED ORDER — POLYETHYLENE GLYCOL 3350 17 GRAM PO PWPK
2 | Freq: Two times a day (BID) | ORAL | 0 refills | Status: DC
Start: 2018-03-12 — End: 2018-03-18
  Administered 2018-03-13: 03:00:00 34 g via ORAL
  Administered 2018-03-14: 03:00:00 17 g via ORAL
  Administered 2018-03-15 – 2018-03-17 (×3): 34 g via ORAL

## 2018-03-12 MED ORDER — BISACODYL 10 MG RE SUPP
10 mg | Freq: Every day | RECTAL | 0 refills | Status: DC | PRN
Start: 2018-03-12 — End: 2018-03-18

## 2018-03-12 MED ORDER — HYDROMORPHONE (PF) 2 MG/ML IJ SYRG
.5-1 mg | INTRAVENOUS | 0 refills | Status: DC | PRN
Start: 2018-03-12 — End: 2018-03-13
  Administered 2018-03-13: 01:00:00 0.5 mg via INTRAVENOUS

## 2018-03-12 NOTE — Progress Notes
53yo female with stage IV metastatic breast ca with mets to the bones, lymph nodes, pelvis and adrenals. Presents to the ED with SOB and intractable pain. CT Chest, ABD and Pelvis is (-) for PE but shows bilat large pleural effusions, atelectasis,  ascites and progression of her disease. Labs are unremarkable,   VS: 142//87 / 105 / 97RA, / 18 / Afebrile. Requesting transfer continuity of care and pain control.

## 2018-03-13 LAB — PLEURAL FLUID LIPASE: Lab: 3 U/L

## 2018-03-13 LAB — COMPREHENSIVE METABOLIC PANEL
Lab: 0.3 mg/dL — ABNORMAL LOW (ref 0.3–1.2)
Lab: 0.8 mg/dL (ref 0.4–1.00)
Lab: 14 10*3/uL — ABNORMAL HIGH (ref 3–12)
Lab: 142 MMOL/L — ABNORMAL LOW (ref 137–147)
Lab: 15 mg/dL (ref 7–25)
Lab: 22 MMOL/L (ref 21–30)
Lab: 23 U/L — ABNORMAL LOW (ref 7–56)
Lab: 3.8 g/dL — ABNORMAL HIGH (ref 3.5–5.0)
Lab: 46 U/L — ABNORMAL HIGH (ref 7–40)
Lab: 6.8 g/dL (ref 6.0–8.0)
Lab: 9.5 mg/dL (ref 8.5–10.6)
Lab: 98 U/L (ref 25–110)
Lab: >60 mL/min (ref >60)
Lab: >60 mL/min (ref >60)
Lab: 0.3 mg/dL (ref 0.3–1.2)
Lab: 0.7 mg/dL (ref 0.4–1.00)
Lab: 106 MMOL/L (ref 98–110)
Lab: 12 mg/dL (ref 7–25)
Lab: 13 K/UL — ABNORMAL HIGH (ref 3–12)
Lab: 141 MMOL/L — ABNORMAL LOW (ref 137–147)
Lab: 22 MMOL/L (ref 21–30)
Lab: 24 U/L (ref 7–56)
Lab: 3.4 MMOL/L — ABNORMAL LOW (ref 3.5–5.1)
Lab: 3.8 g/dL — ABNORMAL LOW (ref 3.5–5.0)
Lab: 52 U/L — ABNORMAL HIGH (ref 7–40)
Lab: 6.9 g/dL (ref 6.0–8.0)
Lab: 76 mg/dL (ref 70–100)
Lab: 9.1 mg/dL (ref 8.5–10.6)
Lab: 99 U/L (ref 25–110)
Lab: >60 mL/min (ref >60)
Lab: >60 mL/min (ref >60)

## 2018-03-13 LAB — CBC AND DIFF
Lab: 4 10*3/uL — ABNORMAL LOW (ref 4.5–11.0)
Lab: 0 10*3/uL (ref 0–0.20)
Lab: 3.5 10*3/uL — ABNORMAL LOW (ref 4.5–11.0)
Lab: 3.8 M/UL — ABNORMAL LOW (ref 4.0–5.0)

## 2018-03-13 LAB — LIPASE: Lab: 24 U/L (ref 11–82)

## 2018-03-13 LAB — PLEURAL FLUID CHOLESTEROL: Lab: 87 mg/dL — ABNORMAL HIGH (ref <45)

## 2018-03-13 LAB — PTT (APTT): Lab: 29 s — ABNORMAL LOW (ref 24.0–36.5)

## 2018-03-13 LAB — PHOSPHORUS: Lab: 3.5 mg/dL (ref 2.0–4.5)

## 2018-03-13 LAB — PLEURAL FLUID TOTAL PROTEIN: Lab: 3.6 g/dL — ABNORMAL HIGH (ref <1.1)

## 2018-03-13 LAB — PLEURAL FLUID TRIGLYCERIDES: Lab: 57 mg/dL

## 2018-03-13 LAB — PLEURAL FLUID GLUCOSE: Lab: 73 mg/dL (ref 70–100)

## 2018-03-13 LAB — MAGNESIUM: Lab: 1.6 mg/dL (ref 1.6–2.6)

## 2018-03-13 LAB — PROTIME INR (PT): Lab: 1.2 M/UL — ABNORMAL LOW (ref 0.8–1.2)

## 2018-03-13 LAB — PLEURAL FLUID LACTATE DEHYDROGENASE: Lab: 154 U/L — ABNORMAL HIGH (ref 67–140)

## 2018-03-13 LAB — PLEURAL FLUID ALBUMIN: Lab: 2.4 g/dL

## 2018-03-13 LAB — TSH WITH FREE T4 REFLEX: Lab: 3.5 uU/mL (ref 0.35–5.00)

## 2018-03-13 MED ORDER — ONDANSETRON HCL (PF) 4 MG/2 ML IJ SOLN
4 mg | INTRAVENOUS | 0 refills | Status: DC | PRN
Start: 2018-03-13 — End: 2018-03-18
  Administered 2018-03-13 – 2018-03-14 (×2): 4 mg via INTRAVENOUS

## 2018-03-13 MED ORDER — DIPHENHYDRAMINE HCL 50 MG PO CAP
50 mg | Freq: Once | ORAL | 0 refills | Status: CP
Start: 2018-03-13 — End: ?
  Administered 2018-03-14: 04:00:00 50 mg via ORAL

## 2018-03-13 MED ORDER — MORPHINE PCA 50 MG/50 ML SYR (STD CONC)(ADULT)
INTRAVENOUS | 0 refills | Status: DC
Start: 2018-03-13 — End: 2018-03-15
  Administered 2018-03-13 – 2018-03-15 (×5): 50.000 mL via INTRAVENOUS

## 2018-03-13 MED ORDER — NALOXONE 0.4 MG/ML IJ SOLN
.08 mg | INTRAVENOUS | 0 refills | Status: DC | PRN
Start: 2018-03-13 — End: 2018-03-18

## 2018-03-13 MED ORDER — DICYCLOMINE 10 MG PO CAP
10 mg | Freq: Before meals | ORAL | 0 refills | Status: DC
Start: 2018-03-13 — End: 2018-03-15
  Administered 2018-03-13 – 2018-03-15 (×5): 10 mg via ORAL

## 2018-03-13 NOTE — Progress Notes
Brief pulmonary update note.   After discussion with Dr. Rozanna Box, we will proceed with EBUS tomorrow for biopsies of mediastinal LAD so that additional tissue can be obtained for possible immunotherapies in the future. Please make her NPO at midnight and hold DVT ppx tonight.

## 2018-03-13 NOTE — Other
Procedure Note    Donna Tyler is a 53 y.o. female.      Thoracentesis  Date/Time: 03/13/2018 4:07 PM  Performed by: Alfonzo Feller  Authorized by: Lorna Few, MD   Consent: Verbal consent obtained. Written consent obtained.  Risks and benefits: risks, benefits and alternatives were discussed  Consent given by: patient  Patient understanding: patient states understanding of the procedure being performed  Patient consent: the patient's understanding of the procedure matches consent given  Procedure consent: procedure consent matches procedure scheduled  Relevant documents: relevant documents present and verified  Test results: test results available and properly labeled  Site marked: the operative site was marked  Imaging studies: imaging studies available  Patient identity confirmed: verbally with patient, arm band and provided demographic data  Time out: Immediately prior to procedure a time out was called to verify the correct patient, procedure, equipment, support staff and site/side marked as required.  Procedure purpose: diagnostic  Indications: pleural effusion  Preparation: Patient was prepped and draped in the usual sterile fashion.  Local anesthesia used: yes  Anesthesia: local infiltration    Anesthesia:  Local anesthesia used: yes  Local Anesthetic: lidocaine 1% without epinephrine  Anesthetic total: 10 mL    Sedation:  Patient sedated: no    Preparation: skin prepped with Chloraprep  Patient position: sitting  Ultrasound guidance: yes  Location: right posterior  Puncture method: over-the-needle catheter  Number of attempts: 1  Drainage amount: 900 ml  Drainage characteristics: serous  Patient tolerance: Patient tolerated the procedure well with no immediate complications  Chest x-ray performed: yes              [name]

## 2018-03-13 NOTE — Progress Notes
-  To consider bronchoscopy when we have completed thoracentesis  -Oncology consulted    Depression   -Maintained on Zoloft 20 mg daily    FEN   -Mild hypokalemia will replace  -Regular diet n.p.o. midnight    Ppx - Heparin     Code - Full     Disp -we will maintain inpatient status being made  Due to multisystem nature of patient's underlying disease condition    _______________________________________________________________________________________________________________________________________________________    Subjective  Patient in moderate distress from pain regimen generalized although did get some relief on current dose of fentanyl.  Managed to maintain stable sats on room air denies any cough    Medications  Scheduled Meds:dicyclomine (BENTYL) capsule 10 mg, 10 mg, Oral, ACHS  heparin (porcine) PF syringe 5,000 Units, 5,000 Units, Subcutaneous, Q8H  pantoprazole DR (PROTONIX) tablet 40 mg, 40 mg, Oral, QDAY(21)  polyethylene glycol 3350 (MIRALAX) packet 34 g, 2 packet, Oral, BID  senna/docusate (SENOKOT-S) tablet 1 tablet, 1 tablet, Oral, BID  sertraline (ZOLOFT) tablet 100 mg, 100 mg, Oral, QDAY    Continuous Infusions:  ??? morphine PCA 50 mg/50 mL infusion syr (std conc)     ??? sodium chloride 0.9 %   infusion 100 mL/hr at 03/13/18 0654     PRN and Respiratory Meds:bisacodyL QDAY PRN, naloxone PRN, ondansetron (ZOFRAN) IV Q6H PRN      Objective                       Vital Signs: Last Filed                 Vital Signs: 24 Hour Range   BP: 136/75 (02/17 0804)  Temp: 36.3 ???C (97.3 ???F) (02/17 0804)  Pulse: 77 (02/17 0804)  Respirations: 24 PER MINUTE (02/17 0826)  SpO2: 93 % (02/17 0826)  Height: 162.6 cm (64) (02/16 1718) BP: (132-142)/(67-82)   Temp:  [36.2 ???C (97.1 ???F)-36.8 ???C (98.2 ???F)]   Pulse:  [77-88]   Respirations:  [18 PER MINUTE-24 PER MINUTE]   SpO2:  [92 %-99 %]    Intensity Pain Scale (Self Report): 7 (03/13/18 1914) Vitals:    03/12/18 1718   Weight: 67.5 kg (148 lb 12.8 oz)

## 2018-03-13 NOTE — Case Management (ED)
?   [facility]  LTACH: No  ? Acute Hospital Stay  Acute Hospital Stay: In the past  Was patient's stay within the last 30 days?: No    Roe Rutherford, BSN, RN  Case Manager  [phone]

## 2018-03-14 ENCOUNTER — Encounter: Admit: 2018-03-14 | Discharge: 2018-03-14 | Payer: Commercial Managed Care - HMO

## 2018-03-14 ENCOUNTER — Inpatient Hospital Stay: Admit: 2018-03-14 | Discharge: 2018-03-14 | Payer: Commercial Managed Care - HMO

## 2018-03-14 LAB — POC GLUCOSE
Lab: 61 mg/dL — ABNORMAL LOW (ref 70–100)
Lab: 100 mg/dL (ref 70–100)

## 2018-03-14 LAB — GRAM STAIN

## 2018-03-14 LAB — COMPREHENSIVE METABOLIC PANEL
Lab: 107 MMOL/L — ABNORMAL LOW (ref 98–110)
Lab: 142 MMOL/L — ABNORMAL LOW (ref 137–147)

## 2018-03-14 LAB — PLEURAL FLUID PH: Lab: 7.5 — ABNORMAL LOW (ref 7.60–7.66)

## 2018-03-14 LAB — CELL COUNT W/DIFF-FLUIDS: Lab: 370 /uL

## 2018-03-14 LAB — CBC AND DIFF: Lab: 8.2 K/UL — ABNORMAL LOW (ref >60)

## 2018-03-14 MED ORDER — MELATONIN 3 MG PO TAB
3 mg | Freq: Every evening | ORAL | 0 refills | Status: DC
Start: 2018-03-14 — End: 2018-03-18
  Administered 2018-03-17: 03:00:00 3 mg via ORAL

## 2018-03-14 MED ORDER — MEPERIDINE (PF) 25 MG/ML IJ SYRG
12.5 mg | INTRAVENOUS | 0 refills | Status: DC | PRN
Start: 2018-03-14 — End: 2018-03-15

## 2018-03-14 MED ORDER — HALOPERIDOL LACTATE 5 MG/ML IJ SOLN
1 mg | Freq: Once | INTRAVENOUS | 0 refills | Status: CP | PRN
Start: 2018-03-14 — End: ?

## 2018-03-14 MED ORDER — ONDANSETRON HCL (PF) 4 MG/2 ML IJ SOLN
INTRAVENOUS | 0 refills | Status: DC
Start: 2018-03-14 — End: 2018-03-14
  Administered 2018-03-14: 23:00:00 4 mg via INTRAVENOUS

## 2018-03-14 MED ORDER — DEXAMETHASONE SODIUM PHOSPHATE 4 MG/ML IJ SOLN
INTRAVENOUS | 0 refills | Status: DC
Start: 2018-03-14 — End: 2018-03-14
  Administered 2018-03-14: 23:00:00 4 mg via INTRAVENOUS

## 2018-03-14 MED ORDER — LIDOCAINE (PF) 200 MG/10 ML (2 %) IJ SYRG
0 refills | Status: DC
Start: 2018-03-14 — End: 2018-03-14
  Administered 2018-03-14: 22:00:00 40 mg via INTRAVENOUS

## 2018-03-14 MED ORDER — PROPOFOL INJ 10 MG/ML IV VIAL
0 refills | Status: DC
Start: 2018-03-14 — End: 2018-03-14
  Administered 2018-03-14: 23:00:00 20 mg via INTRAVENOUS
  Administered 2018-03-14: 23:00:00 40 mg via INTRAVENOUS
  Administered 2018-03-14: 22:00:00 80 mg via INTRAVENOUS
  Administered 2018-03-14: 23:00:00 40 mg via INTRAVENOUS
  Administered 2018-03-14: 23:00:00 20 mg via INTRAVENOUS

## 2018-03-14 MED ORDER — LACTATED RINGERS IV SOLP
1000 mL | INTRAVENOUS | 0 refills | Status: CN
Start: 2018-03-14 — End: ?

## 2018-03-14 MED ORDER — ONDANSETRON HCL (PF) 4 MG/2 ML IJ SOLN
4 mg | INTRAVENOUS | 0 refills | Status: DC
Start: 2018-03-14 — End: 2018-03-15
  Administered 2018-03-15 (×2): 4 mg via INTRAVENOUS

## 2018-03-14 MED ORDER — FENTANYL CITRATE (PF) 50 MCG/ML IJ SOLN
0 refills | Status: DC
Start: 2018-03-14 — End: 2018-03-14
  Administered 2018-03-14: 22:00:00 50 ug via INTRAVENOUS

## 2018-03-14 MED ORDER — DIPHENHYDRAMINE HCL 50 MG/ML IJ SOLN
12.5 mg | Freq: Once | INTRAVENOUS | 0 refills | Status: DC | PRN
Start: 2018-03-14 — End: 2018-03-15

## 2018-03-14 MED ORDER — SUCCINYLCHOLINE CHLORIDE 20 MG/ML IJ SOLN
INTRAVENOUS | 0 refills | Status: DC
Start: 2018-03-14 — End: 2018-03-14
  Administered 2018-03-14: 22:00:00 80 mg via INTRAVENOUS

## 2018-03-14 MED ORDER — OXYCODONE 5 MG PO TAB
5-10 mg | Freq: Once | ORAL | 0 refills | Status: DC | PRN
Start: 2018-03-14 — End: 2018-03-15

## 2018-03-14 MED ORDER — FENTANYL CITRATE (PF) 50 MCG/ML IJ SOLN
25 ug | INTRAVENOUS | 0 refills | Status: DC | PRN
Start: 2018-03-14 — End: 2018-03-15

## 2018-03-14 MED ORDER — LIDOCAINE (PF) 10 MG/ML (1 %) IJ SOLN
.1-2 mL | INTRAMUSCULAR | 0 refills | Status: CN | PRN
Start: 2018-03-14 — End: ?

## 2018-03-14 MED ORDER — SODIUM CHLORIDE 0.9 % IV SOLP
0 refills | Status: DC
Start: 2018-03-14 — End: 2018-03-14
  Administered 2018-03-14: 22:00:00 via INTRAVENOUS

## 2018-03-14 MED ORDER — PROPOFOL 10 MG/ML IV EMUL 20 ML (INFUSION)(AM)(OR)
0 refills | Status: DC
Start: 2018-03-14 — End: 2018-03-14
  Administered 2018-03-14: 23:00:00 120 ug/kg/min via INTRAVENOUS

## 2018-03-14 MED ADMIN — HALOPERIDOL LACTATE 5 MG/ML IJ SOLN [3584]: 1 mg | INTRAVENOUS | @ 23:00:00 | Stop: 2018-03-14 | NDC 63323047400

## 2018-03-14 MED ADMIN — DEXTROSE 50 % IN WATER (D50W) IV SOLP [86270]: 25 mL | INTRAVENOUS | @ 17:00:00 | Stop: 2018-03-14 | NDC 00409664802

## 2018-03-14 NOTE — Progress Notes
PRACTICAL & DISPOSITION PLANNING     Practical Issues:  Contacts:     Current PPS%:  40    Prognosis (Estimated):  Based on current function, current medical issues, goals of care, and prognosis models, my estimated prognosis is Unknown.    Goals: aggressive, see what other tx are possible    Disposition: Too soon to determine      SUBJECTIVE     CC/Reason for Visit: follow up symptoms    Subjective: pain is better than yesterday but she is still exquisitely tender, even to light touch, seems out of proportion to CT scan report from outside. Marginal sleep due to pain. Anticipating bronch/EBUS today. Moving bowels well. Shortness of breath better after thora yest, took out 900  ml          OBJECTIVE     Physical exam:  Blood pressure 125/76, pulse 90, temperature 36.8 ???C (98.2 ???F), height 162.6 cm (64), weight 67.5 kg (148 lb 12.8 oz), SpO2 95 %.  Gen: alert, sitting in bed, no distress  Eyes: perrl  Cv: rrr  Lungs: cta bilat no wheezes no accessory muscle usage  abd + voluntary guarding, ice pack on abdomen, very ttp generally, bs decreased  Ext warm, no edema pulses 2+  Skin: no rashes at present  Neuro: cn intact no lateralizing signs no myoclonus  Psych: calm no delirium at present    CBC    Lab Results   Component Value Date/Time    WBC 8.2 03/14/2018 06:53 AM    HGB 11.1 (L) 03/14/2018 06:53 AM    PLTCT 390 03/14/2018 06:53 AM    Lab Results   Component Value Date/Time    NEUT 82 (H) 03/14/2018 06:53 AM    ANC 6.80 03/14/2018 06:53 AM          Comprehensive Metabolic Profile    Lab Results   Component Value Date/Time    NA 142 03/14/2018 06:53 AM    K 3.9 03/14/2018 06:53 AM    BUN 8 03/14/2018 06:53 AM    CR 0.80 03/14/2018 06:53 AM    GLU 66 (L) 03/14/2018 06:53 AM    Lab Results   Component Value Date/Time    CA 8.7 03/14/2018 06:53 AM    PO4 3.5 03/12/2018 07:00 PM    ALBUMIN 3.9 03/14/2018 06:53 AM    TOTPROT 7.3 03/14/2018 06:53 AM    ALKPHOS 106 03/14/2018 06:53 AM    AST 60 (H) 03/14/2018 06:53 AM

## 2018-03-14 NOTE — Interval H&P Note
Fluid Source PLEURAL FLUID     Pathology Interpretation,Fluid      Pathologist Signature     PLEURAL FLUID ALBUMIN    Collection Time: 03/13/18  4:45 PM   Result Value Ref Range    Pleural Fluid Albumin 2.4 g/dL   PLEURAL FLUID CHOLESTEROL    Collection Time: 03/13/18  4:45 PM   Result Value Ref Range    Pleural Fluid Cholesterol 87 (H) <45 mg/dL   PLEURAL FLUID GLUCOSE    Collection Time: 03/13/18  4:45 PM   Result Value Ref Range    Pleural Fluid Glucose 73 70 - 100 mg/dL   PLEURAL FLUID LACTATE DEHYDROGENASE    Collection Time: 03/13/18  4:45 PM   Result Value Ref Range    Pleural Fluid Lactate Dehydrogenase 154 (H) 67 - 140 U/L   PLEURAL FLUID LIPASE    Collection Time: 03/13/18  4:45 PM   Result Value Ref Range    Pleural Fluid Lipase 3 U/L   PLEURAL FLUID PH    Collection Time: 03/13/18  4:45 PM   Result Value Ref Range    Pleural Fluid Ph 7.58 (L) 7.60 - 7.66   PLEURAL FLUID TOTAL PROTEIN    Collection Time: 03/13/18  4:45 PM   Result Value Ref Range    Pleural Fluid Total Protein 3.6 (H) <1.1 g/dL   PLEURAL FLUID TRIGLYCERIDES    Collection Time: 03/13/18  4:45 PM   Result Value Ref Range    Pleural Fluid Triglycerides 57 mg/dL   CULTURE-WOUND/TISSUE/FLUID(AEROBIC ONLY)W/SENSITIVITY    Collection Time: 03/13/18  4:45 PM   Result Value Ref Range    Battery Name ROUTINE CULTURE     Specimen Description PLEURAL FLUID  RIGHT       Special Requests NONE     Direct Gram Stain RARE  NEUTROPHILS  NO ORGANISMS SEEN       Culture      Report Status     GRAM STAIN    Collection Time: 03/13/18  4:45 PM   Result Value Ref Range    Battery Name GRAM STAIN      Specimen Description PLEURAL FLUID  RIGHT       Special Requests NONE     Gram Stain RARE  NEUTROPHILS  NO ORGANISMS SEEN       Report Status FINAL  03/13/2018      CBC AND DIFF    Collection Time: 03/14/18  6:53 AM   Result Value Ref Range    White Blood Cells 8.2 4.5 - 11.0 K/UL    RBC 4.01 4.0 - 5.0 M/UL    Hemoglobin 11.1 (L) 12.0 - 15.0 GM/DL

## 2018-03-14 NOTE — Progress Notes
Labs drawn from the left upper arm using ultrasound T.  VAT

## 2018-03-14 NOTE — Progress Notes
Intake/Output Summary:  (Last 24 hours)    Intake/Output Summary (Last 24 hours) at 03/14/2018 1041  Last data filed at 03/14/2018 3086  Gross per 24 hour   Intake 480 ml   Output 0 ml   Net 480 ml      Stool Occurrence: 1    Physical Exam  General appearance: fatigued, well-developed, cooperative and mild distress  Neurologic: Grossly normal  Lungs: Bilateral reduced breath sounds in both bases dull percussion noted right base to mid lobe on the right side  Heart: regular rate and rhythm, S1, S2 normal, no murmur, click, rub or gallop  Abdomen: soft, non-tender. Bowel sounds normal. No masses,  no organomegaly  Extremities: extremities normal, atraumatic, no cyanosis or edema     Lab Review  CBC w/Diff    Lab Results   Component Value Date/Time    WBC 8.2 03/14/2018 06:53 AM    RBC 4.01 03/14/2018 06:53 AM    HGB 11.1 (L) 03/14/2018 06:53 AM    HCT 34.2 (L) 03/14/2018 06:53 AM    MCV 85.2 03/14/2018 06:53 AM    MCH 27.6 03/14/2018 06:53 AM    MCHC 32.4 03/14/2018 06:53 AM    RDW 14.7 03/14/2018 06:53 AM    PLTCT 390 03/14/2018 06:53 AM    MPV 8.0 03/14/2018 06:53 AM    Lab Results   Component Value Date/Time    NEUT 82 (H) 03/14/2018 06:53 AM    ANC 6.80 03/14/2018 06:53 AM    LYMA 6 (L) 03/14/2018 06:53 AM    ALC 0.40 (L) 03/14/2018 06:53 AM    MONA 10 03/14/2018 06:53 AM    AMC 0.80 03/14/2018 06:53 AM    EOSA 1 03/14/2018 06:53 AM    AEC 0.10 03/14/2018 06:53 AM    BASA 1 03/14/2018 06:53 AM    ABC 0.10 03/14/2018 06:53 AM        Comprehensive Metabolic Profile    Lab Results   Component Value Date/Time    NA 142 03/14/2018 06:53 AM    K 3.9 03/14/2018 06:53 AM    CL 107 03/14/2018 06:53 AM    CO2 23 03/14/2018 06:53 AM    GAP 12 03/14/2018 06:53 AM    BUN 8 03/14/2018 06:53 AM    CR 0.80 03/14/2018 06:53 AM    GLU 66 (L) 03/14/2018 06:53 AM    Lab Results   Component Value Date/Time    CA 8.7 03/14/2018 06:53 AM    PO4 3.5 03/12/2018 07:00 PM    ALBUMIN 3.9 03/14/2018 06:53 AM    TOTPROT 7.3 03/14/2018 06:53 AM ALKPHOS 106 03/14/2018 06:53 AM    AST 60 (H) 03/14/2018 06:53 AM    ALT 28 03/14/2018 06:53 AM    TOTBILI 0.3 03/14/2018 06:53 AM    GFR >60 03/14/2018 06:53 AM    GFRAA >60 03/14/2018 06:53 AM          Point of Care Testing  (Last 24 hours)  Glucose: (!) 66  POC Glucose (Download): (!) 61    Radiology and other Diagnostics Review:      CXR - Impression     Bilateral pleural effusions.  Right paratracheal adenopathy.  ???  CT Chest PE protocol    Large bilateral pleural effusions.  No pulmonary emboli.     Extensive mediastinal adenopathy consistent with metastatic disease.     Metastatic bone disease.  Bibasilar consolidation consistent with atelectasis.  ???  CT Abdomen/Pelvis    Diffuse metastatic  involvement of the right lobe of the liver and caudate lobe.     Extensive retroperitoneal adenopathy.  Small amount of ascites.     Poorly defined enhancing multinodular area in the right hemipelvis suspicious for metastatic involvement right ovary and or the adjacent tissue around the right ovary.     Diffuse mesenteric edema and diffuse anasarca.      Donna Tyler, MBChB   Med Private P  Pg [phone]

## 2018-03-15 LAB — COMPREHENSIVE METABOLIC PANEL: Lab: 143 MMOL/L — ABNORMAL LOW (ref 137–147)

## 2018-03-15 LAB — CBC AND DIFF: Lab: 3.6 K/UL — ABNORMAL LOW (ref 4.5–11.0)

## 2018-03-15 MED ORDER — MORPHINE 15 MG PO TAB
30-45 mg | ORAL | 0 refills | Status: DC | PRN
Start: 2018-03-15 — End: 2018-03-16
  Administered 2018-03-16: 10:00:00 45 mg via ORAL
  Administered 2018-03-16 (×2): 30 mg via ORAL
  Administered 2018-03-16: 14:00:00 45 mg via ORAL

## 2018-03-15 MED ORDER — DICYCLOMINE 10 MG PO CAP
10 mg | Freq: Three times a day (TID) | ORAL | 0 refills | Status: DC | PRN
Start: 2018-03-15 — End: 2018-03-18

## 2018-03-15 MED ORDER — PHENOL 1.4 % MM SPRA
2 | OROMUCOSAL | 0 refills | Status: DC | PRN
Start: 2018-03-15 — End: 2018-03-18

## 2018-03-15 MED ORDER — IMS MIXTURE TEMPLATE
75 mg | ORAL | 0 refills | Status: DC
Start: 2018-03-15 — End: 2018-03-18
  Administered 2018-03-15 – 2018-03-17 (×14): 75 mg via ORAL

## 2018-03-15 MED ORDER — PROCHLORPERAZINE MALEATE 10 MG PO TAB
10 mg | ORAL | 0 refills | Status: DC | PRN
Start: 2018-03-15 — End: 2018-03-18
  Administered 2018-03-16: 16:00:00 10 mg via ORAL

## 2018-03-15 NOTE — Progress Notes
Labs drawn from the left upper arm using ultrasound T. Emily Forse VAT.

## 2018-03-15 NOTE — Progress Notes
Practical Issues:  Contacts:     Current PPS%:  40    Prognosis (Estimated):  Based on current function, current medical issues, goals of care, and prognosis models, my estimated prognosis is Unknown.    Goals: aggressive, see what other tx are possible    Disposition: Too soon to determine hopefully home tomorrow if pain okay      SUBJECTIVE     CC/Reason for Visit: follow up symptoms    Subjective: pain is better. Sleep better. Moving bowels well. Shortness of breath better after thora yest, took out 900  Ml, still nausaeated, feels like zofran makes it worse/makes her have reflux.           OBJECTIVE     Physical exam:  Blood pressure 124/65, pulse 86, temperature 37 ???C (98.6 ???F), height 162.6 cm (64), weight 67.5 kg (148 lb 12.8 oz), SpO2 93 %.  Gen: alert, sitting in bed, no distress moving about easily today  Eyes: perrl  Cv: rrr  Lungs: cta bilat no wheezes no accessory muscle usage  abd + voluntary guarding, ice pack on abdomen, very ttp generally, bs decreased  Ext warm, no edema pulses 2+  Skin: no rashes at present  Neuro: cn intact no lateralizing signs no myoclonus  Psych: calm no delirium at present    CBC    Lab Results   Component Value Date/Time    WBC 3.6 (L) 03/15/2018 07:19 AM    HGB 9.2 (L) 03/15/2018 07:19 AM    PLTCT 335 03/15/2018 07:19 AM    Lab Results   Component Value Date/Time    NEUT 75 03/15/2018 07:19 AM    ANC 2.70 03/15/2018 07:19 AM          Comprehensive Metabolic Profile    Lab Results   Component Value Date/Time    NA 143 03/15/2018 07:19 AM    K 3.2 (L) 03/15/2018 07:19 AM    BUN 8 03/15/2018 07:19 AM    CR 0.69 03/15/2018 07:19 AM    GLU 92 03/15/2018 07:19 AM    Lab Results   Component Value Date/Time    CA 7.9 (L) 03/15/2018 07:19 AM    PO4 3.5 03/12/2018 07:00 PM    ALBUMIN 3.3 (L) 03/15/2018 07:19 AM    TOTPROT 6.0 03/15/2018 07:19 AM    ALKPHOS 87 03/15/2018 07:19 AM    AST 39 03/15/2018 07:19 AM    ALT 19 03/15/2018 07:19 AM    TOTBILI 0.2 (L) 03/15/2018 07:19 AM

## 2018-03-15 NOTE — Progress Notes
-  Planned for repeat biopsy by bronchoscopy to check for PDL 1 possibly microsatellite instability and NTRK fusion gene testing  -If pulmonary  tissue not enough for testing will consider liver biopsy  -Successfully undergone bronchoscopy lymph nodes tissue was adequate we will hold off on liver biopsy    Depression   -Maintained on Zoloft 20 mg daily    FEN   -Electrolytes stable   -NPO currently will resume diet post procedure     Ppx - Heparin     Code - Full     Disp -we will maintain inpatient status this encounter took 35 minutes reviewing chart labs radiology counseling pt and answering questions on the plan of care with 20 minutes spent face to face with pt     _______________________________________________________________________________________________________________________________________________________    Subjective  No acute overnight issues no further nausea or emesis has been able to ambulate without any issues slight discomfort that puncture site from thoracentesis    Medications  Scheduled Meds:dicyclomine (BENTYL) capsule 10 mg, 10 mg, Oral, ACHS  melatonin tablet 3 mg, 3 mg, Oral, QHS  ondansetron (ZOFRAN) injection 4 mg, 4 mg, Intravenous, Q6H  pantoprazole DR (PROTONIX) tablet 40 mg, 40 mg, Oral, QDAY(21)  polyethylene glycol 3350 (MIRALAX) packet 34 g, 2 packet, Oral, BID  senna/docusate (SENOKOT-S) tablet 1 tablet, 1 tablet, Oral, BID  sertraline (ZOLOFT) tablet 100 mg, 100 mg, Oral, QDAY    Continuous Infusions:  ??? morphine PCA 50 mg/50 mL infusion syr (std conc)       PRN and Respiratory Meds:bisacodyL QDAY PRN, naloxone PRN, ondansetron (ZOFRAN) IV Q6H PRN      Objective                       Vital Signs: Last Filed                 Vital Signs: 24 Hour Range   BP: 127/74 (02/19 0900)  Temp: 36.7 ???C (98.1 ???F) (02/19 0900)  Pulse: 81 (02/19 0900)  Respirations: 16 PER MINUTE (02/19 0900)  SpO2: 95 % (02/19 0900)  SpO2 Pulse: 88 (02/18 1800) BP: (109-127)/(65-80)

## 2018-03-16 ENCOUNTER — Encounter: Admit: 2018-03-16 | Discharge: 2018-03-16 | Payer: Commercial Managed Care - HMO

## 2018-03-16 DIAGNOSIS — Z87311 Personal history of (healed) other pathological fracture: ICD-10-CM

## 2018-03-16 DIAGNOSIS — C50911 Malignant neoplasm of unspecified site of right female breast: Principal | ICD-10-CM

## 2018-03-16 DIAGNOSIS — Z789 Other specified health status: ICD-10-CM

## 2018-03-16 DIAGNOSIS — G8929 Other chronic pain: ICD-10-CM

## 2018-03-16 DIAGNOSIS — K652 Spontaneous bacterial peritonitis: ICD-10-CM

## 2018-03-16 DIAGNOSIS — C50919 Malignant neoplasm of unspecified site of unspecified female breast: Principal | ICD-10-CM

## 2018-03-16 DIAGNOSIS — F329 Major depressive disorder, single episode, unspecified: ICD-10-CM

## 2018-03-16 DIAGNOSIS — K5900 Constipation, unspecified: ICD-10-CM

## 2018-03-16 MED ORDER — MORPHINE 30 MG PO TAB
30-45 mg | ORAL_TABLET | ORAL | 0 refills | Status: SS | PRN
Start: 2018-03-16 — End: 2018-03-23

## 2018-03-16 MED ORDER — MORPHINE 60 MG PO TBER
60 mg | ORAL_TABLET | ORAL | 0 refills | 7.00000 days | Status: AC
Start: 2018-03-16 — End: 2018-03-23

## 2018-03-16 MED ORDER — MORPHINE 15 MG PO TAB
15-30 mg | ORAL | 0 refills | Status: DC | PRN
Start: 2018-03-16 — End: 2018-03-18
  Administered 2018-03-17: 08:00:00 30 mg via ORAL

## 2018-03-16 MED ORDER — MORPHINE 15 MG PO TBER
15 mg | ORAL_TABLET | ORAL | 0 refills | 7.00000 days | Status: AC
Start: 2018-03-16 — End: 2018-03-17
  Filled 2018-03-18: qty 60, 20d supply, fill #1

## 2018-03-16 NOTE — Progress Notes
Respirations: 18 PER MINUTE (02/20 0812)  SpO2: 93 % (02/20 0812) BP: (108-129)/(65-79)   Temp:  [36.4 ???C (97.6 ???F)-37 ???C (98.6 ???F)]   Pulse:  [73-119]   Respirations:  [16 PER MINUTE-20 PER MINUTE]   SpO2:  [93 %-96 %]    Intensity Pain Scale (Self Report): 5 (03/16/18 0858) Vitals:    03/12/18 1718   Weight: 67.5 kg (148 lb 12.8 oz)       Intake/Output Summary:  (Last 24 hours)    Intake/Output Summary (Last 24 hours) at 03/16/2018 1029  Last data filed at 03/16/2018 0858  Gross per 24 hour   Intake 0 ml   Output 0 ml   Net 0 ml      Stool Occurrence: 0    Physical Exam  General appearance: fatigued, well-developed, cooperative and mild distress  Neurologic: Grossly normal  Lungs: Bilateral reduced breath sounds in both bases dull percussion noted right base to mid lobe on the right side  Heart: regular rate and rhythm, S1, S2 normal, no murmur, click, rub or gallop  Abdomen: soft, non-tender. Bowel sounds normal. No masses,  no organomegaly  Extremities: extremities normal, atraumatic, no cyanosis or edema     Lab Review  CBC w/Diff    Lab Results   Component Value Date/Time    WBC 3.6 (L) 03/15/2018 07:19 AM    RBC 3.31 (L) 03/15/2018 07:19 AM    HGB 9.2 (L) 03/15/2018 07:19 AM    HCT 27.8 (L) 03/15/2018 07:19 AM    MCV 84.0 03/15/2018 07:19 AM    MCH 27.9 03/15/2018 07:19 AM    MCHC 33.2 03/15/2018 07:19 AM    RDW 14.8 03/15/2018 07:19 AM    PLTCT 335 03/15/2018 07:19 AM    MPV 7.8 03/15/2018 07:19 AM    Lab Results   Component Value Date/Time    NEUT 75 03/15/2018 07:19 AM    ANC 2.70 03/15/2018 07:19 AM    LYMA 10 (L) 03/15/2018 07:19 AM    ALC 0.40 (L) 03/15/2018 07:19 AM    MONA 14 (H) 03/15/2018 07:19 AM    AMC 0.50 03/15/2018 07:19 AM    EOSA 0 03/15/2018 07:19 AM    AEC 0.00 03/15/2018 07:19 AM    BASA 1 03/15/2018 07:19 AM    ABC 0.00 03/15/2018 07:19 AM        Comprehensive Metabolic Profile    Lab Results   Component Value Date/Time    NA 143 03/15/2018 07:19 AM    K 3.2 (L) 03/15/2018 07:19 AM

## 2018-03-16 NOTE — Progress Notes
PALLIATIVE CARE INPATIENT PROGRESS NOTE     Date of Service: 03/16/18  LOS: 4     ASSESSMENT/PLAN     Donna Tyler???is a 53 y.o.???female???with metastatic breast cancer admitted with acute abdominal pain and shortness of breath.???Palliative care is following for symptoms (pain) and goals of care.     Symptoms:    1. Pain:  MS Contin 75mg  three times per day  MS IR 30-45mg  po Q 3 hours PRN - required 4 doses (150mg ) in the past 24 hours.  Bentyl 10mg  po Q 3 hours PRN for spasm type pain - no doses required in the past 24 hours.  Recs:  Continue current analgesic regimen today.  Patient encouraged to be up and around to mimic level of activity that will be required of her at home.        Psychosocial Issues related to pain and progression of breast cancer:  Patient reports she is feeling better on current regimen of analgesics.  Says she thinks she is getting close to being able to be ok at home but not there yet.  We discussed her sleepiness - asked her to really pay attention to this today and if she feels she is too sleepy we can reduce the dose of her long acting opioid (maybe decrease am dose only) to see if that allows her to feel more wakeful and her pain is still controlled.  Will follow up in the am.        PRACTICAL & DISPOSITION PLANNING     Practical Issues:  Contacts: : Daughter: Donna Tyler: [phone]  Mother:Donna Tyler: [phone].    Current PPS%:  60    Prognosis (Estimated):  Based on current function, current medical issues, goals of care, and prognosis models, my estimated prognosis is Unknown.    Goals: DNAR-FI    Disposition: Continue current care based on goals with plan to return to her home      SUBJECTIVE     CC/Reason for Visit: Symptoms (pain)    Subjective/HPI: Rates pain as 4 to 5/10 today and says this is a number she considers to be pretty good.  Says I think we are dialing it in.  She says she does not feel overly sleepy but drifts off during conversation.

## 2018-03-17 ENCOUNTER — Encounter: Admit: 2018-03-17 | Discharge: 2018-03-17 | Payer: Commercial Managed Care - HMO

## 2018-03-17 MED ORDER — MORPHINE 15 MG PO TBER
15 mg | ORAL_TABLET | ORAL | 0 refills | 7.00000 days | Status: AC
Start: 2018-03-17 — End: 2018-03-17

## 2018-03-17 MED ORDER — MORPHINE 15 MG PO TBER
15 mg | ORAL_TABLET | ORAL | 0 refills | 7.00000 days | Status: AC
Start: 2018-03-17 — End: 2018-03-23
  Filled 2018-03-18: qty 60, 20d supply, fill #1

## 2018-03-17 NOTE — Progress Notes
MONA 14 (H) 03/15/2018 07:19 AM    AMC 0.50 03/15/2018 07:19 AM    EOSA 0 03/15/2018 07:19 AM    AEC 0.00 03/15/2018 07:19 AM    BASA 1 03/15/2018 07:19 AM    ABC 0.00 03/15/2018 07:19 AM        Comprehensive Metabolic Profile    Lab Results   Component Value Date/Time    NA 143 03/15/2018 07:19 AM    K 3.2 (L) 03/15/2018 07:19 AM    CL 109 03/15/2018 07:19 AM    CO2 23 03/15/2018 07:19 AM    GAP 11 03/15/2018 07:19 AM    BUN 8 03/15/2018 07:19 AM    CR 0.69 03/15/2018 07:19 AM    GLU 92 03/15/2018 07:19 AM    Lab Results   Component Value Date/Time    CA 7.9 (L) 03/15/2018 07:19 AM    PO4 3.5 03/12/2018 07:00 PM    ALBUMIN 3.3 (L) 03/15/2018 07:19 AM    TOTPROT 6.0 03/15/2018 07:19 AM    ALKPHOS 87 03/15/2018 07:19 AM    AST 39 03/15/2018 07:19 AM    ALT 19 03/15/2018 07:19 AM    TOTBILI 0.2 (L) 03/15/2018 07:19 AM    GFR >60 03/15/2018 07:19 AM    GFRAA >60 03/15/2018 07:19 AM          Point of Care Testing  (Last 24 hours)  Glucose: 92  POC Glucose (Download): 100    Radiology and other Diagnostics Review:      CXR - Impression     Bilateral pleural effusions.  Right paratracheal adenopathy.  ???  CT Chest PE protocol    Large bilateral pleural effusions.  No pulmonary emboli.     Extensive mediastinal adenopathy consistent with metastatic disease.     Metastatic bone disease.  Bibasilar consolidation consistent with atelectasis.  ???  CT Abdomen/Pelvis    Diffuse metastatic involvement of the right lobe of the liver and caudate lobe.     Extensive retroperitoneal adenopathy.  Small amount of ascites.     Poorly defined enhancing multinodular area in the right hemipelvis suspicious for metastatic involvement right ovary and or the adjacent tissue around the right ovary.     Diffuse mesenteric edema and diffuse anasarca.      Donna Tyler, MBChB   Med Private P  Pg [phone]

## 2018-03-18 ENCOUNTER — Inpatient Hospital Stay: Admit: 2018-03-13 | Discharge: 2018-03-13 | Payer: Commercial Managed Care - HMO

## 2018-03-18 ENCOUNTER — Encounter: Admit: 2018-03-18 | Discharge: 2018-03-18 | Payer: Commercial Managed Care - HMO

## 2018-03-18 ENCOUNTER — Emergency Department: Admit: 2018-03-18 | Discharge: 2018-03-18 | Payer: Commercial Managed Care - HMO

## 2018-03-18 ENCOUNTER — Inpatient Hospital Stay: Admit: 2018-03-14 | Discharge: 2018-03-14 | Payer: Commercial Managed Care - HMO

## 2018-03-18 DIAGNOSIS — Z87891 Personal history of nicotine dependence: ICD-10-CM

## 2018-03-18 DIAGNOSIS — C787 Secondary malignant neoplasm of liver and intrahepatic bile duct: ICD-10-CM

## 2018-03-18 DIAGNOSIS — J90 Pleural effusion, not elsewhere classified: Principal | ICD-10-CM

## 2018-03-18 DIAGNOSIS — C779 Secondary and unspecified malignant neoplasm of lymph node, unspecified: ICD-10-CM

## 2018-03-18 DIAGNOSIS — Z515 Encounter for palliative care: ICD-10-CM

## 2018-03-18 DIAGNOSIS — Z9011 Acquired absence of right breast and nipple: ICD-10-CM

## 2018-03-18 DIAGNOSIS — C50919 Malignant neoplasm of unspecified site of unspecified female breast: ICD-10-CM

## 2018-03-18 DIAGNOSIS — G893 Neoplasm related pain (acute) (chronic): ICD-10-CM

## 2018-03-18 DIAGNOSIS — F329 Major depressive disorder, single episode, unspecified: ICD-10-CM

## 2018-03-18 DIAGNOSIS — Z170 Estrogen receptor positive status [ER+]: ICD-10-CM

## 2018-03-18 DIAGNOSIS — C7951 Secondary malignant neoplasm of bone: ICD-10-CM

## 2018-03-18 DIAGNOSIS — Z66 Do not resuscitate: ICD-10-CM

## 2018-03-18 DIAGNOSIS — E876 Hypokalemia: ICD-10-CM

## 2018-03-18 LAB — URINALYSIS DIPSTICK
Lab: NEGATIVE MMOL/L (ref 21–30)
Lab: NEGATIVE U/L — ABNORMAL HIGH (ref 25–110)
Lab: NEGATIVE mL/min (ref 0–0.80)
Lab: NEGATIVE mL/min — ABNORMAL LOW (ref 1.0–4.8)

## 2018-03-18 LAB — POC TROPONIN: Lab: 0 ng/mL — ABNORMAL LOW (ref 0.00–0.05)

## 2018-03-18 LAB — POC LACTATE: Lab: 1.3 MMOL/L — ABNORMAL HIGH (ref 0.5–2.0)

## 2018-03-18 LAB — COMPREHENSIVE METABOLIC PANEL: Lab: 141 MMOL/L (ref 137–147)

## 2018-03-18 LAB — URINALYSIS, MICROSCOPIC

## 2018-03-18 LAB — MAGNESIUM: Lab: 1.7 mg/dL (ref 1.6–2.6)

## 2018-03-18 LAB — CBC AND DIFF
Lab: 0.1 10*3/uL (ref 0–0.20)
Lab: 6.1 10*3/uL (ref 4.5–11.0)

## 2018-03-18 LAB — PHOSPHORUS: Lab: 1.1 mg/dL — CL (ref 2.0–4.5)

## 2018-03-18 LAB — LIPASE: Lab: 20 U/L — ABNORMAL HIGH (ref 11–82)

## 2018-03-18 MED ORDER — IMS MIXTURE TEMPLATE
75 mg | ORAL | 0 refills | Status: CP
Start: 2018-03-18 — End: ?
  Administered 2018-03-18 – 2018-03-22 (×22): 75 mg via ORAL

## 2018-03-18 MED ORDER — POTASSIUM CHLORIDE 20 MEQ PO TBTQ
60 meq | Freq: Once | ORAL | 0 refills | Status: CP
Start: 2018-03-18 — End: ?
  Administered 2018-03-19: 04:00:00 60 meq via ORAL

## 2018-03-18 MED ORDER — ONDANSETRON HCL (PF) 4 MG/2 ML IJ SOLN
4 mg | Freq: Once | INTRAVENOUS | 0 refills | Status: DC
Start: 2018-03-18 — End: 2018-03-18

## 2018-03-18 MED ORDER — IOHEXOL 350 MG IODINE/ML IV SOLN
65 mL | Freq: Once | INTRAVENOUS | 0 refills | Status: CP
Start: 2018-03-18 — End: ?
  Administered 2018-03-18: 16:00:00 65 mL via INTRAVENOUS

## 2018-03-18 MED ORDER — MORPHINE 2 MG/ML IV CRTG
4 mg | Freq: Once | INTRAVENOUS | 0 refills | Status: CP
Start: 2018-03-18 — End: ?
  Administered 2018-03-18: 20:00:00 4 mg via INTRAVENOUS

## 2018-03-18 MED ORDER — HYDROMORPHONE (PF) 2 MG/ML IJ SYRG
1 mg | Freq: Once | INTRAVENOUS | 0 refills | Status: DC
Start: 2018-03-18 — End: 2018-03-18

## 2018-03-18 MED ORDER — DOCUSATE SODIUM 100 MG PO CAP
200 mg | Freq: Every day | ORAL | 0 refills | Status: DC
Start: 2018-03-18 — End: 2018-03-21
  Administered 2018-03-18: 22:00:00 200 mg via ORAL

## 2018-03-18 MED ORDER — POLYETHYLENE GLYCOL 3350 17 GRAM PO PWPK
1 | Freq: Three times a day (TID) | ORAL | 0 refills | Status: DC
Start: 2018-03-18 — End: 2018-03-23
  Administered 2018-03-18 – 2018-03-20 (×4): 17 g via ORAL

## 2018-03-18 MED ORDER — MORPHINE 4 MG/ML IV CRTG
8 mg | Freq: Once | INTRAVENOUS | 0 refills | Status: CP
Start: 2018-03-18 — End: ?
  Administered 2018-03-18: 17:00:00 8 mg via INTRAVENOUS

## 2018-03-18 MED ORDER — PANTOPRAZOLE 40 MG IV SOLR
40 mg | Freq: Every day | INTRAVENOUS | 0 refills | Status: DC
Start: 2018-03-18 — End: 2018-03-21
  Administered 2018-03-18 – 2018-03-21 (×4): 40 mg via INTRAVENOUS

## 2018-03-18 MED ORDER — SODIUM CHLORIDE 0.9 % IJ SOLN
50 mL | Freq: Once | INTRAVENOUS | 0 refills | Status: CP
Start: 2018-03-18 — End: ?
  Administered 2018-03-18: 16:00:00 50 mL via INTRAVENOUS

## 2018-03-18 MED ORDER — POTASSIUM, SODIUM PHOSPHATES 280-160-250 MG PO PWPK
2 | Freq: Once | ORAL | 0 refills | Status: CP
Start: 2018-03-18 — End: ?
  Administered 2018-03-18: 17:00:00 500 mg via ORAL

## 2018-03-18 MED ORDER — ENOXAPARIN 40 MG/0.4 ML SC SYRG
40 mg | Freq: Every day | SUBCUTANEOUS | 0 refills | Status: DC
Start: 2018-03-18 — End: 2018-03-23
  Administered 2018-03-19 – 2018-03-23 (×4): 40 mg via SUBCUTANEOUS

## 2018-03-18 MED ORDER — LACTATED RINGERS IV SOLP
INTRAVENOUS | 0 refills | Status: DC
Start: 2018-03-18 — End: 2018-03-19
  Administered 2018-03-19: 04:00:00 1000.000 mL via INTRAVENOUS

## 2018-03-18 MED ORDER — POTASSIUM, SODIUM PHOSPHATES 280-160-250 MG PO PWPK
2 | Freq: Once | ORAL | 0 refills | Status: CP
Start: 2018-03-18 — End: ?
  Administered 2018-03-19: 04:00:00 500 mg via ORAL

## 2018-03-18 MED ORDER — ACETAMINOPHEN 325 MG PO TAB
650 mg | ORAL | 0 refills | Status: DC | PRN
Start: 2018-03-18 — End: 2018-03-23

## 2018-03-18 MED ORDER — SODIUM CHLORIDE 0.9 % IV SOLP
1000 mL | INTRAVENOUS | 0 refills | Status: DC
Start: 2018-03-18 — End: 2018-03-19
  Administered 2018-03-19: 01:00:00 1000 mL via INTRAVENOUS

## 2018-03-18 MED ORDER — MORPHINE 2 MG/ML IV CRTG
4 mg | Freq: Once | INTRAVENOUS | 0 refills | Status: CP
Start: 2018-03-18 — End: ?
  Administered 2018-03-18: 15:00:00 4 mg via INTRAVENOUS

## 2018-03-18 MED ORDER — DIPHENHYDRAMINE HCL 25 MG PO CAP
25-50 mg | ORAL | 0 refills | Status: DC | PRN
Start: 2018-03-18 — End: 2018-03-23
  Administered 2018-03-22 – 2018-03-23 (×2): 50 mg via ORAL

## 2018-03-18 MED ORDER — POTASSIUM CHLORIDE IN WATER 10 MEQ/50 ML IV PGBK
10 meq | INTRAVENOUS | 0 refills | Status: CP
Start: 2018-03-18 — End: ?
  Administered 2018-03-18 (×3): 10 meq via INTRAVENOUS

## 2018-03-18 MED ORDER — SODIUM CHLORIDE 0.9 % IV SOLP
1000 mL | INTRAVENOUS | 0 refills | Status: DC
Start: 2018-03-18 — End: 2018-03-18
  Administered 2018-03-18: 17:00:00 1000 mL via INTRAVENOUS

## 2018-03-18 MED ORDER — PROCHLORPERAZINE EDISYLATE 5 MG/ML IJ SOLN
10 mg | Freq: Once | INTRAVENOUS | 0 refills | Status: CP
Start: 2018-03-18 — End: ?
  Administered 2018-03-18: 20:00:00 10 mg via INTRAVENOUS

## 2018-03-18 MED ORDER — PROCHLORPERAZINE EDISYLATE 5 MG/ML IJ SOLN
10 mg | INTRAVENOUS | 0 refills | Status: DC | PRN
Start: 2018-03-18 — End: 2018-03-20
  Administered 2018-03-20: 15:00:00 10 mg via INTRAVENOUS

## 2018-03-18 MED ORDER — SERTRALINE 100 MG PO TAB
100 mg | Freq: Two times a day (BID) | ORAL | 0 refills | Status: DC
Start: 2018-03-18 — End: 2018-03-23
  Administered 2018-03-19 – 2018-03-23 (×10): 100 mg via ORAL

## 2018-03-18 MED ORDER — MORPHINE 15 MG PO TAB
30-45 mg | ORAL | 0 refills | Status: DC | PRN
Start: 2018-03-18 — End: 2018-03-22
  Administered 2018-03-20 – 2018-03-21 (×3): 30 mg via ORAL
  Administered 2018-03-21: 04:00:00 45 mg via ORAL
  Administered 2018-03-22: 18:00:00 15 mg via ORAL
  Administered 2018-03-22: 16:00:00 30 mg via ORAL

## 2018-03-18 MED ORDER — MORPHINE 4 MG/ML IV CRTG
4-6 mg | INTRAVENOUS | 0 refills | Status: DC | PRN
Start: 2018-03-18 — End: 2018-03-20

## 2018-03-18 MED ORDER — LACTATED RINGERS IV SOLP
1000 mL | INTRAVENOUS | 0 refills | Status: CP
Start: 2018-03-18 — End: ?
  Administered 2018-03-18: 15:00:00 1000 mL via INTRAVENOUS

## 2018-03-18 MED ORDER — MAGNESIUM SULFATE IN D5W 1 GRAM/100 ML IV PGBK
1 g | INTRAVENOUS | 0 refills | Status: CP
Start: 2018-03-18 — End: ?
  Administered 2018-03-19 (×2): 1 g via INTRAVENOUS

## 2018-03-18 NOTE — Care Plan
RT Adult Assessment Note    NAME:Donna Tyler             MRN: [id number]             DOB:[birth year]          AGE: 53 y.o.  ADMISSION DATE: 03/18/2018             DAYS ADMITTED: LOS: 0 days    RT Treatment Plan:       Protocol Plan: Procedures  PAP: Q4h PAP While Awake  Oxygen/Humidity: O2 to keep SpO2 > 92%  Monitoring: Pulse oximetry BID & PRN    Additional Comments:  Impressions of the patient: pt has labored breathing then pulmonary came in to do a thoracentesis. RT will check back on pt.      Vital Signs:  Pulse: 92  RR: 20 PER MINUTE  SpO2: 95 %  O2 Device: Cannula  Liter Flow: 2 Lpm  O2%:    Breath Sounds: Decreased  Respiratory Effort: Non-Labored

## 2018-03-18 NOTE — ED Notes
Pt resting in bed in NAD with eyes closed. Respirations even and unlabored. Bed in lowest locked position, and call light is in reach.

## 2018-03-18 NOTE — ED Notes
Pt taken to room by transport with all belongings. Pt preferred to keep oxygen on for comfort.

## 2018-03-18 NOTE — ED Notes
Report given to Marily, RN

## 2018-03-18 NOTE — ED Notes
Pt taken to CT.

## 2018-03-18 NOTE — Discharge Instructions - Pharmacy
patient's known mediastinal lymphadenopathy.    Brief Hospital Course:  53 year old F known depressive illness and metastatic breast CA admitted from an OSH ER with acute abdominal pain and dyspnea.  CT scan from the referring facility showed a large pleural effusion and was negative for pulmonary emboli.  There was extensive mediastinal lymphadenopathy.  With concerns for a malignant effusion patient underwent thoracentesis successfully and managed to be weaned off oxygen.  She was initially on morphine PCA then switched over to long-acting morphine and oxycodone with better control of her pain.  Patient underwent an EBUS with hilar lymph node biopsy to obtain additional material to check for breast markers to be followed up by her primary oncologist in the outpatient.  She was discharged in stable condition to follow-up with oncology in the outpatient as previously scheduled.    Condition at Discharge: Stable    Discharge Diagnoses:      Breast CA   Chronic abdominal pain   Pleural effusion    Surgical Procedures: None    Significant Diagnostic Studies and Procedures: noted in brief hospital course    Consults:  Pulmonary    Patient Disposition: Home       Patient instructions/medications:       Activity as Tolerated    It is important to keep increasing your activity level after you leave the hospital.  Moving around can help prevent blood clots, lung infection (pneumonia) and other problems.  Gradually increasing the number of times you are up moving around will help you return to your normal activity level more quickly.  Continue to increase the number of times you are up to the chair and walking daily to return to your normal activity level. Begin to work toward your normal activity level at discharge     Report These Signs and Symptoms    Please contact your doctor if you have any of the following symptoms: uncontrolled pain     Questions About Your Stay every 6 hours as needed for Nausea or Vomiting.  Qty: 30 tablet, Refills: 0    PRESCRIPTION TYPE:  Normal      sertraline (ZOLOFT) 100 mg tablet Take one tablet by mouth daily.  Qty: 90 tablet, Refills: 0    PRESCRIPTION TYPE:  Normal      triamcinolone acetonide (KENALOG) 0.025 % topical cream Apply  topically to affected area twice daily.  Qty: 80 g, Refills: 0    PRESCRIPTION TYPE:  Normal          The following medications were removed from your list. This list includes medications discontinued this stay and those removed from your prior med list in our system        oxyCODONE (ROXICODONE) 10 mg tablet            Scheduled appointments:    Mar 22, 2018  1:30 PM CST  (Arrive by 1:15 PM)  New Patient with Reola Mosher, MD  [facility]) [address] [place] [address]  [phone]   Jun 22, 2018  9:00 AM CDT  (Arrive by 8:45 AM)  Genetics New Consult with Salvatore Marvel, MS CGC  [facility] [place] [facility] Exam) [facility]  [address] [place] [address]  [phone]   Jun 22, 2018 10:00 AM CDT  (Arrive by 9:45 AM)  Lab with LL2 PHLEBOTOMY CHAIR  [facility] [employer] (  [place] Exam) [facility]  [address] [place] [address]  [phone]   Jul 20, 2018 10:00 AM CDT  (Arrive by 9:45 AM)  Genetic Follow Up with Salvatore Marvel, MS CGC  [facility] Exam) [facility]  [address] [place] [address]  [phone]          Pending items needing follow up: None    Signed:  Lowella Dell, MBChB  03/18/2018      cc:  Primary Care Physician:  Cecil Cranker   Verified  Referring physicians:  Benita Gutter, MD   Additional provider(s):

## 2018-03-18 NOTE — Other
space, diaphragm and atelectatic lung were identified.  A safe site for pleural access was marked.  Post-proedure, [place] was performed and demonstrated no residual fluid - complete drainage accomplished.  + lung sliding identified.    Procedure performed by Dr. Orlan Leavens with my supervision    Lysbeth Galas, MD

## 2018-03-18 NOTE — H&P (View-Only)
Admission History and Physical Examination      Name:  Donna Tyler                                             MRN:  [id number]   Admission Date:  03/18/2018                     Assessment/Plan:    Active Problems:    Metastatic breast cancer (HCC)    Osseous metastasis (HCC)    Bilateral pleural effusion    Generalized abdominal pain    Hypokalemia    Hypophosphatemia      53 year old F with metastatic breast cancer with recent admission for pain and dyspnea readmitted for uncontrolled abdominal pain, dyspnea, nausea, and vomiting.  ???  Uncontrolled Abdominal Pain, Nausea, Vomiting  - CT A/p on admit - slight increase in RLQ mass (ovarian met or primary neoplasm), slight increase in periportal and RP lymph node mets with encasement and severe narrowing of IVC, increase in R inferior hepatic lobe mass with ill-defined linear hypodensities (biliary dilatation or peripheral thrombus), persistent mild loculated ascites and areas of peritoneal thickening and nodularity, no sig change in osseous mets  - Lipase nml.  AST and Alk Phos minimal elevation, likely from hepatic mets - monitor  - In the background of known breast CA, pain is likely related to metastases  - Recently discharged 2/21 for admission for uncontrolled pain and dyspnea; Palliative assisted with symptom mgmt  - Discharged with MS Contin 75mg  Q8, MS IR 30-45mg  Q3 PRN  - Given IV Morphine in ER  - Continue home PO regimen as tolerated  - Use IV Morphine PRN 4-6mg  Q4 PRN - can adjust as needed  - IV Compazine PRN  - Bowel regimen with Miralax  - IVF  - Palliative consulted  ???  Dyspnea, R Sided Chest Pain, Cough  - Had pleural effusion and underwent thoracentesis by Pulm on 03/13/2018  - Reports worsening dyspnea since discharge on 2/21  - Placed on 2L O2 in ER although no documented desaturation; now removed  - CTA chest on admit - no PE, scattered patchy GGO throughout both lungs - atelectasis vs pneumonitis vs drug reaction, unchanged moderate R and partially loculated L pleural efffusions with adjacent atelectasis and near complete collapse of RLL  - No infectious symptoms or leukocytosis.  Hold on antibiotics.  Obtain Procalcitonin  - Trop neg.  EKG sinus tachy, no acute ST-T changes, prolonged QTc 520  - Obtain Echo and BNP; has some mild leg edema  - Obtain BLE Dopplers given leg edema  - Telemetry  - Pulm reconsulted  ???  Metastatic Breast Cancer  - Metastatic breast CA ER positive PR positive HER-2 negative by FISH, diagnosed 2015  - Follows with Dr. [facility]io [facility] at [facility], last seen 02/21/2018  - Initially treated with mastectomy, BSO, chemo AC followed by Taxol, then on Tamoxifen  - Recurrent 2018 with metastatic disease - treated with Faslodex, palbociclib, denosumab, and radiation  - Changed to exemestane and everolimus 10/2017 due to progression  - Progression in 01/2018 so now reassessing with new pathology and considering clinical trial; Exemestane and Everolimus now stopped  - During last admit, Pulm performed both thoracentesis and bronch with biopsy to check for PDL 1 possibly microsatellite instability and NTRK fusion gene testing  - Cytology still pending  from pleural fluid    Hypokalemia, Hypophosphatemia  - Replacement given in ER  - Monitor and replace lytes as needed  - Prolonged QT noted on EKG - will repeat later today after K & Phos replacement; monitoring on tele  ???  Depression   - Continue Zoloft  ???  FEN   - CLD, advance as tolerated  - IVF - NS @ 75 cc/hr  ???  Proph  - SCDs, Lovenox    Dispo  - Code status: [id number], discussed with patient on admit  - Admit to IM    __________________________________________________________________________________  Primary Care Physician: Cecil Cranker     Chief Complaint:  Abdominal pain, dyspnea    History of Present Illness: Donna Tyler is a 53 y.o. female presents to the ER with complaints of abdominal pain, vomiting, and shortness of breath.  She was recently admitted for the same symptoms and was discharged to home yesterday.  During her last admission she underwent a thoracentesis and bronchoscopy with biopsy by Pulmonary0.  Palliative care was assisting with pain management.  She reports her symptoms have been controlled on oral medications yesterday however upon arrival to home in the evening she had worsening diffuse abdominal pain but more so on the right side as well as pain on the right side of her chest which worsens with cough and deep breaths.  She had 3 episodes of nonbloody vomiting yesterday and 1 thus far today.  She tried taking her oral home pain medications but her symptoms were not relieved.  Her abdominal pain is up to a 7 out of 10 and improved to 5 out of 10 after IV pain medications in the ER.  She reports worsening shortness of breath at rest and with lying flat.  She reports a nonproductive cough.  No fevers or chills.  No URI symptoms.  She does note swelling in both of her legs.  She is unsure about any weight change.  She reports her bowel movements have been normal and she has had 2 thus far today.  She does not feel constipated.  She has no dysuria.    Medical History:   Diagnosis Date   ??? Chronic pain    ??? Constipation    ??? Depression    ??? Gravida 2 para 2    ??? History of pathologic fracture    ??? Metastatic breast cancer (HCC)    ??? SBP (spontaneous bacterial peritonitis) (HCC)     MSSA, August 2018     Surgical History:   Procedure Laterality Date   ??? BRONCHOSCOPY DIAGNOSTIC WITH CELL WASHING - FLEXIBLE N/A 03/14/2018    Performed by Lorna Few, MD at Main OR/Periop   ??? BRONCHOSCOPY WITH TRANSBRONCHIAL NEEDLE ASPIRATION AND BIOPSY TRACHEA/ MAIN STEM/ LOBAR BRONCHUS - FLEXIBLE N/A 03/14/2018    Performed by Lorna Few, MD at [facility] OR/Periop   ??? BRONCHOSCOPY WITH ENDOBRONCHIAL ULTRASOUND GUIDED TRANSTRACHEAL/ TRANSBRONCHIAL SAMPLING - 3 OR MORE MEDIASTINAL/ HILAR LYMPH NODE STATIONS/ STRUCTURE - FLEXIBLE N/A 03/14/2018    Performed by Lorna Few, MD at [facility] OR/Periop   ??? ABDOMINOPLASTY      Remote history   ??? BIOPSY MASS      Pelvis   ??? BREAST BIOPSY     ??? BREAST SURGERY      Remote history of breast lift   ??? CESAREAN SECTION      X2   ??? MASTECTOMY Right     Axillary lymph node dissection   ???  PARACENTESIS      Multiple   ??? PERITONEAL CATHETER INSERTION      And subsequent removal   ??? SALPINGO-OOPHORECTOMY Bilateral    ??? WRIST SURGERY Right     Cyst removal     Family History   Problem Relation Age of Onset   ??? Cancer Neg Hx         Denies any family history of breast, ovarian, pancreas cancer or melanoma or other cancer     Social History     Socioeconomic History   ??? Marital status: Single     Spouse name: Not on file   ??? Number of children: Not on file   ??? Years of education: Not on file   ??? Highest education level: Not on file   Occupational History   ??? Not on file   Tobacco Use   ??? Smoking status: Former Smoker     Packs/day: 0.50     Years: 30.00     Pack years: 15.00     Types: Cigarettes   ??? Smokeless tobacco: Never Used   ??? Tobacco comment: Quit in 2015 after 30+ years   Substance and Sexual Activity   ??? Alcohol use: Not Currently     Frequency: Never   ??? Drug use: No   ??? Sexual activity: Not on file   Other Topics Concern   ??? Not on file   Social History Narrative    Has been a traveling ultrasonographer for vascular clinics, 2 kids that are grown, she lives alone and is pending her third divorce currently      Immunizations (includes history and patient reported):   There is no immunization history on file for this patient.        Allergies:  Patient has no known allergies.    Medications:  Current Facility-Administered Medications   Medication   ??? sodium chloride 0.9 %   infusion     Current Outpatient Medications   Medication Sig   ??? calcium carbonate/vitamin D-3 (OS-CAL 500+D) 1250 mg/200 unit tablet Take one tablet by mouth twice daily with meals. Calcium Carb 1250mg  Pulse: 104 (02/22 1339)  Respirations: 17 PER MINUTE (02/22 1339)  SpO2: 94 % (02/22 1339)  SpO2 Pulse: 104 (02/22 1339) BP: (92-153)/(70-92)   Temp:  [36.6 ???C (97.8 ???F)-36.6 ???C (97.9 ???F)]   Pulse:  [82-104]   Respirations:  [11 PER MINUTE-26 PER MINUTE]   SpO2:  [94 %-99 %]    Intensity Pain Scale (Self Report): 7 (03/18/18 0904)      Gen - Alert, mild discomfort from pain, cooperative  HEENT - NC/AT, PERRL, EOMI, sclera anicteric, OP clear, MMM  Neck - Supple, no LAD  Chest - Decreased BS at R base.  No wheezing  CV - Tachy, RR, no m/r/g  Abd - Soft, diffusely TTP with guarding, no rebound, ND, +BS  Ext - 1+ BLE pitting edema, warm, well-perfused  Skin - No rash  Neuro - No focal deficit    Lab/Radiology/Other Diagnostic Tests:  24-hour labs:    Results for orders placed or performed during the hospital encounter of 03/18/18 (from the past 24 hour(s))   CBC AND DIFF    Collection Time: 03/18/18  8:41 AM   Result Value Ref Range    White Blood Cells 6.1 4.5 - 11.0 K/UL    RBC 4.11 4.0 - 5.0 M/UL    Hemoglobin 11.2 (L) 12.0 - 15.0 GM/DL    Hematocrit 16.1 (L) 36 -  45 %    MCV 83.0 80 - 100 FL    MCH 27.4 26 - 34 PG    MCHC 33.0 32.0 - 36.0 G/DL    RDW 42.7 11 - 15 %    Platelet Count 421 (H) 150 - 400 K/UL    MPV 8.0 7 - 11 FL    Neutrophils 86 (H) 41 - 77 %    Lymphocytes 7 (L) 24 - 44 %    Monocytes 6 4 - 12 %    Eosinophils 0 0 - 5 %    Basophils 1 0 - 2 %    Absolute Neutrophil Count 5.30 1.8 - 7.0 K/UL    Absolute Lymph Count 0.40 (L) 1.0 - 4.8 K/UL    Absolute Monocyte Count 0.30 0 - 0.80 K/UL    Absolute Eosinophil Count 0.00 0 - 0.45 K/UL    Absolute Basophil Count 0.10 0 - 0.20 K/UL   COMPREHENSIVE METABOLIC PANEL    Collection Time: 03/18/18  8:41 AM   Result Value Ref Range    Sodium 141 137 - 147 MMOL/L    Potassium 3.2 (L) 3.5 - 5.1 MMOL/L    Chloride 102 98 - 110 MMOL/L    Glucose 102 (H) 70 - 100 MG/DL    Blood Urea Nitrogen 6 (L) 7 - 25 MG/DL    Creatinine 0.62 0.4 - 1.00 MG/DL Calcium 8.7 8.5 - 37.6 MG/DL    Total Protein 7.3 6.0 - 8.0 G/DL    Total Bilirubin 0.3 0.3 - 1.2 MG/DL    Albumin 3.9 3.5 - 5.0 G/DL    Alk Phosphatase 283 (H) 25 - 110 U/L    AST (SGOT) 49 (H) 7 - 40 U/L    CO2 21 21 - 30 MMOL/L    ALT (SGPT) 26 7 - 56 U/L    Anion Gap 18 (H) 3 - 12    eGFR Non African American >60 >60 mL/min    eGFR African American >60 >60 mL/min   LIPASE    Collection Time: 03/18/18  8:41 AM   Result Value Ref Range    Lipase 20 11 - 82 U/L   MAGNESIUM    Collection Time: 03/18/18  8:41 AM   Result Value Ref Range    Magnesium 1.7 1.6 - 2.6 mg/dL   PHOSPHORUS    Collection Time: 03/18/18  8:41 AM   Result Value Ref Range    Phosphorus 1.1 (LL) 2.0 - 4.5 MG/DL   POC TROPONIN    Collection Time: 03/18/18  8:51 AM   Result Value Ref Range    Troponin-I-POC 0.00 0.00 - 0.05 NG/ML   POC LACTATE    Collection Time: 03/18/18  8:54 AM   Result Value Ref Range    LACTIC ACID POC 1.3 0.5 - 2.0 MMOL/L   URINALYSIS DIPSTICK    Collection Time: 03/18/18 10:00 AM   Result Value Ref Range    Color,UA YELLOW     Turbidity,UA CLEAR CLEAR-CLEAR    Specific Gravity-Urine 1.013 1.003 - 1.035    pH,UA 6.0 5.0 - 8.0    Protein,UA 2+ (A) NEG-NEG    Glucose,UA NEG NEG-NEG    Ketones,UA 2+ (A) NEG-NEG    Bilirubin,UA NEG NEG-NEG    Blood,UA 1+ (A) NEG-NEG    Urobilinogen,UA NORMAL NORM-NORMAL    Nitrite,UA NEG NEG-NEG    Leukocytes,UA NEG NEG-NEG    Urine Ascorbic Acid, UA NEG NEG-NEG   URINALYSIS, MICROSCOPIC    Collection  Time: 03/18/18 10:00 AM   Result Value Ref Range    WBCs,UA 2-10 0 - 2 /HPF    RBCs,UA 2-10 0 - 3 /HPF    MucousUA TRACE     Squamous Epithelial Cells 0-2 0 - 5     Glucose: (!) 102 (03/18/18 6644)  Pertinent radiology reviewed.  Cta Chest Wo/w Contrast+post Impression    Result Date: 03/18/2018  CHEST: 1.  No evidence of acute pulmonary embolism. 2.  Slight increase in scattered patchy groundglass opacities throughout both lungs, which are

## 2018-03-19 LAB — CULTURE-WOUND/TISSUE/FLUID(AEROBIC ONLY)W/SENSITIVITY

## 2018-03-19 LAB — PLEURAL FLUID LACTATE DEHYDROGENASE: Lab: 176 U/L — ABNORMAL HIGH (ref 67–140)

## 2018-03-19 LAB — BASIC METABOLIC PANEL
Lab: 0.5 mg/dL (ref 0.4–1.00)
Lab: 140 MMOL/L (ref 137–147)
Lab: 17 MMOL/L — ABNORMAL LOW (ref 21–30)
Lab: 17 — ABNORMAL HIGH (ref 3–12)
Lab: 5 mg/dL — ABNORMAL LOW (ref 7–25)
Lab: 7.8 mg/dL — ABNORMAL LOW (ref 8.5–10.6)
Lab: 80 mg/dL (ref 70–100)
Lab: >60 mL/min (ref >60)
Lab: >60 mL/min (ref >60)
Lab: 0.4 mg/dL (ref 0.4–1.00)
Lab: 140 MMOL/L (ref 137–147)
Lab: 16 MMOL/L — ABNORMAL LOW (ref 21–30)
Lab: 17 — ABNORMAL HIGH (ref 3–12)
Lab: 7.3 mg/dL — ABNORMAL LOW (ref 8.5–10.6)
Lab: >60 mL/min (ref >60)
Lab: >60 mL/min (ref >60)
Lab: 0.5 mg/dL — ABNORMAL LOW (ref >60)
Lab: 104 MMOL/L (ref 98–110)
Lab: 12 mg/dL — ABNORMAL HIGH (ref 3–12)
Lab: 138 MMOL/L — ABNORMAL HIGH (ref >60)
Lab: 22 MMOL/L — ABNORMAL HIGH (ref 21–30)
Lab: 3.8 MMOL/L — ABNORMAL HIGH (ref >60)
Lab: 4 mg/dL — ABNORMAL LOW (ref 7–25)
Lab: 7.4 mg/dL — ABNORMAL LOW (ref >60)
Lab: >60 mL/min (ref >60)

## 2018-03-19 LAB — PLEURAL FLUID TOTAL PROTEIN: Lab: 3.4 g/dL — ABNORMAL HIGH (ref <1.1)

## 2018-03-19 LAB — TROPONIN-I: Lab: 0 ng/mL (ref 0.0–0.05)

## 2018-03-19 LAB — PHOSPHORUS
Lab: 1.4 mg/dL — CL (ref 2.0–4.5)
Lab: 1.5 mg/dL — ABNORMAL LOW (ref 2.0–4.5)
Lab: 1.7 mg/dL — ABNORMAL LOW (ref 2.0–4.5)

## 2018-03-19 LAB — MAGNESIUM
Lab: 1.5 mg/dL — ABNORMAL LOW (ref 1.6–2.6)
Lab: 2.3 mg/dL (ref 1.6–2.6)
Lab: 2 mg/dL — ABNORMAL LOW (ref 1.6–2.6)

## 2018-03-19 LAB — LDH-LACTATE DEHYDROGENASE: Lab: 368 U/L — ABNORMAL HIGH (ref 100–210)

## 2018-03-19 LAB — BNP (B-TYPE NATRIURETIC PEPTI): Lab: 129 pg/mL — ABNORMAL HIGH (ref 0–100)

## 2018-03-19 LAB — CBC AND DIFF: Lab: 5.2 K/UL — ABNORMAL HIGH (ref <150)

## 2018-03-19 LAB — BETA HYDROXYBUTYRATE (KETONES): Lab: 4.6 MMOL/L — ABNORMAL HIGH (ref <0.3)

## 2018-03-19 LAB — PROCALCITONIN: Lab: 0 ng/mL (ref <0.11)

## 2018-03-19 LAB — COMPREHENSIVE METABOLIC PANEL: Lab: 142 MMOL/L — ABNORMAL LOW (ref <100)

## 2018-03-19 MED ORDER — ERGOCALCIFEROL (VITAMIN D2) 1,250 MCG (50,000 UNIT) PO CAP
50000 [IU] | ORAL | 0 refills | Status: DC
Start: 2018-03-19 — End: 2018-03-23
  Administered 2018-03-20: 15:00:00 50000 [IU] via ORAL

## 2018-03-19 MED ORDER — POTASSIUM, SODIUM PHOSPHATES 280-160-250 MG PO PWPK
2 | Freq: Once | ORAL | 0 refills | Status: CP
Start: 2018-03-19 — End: ?
  Administered 2018-03-19: 14:00:00 500 mg via ORAL

## 2018-03-19 MED ORDER — POTASSIUM PHOSPHATE IVPB-STD
16 MMOL | Freq: Once | INTRAVENOUS | 0 refills | Status: CP
Start: 2018-03-19 — End: ?
  Administered 2018-03-19 (×2): 16 mmol via INTRAVENOUS

## 2018-03-19 MED ORDER — DEXTROSE 5%-0.45% SODIUM CHLORIDE IV SOLP
INTRAVENOUS | 0 refills | Status: AC
Start: 2018-03-19 — End: ?
  Administered 2018-03-19 – 2018-03-21 (×3): 1000.000 mL via INTRAVENOUS

## 2018-03-19 NOTE — Consults
CONSULT ONCOLOGY PHYSICIAN  Consult performed by: Jeral Pinch, MD  Consult ordered by: Jarvis Morgan, MD  Reason for consult:  Suspected malignant pleural effusion / progression of metastatic breast cancer / ? Need to pleurx catheter placement                                         Oncology Consult Note      Name:  Donna Tyler                                             MRN:  [id number]   Admission Date:  03/18/2018             Impression and Recommendations      # Metastatic ER/PR + Breast Cancer  # Uncontrolled abdominal pain, nausea / vomiting  # Worsening Dyspnea, right sided chest pain    Recommendations:  Ms. Single is now admitted for recurrent symptoms of shortness of breath, nausea, vomiting, abdominal pain with CT imaging findings consistent with progressive abdominopelvic and thoracic metastatic disease, notably with a worsening pleural effusion. Recently admitted 2/17 with similar symptoms, underwent a thoracentesis at the time, cytology of which is pending, as well as a bronch w/ EBUS bx of a 4R lymph node which was consistent with metastatic breast and is ER/PR+. Unfortunately patient has progressed through 2nd line therapy. Per Dr. Tiburcio Pea' note, plans are to send biopsy for NGS and check for NTRK, as well as check for MSI status, and PD-L1 status, which will help guide further therapy. It is unclear, although highly suspected that her pleural effusion is malignant in nature.    If patient has an actionable mutation, she may respond well to therapy we would opt to hold off on placing an pleurx catheter while awaiting both cytology on her pleural effusion and potential therapy to which she may respond. Patient is agreeable with this and states that she is able to tolerate the thoracentesis procedure without difficulty and therefore is agreeable to do this in the future for symptomatic relief. We will have patient follow with Dr. Tiburcio Pea when medically stable. TRANSBRONCHIAL SAMPLING - 3 OR MORE MEDIASTINAL/ HILAR LYMPH NODE STATIONS/ STRUCTURE - FLEXIBLE N/A 03/14/2018    Performed by Lorna Few, MD at [facility] OR/Periop   ??? ABDOMINOPLASTY      Remote history   ??? BIOPSY MASS      Pelvis   ??? BREAST BIOPSY     ??? BREAST SURGERY      Remote history of breast lift   ??? CESAREAN SECTION      X2   ??? MASTECTOMY Right     Axillary lymph node dissection   ??? PARACENTESIS      Multiple   ??? PERITONEAL CATHETER INSERTION      And subsequent removal   ??? SALPINGO-OOPHORECTOMY Bilateral    ??? WRIST SURGERY Right     Cyst removal        SOCIAL HISTORY:  Social History     Socioeconomic History   ??? Marital status: Single     Spouse name: Not on file   ??? Number of children: Not on file   ??? Years of education: Not on file   ??? Highest education level: Not on file  Occupational History   ??? Not on file   Tobacco Use   ??? Smoking status: Former Smoker     Packs/day: 0.50     Years: 30.00     Pack years: 15.00     Types: Cigarettes   ??? Smokeless tobacco: Never Used   ??? Tobacco comment: Quit in 2015 after 30+ years   Substance and Sexual Activity   ??? Alcohol use: Not Currently     Frequency: Never   ??? Drug use: No   ??? Sexual activity: Not on file   Other Topics Concern   ??? 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 Cancer Neg Hx         Denies any family history of breast, ovarian, pancreas cancer or melanoma or other cancer        IMMUNIZATIONS:   There is no immunization history on file for this patient.        ALLERGIES:  Patient has no known allergies.    HOME MEDICATIONS:  Medications Prior to Admission   Medication Sig   ??? calcium carbonate/vitamin D-3 (OS-CAL 500+D) 1250 mg/200 unit tablet Take one tablet by mouth twice daily with meals. Calcium Carb 1250mg  delivers 500mg  elemental Ca   ??? diphenhydramine HCl (BENADRYL ALLERGY PO) Take 50 mg by mouth at bedtime daily.   ??? docusate (COLACE) 100 mg capsule Take 200 mg by mouth daily.   ??? ergocalciferol (VITAMIN D-2) 50,000 unit capsule Take one capsule by mouth every 7 days. (Patient taking differently: Take 1 capsule by mouth as Needed.)   ??? everolimus (AFINITOR) 10 mg tablet Take one tablet by mouth daily.   ??? exemestane (AROMASIN) 25 mg tab Take one tablet by mouth daily. Take after a meal.   ??? morphine IR (MS-IR) 30 mg tablet Take 1-1.5 tablets by mouth every 3 hours as needed Indications: pain   ??? morphine SR (MS CONTIN) 15 mg tablet Take one tablet by mouth every 8 hours  Please take with Morphine SR 60 mg, for a total of Morphine SR 75 mg every 8 hours.   ??? morphine SR (MS CONTIN) 60 mg ER tablet Take one tablet by mouth every 8 hours   ??? ondansetron (ZOFRAN) 8 mg tablet Take one tablet by mouth every 8 hours as needed for Nausea or Vomiting.   ??? pantoprazole DR (PROTONIX) 40 mg tablet TAKE 1 TABLET BY MOUTH EVERY DAY AS NEEDED   ??? polyethylene glycol 3350 (MIRALAX) 17 g packet Take one packet by mouth three times daily as needed for narcotic-induced constipation   ??? prochlorperazine maleate (COMPAZINE) 10 mg tablet Take one tablet by mouth every 6 hours as needed for Nausea or Vomiting.   ??? sertraline (ZOLOFT) 100 mg tablet Take one tablet by mouth daily. (Patient taking differently: Take 100 mg by mouth twice daily.)   ??? triamcinolone acetonide (KENALOG) 0.025 % topical cream Apply  topically to affected area twice daily. (Patient taking differently: Apply  topically to affected area twice daily as needed.)        ROS:  ??? As per HPI.      PHYSICAL EXAM:  Vital Signs: Last Filed In 24 Hours   BP: 115/76 (02/23  1124)  Temp: 36.6 ???C (97.8 ???F) (02/23 1124)  Pulse: 98 (02/23 1124)  Respirations: 16 PER MINUTE (02/23 1124)  SpO2: 95 % (02/23 1124)  SpO2 Pulse: 100 (02/22 1430)

## 2018-03-19 NOTE — Consults
PALLIATIVE CARE INPATIENT CONSULT NOTE     Date of Service: 03/18/18  Date of Admission: 03/18/2018 LOS: 0   Reason for Consult: Pain and Symptom Management    ASSESSMENT/PLAN     Donna Tyler is a 53 y.o. female with a history of metastatic breast cancer ER+/PR+ HER2- which has progressed through 2nd line therapy. She was recently admitted with nausea, vomiting, abdominal pain and shortness of breath and had thoracentesis 2/17 with cytology pending and bronch 2/18 with biopsy of lymph node which was c/w her known breast cancer. She is readmitted within 24 hours of discharge with progressive disease, similar symptoms of nausea, vomiting and shortness of breath and imaging shows worsening pleural effusion    The oncology team is awaiting tumor markers to be able to assess for possible therapeutic targets, and recommends intermittent thoracentesis rather than pleurx catheter while disease-directed treatment is still under consideration.     Physical Symptoms: from Metastatic breast cancer.   Pain  Home pain regimen MS Contin 75 mg q8 hours and MS IR 30-45 mg q3 prn  She has not used any breakthrogh MSIR in the past 2 days, but she used morphine 4 mg iv x 3 likely due to her nausea  Will continue to monitor  Nausea  Nausea/vomiting: home regimen is ondansetron 8 mg po q8 prn  At present she is ordered for compazine, which she would like to try  Shortness of Breath:  Pulmonary service consulted for bedside thoracentesis    Psychosocial Issues:  Lives alone, close to her mother and daughter    Spiritual Issues:  Religion is listed as refused but she had spoken to the chaplains on her last visit    PRACTICAL & DISPOSITION PLANNING     Practical Issues:  Lives alone and has been independent. Lives in an apartment, so mobility at home is likely not an issue    Contacts:  Name Relation Home Work Mobile   McDonald,Jayne Mother [phone]     Sande Rives Daughter   [phone]     Current PPS%:  40 Baseline PPS% (2 weeks prior to admit):  80    Advanced Care Planning: No advance directives on file, declined to complete one when offered on her last admission  Code Status: DNAR-Full Intervention  Goals: Full Treatment    Prognosis (Estimated):  Based on current function, current medical issues, goals of care, and prognosis models, my estimated prognosis is Unknown.    Disposition: Too soon to determine    SUBJECTIVE     History of Present Illness: Donna Tyler is a 53 y.o. female with a history of metastatic breast cancer ER+/PR+ HER2- which has progressed through 2nd line therapy. She was recently admitted with nausea, vomiting, abdominal pain and shortness of breath and had thoracentesis 2/17 with cytology pending and bronch 2/18 with biopsy of lymph node which was c/w her known breast cancer. She is readmitted with progressive disease, similar symptoms of nausea, vomiting and shortness of breath and imaging shows worsening pleural effusion    Medical History:   Diagnosis Date   ??? Chronic pain    ??? Constipation    ??? Depression    ??? Gravida 2 para 2    ??? History of pathologic fracture    ??? Metastatic breast cancer (HCC)    ??? SBP (spontaneous bacterial peritonitis) (HCC)     MSSA, August 2018     Surgical History:   Procedure Laterality Date   ??? BRONCHOSCOPY  DIAGNOSTIC WITH CELL WASHING - FLEXIBLE N/A 03/14/2018    Performed by Lorna Few, MD at [facility]   ??? BRONCHOSCOPY WITH TRANSBRONCHIAL NEEDLE ASPIRATION AND BIOPSY TRACHEA/ MAIN STEM/ LOBAR BRONCHUS - FLEXIBLE N/A 03/14/2018    Performed by Lorna Few, MD at [facility]   ??? BRONCHOSCOPY WITH ENDOBRONCHIAL ULTRASOUND GUIDED TRANSTRACHEAL/ TRANSBRONCHIAL SAMPLING - 3 OR MORE MEDIASTINAL/ HILAR LYMPH NODE STATIONS/ STRUCTURE - FLEXIBLE N/A 03/14/2018    Performed by Lorna Few, MD at [facility]   ??? ABDOMINOPLASTY      Remote history   ??? BIOPSY MASS      Pelvis   ??? BREAST BIOPSY     ??? BREAST SURGERY      Remote history of breast lift acetaminophen, 650 mg, Oral, Q6H PRN  diphenhydrAMINE, 25-50 mg, Oral, Q6H PRN  morphine  injection syringe, 4-6 mg, Intravenous, Q4H PRN  morphine IR, 30-45 mg, Oral, Q3H PRN  prochlorperazine, 10 mg, Intravenous, Q6H PRN         Allergies: Patient has no known allergies.    Physical Exam  Blood pressure 135/79, pulse 97, temperature 36.6 ???C (97.9 ???F), SpO2 100 %.  General: no acute distress, verbal, no grimace, well-developed  Eyes: EOMI, eyelids open, pupils round, no discharge, no icterus  ENT/Throat: slight dry lips, oral mucosa and gums intact and pink, normal dentition, no signs of thrush, normal hearing  Heart: regular rate and rhthym, best heard over the left mid-chest, no murmurs/rubs/gallops, no edema, no ICD/PPM, no port   Lungs: no secretions, no acc muscle use, no intercostal retractions, no apnea, clear to auscultation bilaterally, no W/R/R  Abdomen: non distended, soft, no masses, no ventral hernia, nontender, normal bowel sounds  Musculoskeletal: moves all extremities with normal range of motion, no contractures, normal gait, normal station, normal strength, normal tone throughout  Neuro: no seizures, no myoclonus, no CN neuro deficits, normal sensation  Psych: calm, interactive, normal memory and judgement, oriented to time, person and place, intact ST and LT memory, normal mood and affect  Pulses: 2+ bilateral radial  Skin: warm, dry, no lesions, no rashes, no ulcers, no SQ nodules, no induration, no tenderness  Lymph: no LAD in the neck or axilla  GU: deferred    Lab Review & Radiology & Diagnostic Results  Pertinent labs reviewed and CT Chest/Abdomen/Pelvis 2/22 shows no PE, scattered ground glass opacities, + nodal and osseous metastases, Moderate R and partially loculated L pleueral effusions with near complete collapse of RLL, RLW mass, retroperitoneal LAD and adrenal metastases, mild loculated ascites and 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

## 2018-03-19 NOTE — Progress Notes
-Status post right-sided thoracentesis by pulmonary on 03/13/2018.  Cytology negative for malignancy.  -Bronchoscopy 2/18 showed mediastinal adenopathy.  Lymph node biopsy positive for metastatic breast cancer.  -Reported worsening dyspnea since discharge on 2/21  -Placed on 2 L in the ED but no documentation of desaturations.  -CTA chest on admission showed no acute pulmonary embolism, slight increase in scattered patchy groundglass opacities throughout both lungs, no significant change in mediastinal/left internal mammary nodal and osseous metastases; and unchanged moderate right and partially loculated left pleural effusions with adjacent atelectasis with near complete collapse of the right lower lobe.  -EKG showed sinus tachycardia and prolonged QTC of 528 but no acute ischemic changes   -Troponin negative 2 and BNP was 129 at admission  -Status post right-sided thoracentesis with 800 ml removal 2/22  -Blood cultures 2/22 are negative  -Procalcitonin normal.  -ECHO ordered  -May need consideration for Pleurx tube but patient wants oncology opinion prior to considering.    Lower extremity edema likely due to poor nutritional status.  -Bilateral lower extremity Dopplers 2/22 showed bilateral subcutaneous calf edema and small right Baker's cyst but no DVT    SIRS.  Resolved.  -Presented with 2/4 SIRS with tachycardia and tachypnea.    -UA positive for 2-10 WBCs, 2-10 RBCs, and 0-2 SE cells  -See management of abdominal and pulmonary issues above    Aion gap metabolic acidosis likely due to volume depletion.  -Presented with anion gap 18  -Resolved with IV fluids.  ???  Metastatic breast cancer.  -Follows with Dr. Tiburcio Pea of oncology.  -Diagnosed with right breast biopsy in December 2015.   -Status post right mastectomy with axillary lymph node dissection February 2016.  Pathology showed invasive ductal carcinoma, grade 2  With 10-15 lymph nodes with 2 mm extracapsular extension. ER positive, PR positive, morphine SR (MS CONTIN) tablet 75 mg, 75 mg, Oral, Q8H*  pantoprazole (PROTONIX) injection 40 mg, 40 mg, Intravenous, QDAY  polyethylene glycol 3350 (MIRALAX) packet 17 g, 1 packet, Oral, TID  potassium, sodium phosphates (PHOS-NaK) packet 500 mg, 2 packet, Oral, ONCE  sertraline (ZOLOFT) tablet 100 mg, 100 mg, Oral, BID    Continuous Infusions:  ??? dextrose  5 % & 0.45% NaCl infusion       PRN and Respiratory Meds:acetaminophen Q6H PRN, diphenhydrAMINE Q6H PRN, morphine  injection syringe Q4H PRN, morphine IR Q3H PRN, prochlorperazine Q6H PRN       Physical Exam:  Vital Signs: Last Filed In 24 Hours Vital Signs: 24 Hour Range   BP: 126/78 (02/23 0728)  Temp: 36.8 ???C (98.2 ???F) (02/23 8119)  Pulse: 87 (02/23 0728)  Respirations: 18 PER MINUTE (02/23 0728)  SpO2: 98 % (02/23 0728)  SpO2 Pulse: 100 (02/22 1430) BP: (92-155)/(69-92)   Temp:  [36.4 ???C (97.5 ???F)-36.9 ???C (98.4 ???F)]   Pulse:  [83-104]   Respirations:  [11 PER MINUTE-26 PER MINUTE]   SpO2:  [92 %-100 %]    Intensity Pain Scale (Self Report): 4 (03/18/18 1508)    Intake/Outpur Summary: (Last 24 hours)    Intake/Output Summary (Last 24 hours) at 03/19/2018 0746  Last data filed at 03/19/2018 0729  Gross per 24 hour   Intake 680 ml   Output 0 ml   Net 680 ml     Physical Exam:    General: alert and oriented to person, place, and time.  No acute distress.  Heart: regular rhythm.  Normal rate.  Normal S1 and S2.  No mumrurs, rubs  or gallops.  Lungs: clear to ascultation.  No wheezes, rales or coarseness.  Abdomen: Normal bowel sounds.  Soft and not distended.  Non tender to palpation.  Extremities: No clubbing, cyanosis, or edema.  Distal pulses intact.     Telemetry: NSR 60-70    Laboratory:  Recent Labs     03/18/18  0841 03/18/18  1853 03/19/18  0115 03/19/18  0410   NA 141 140 140 142   K 3.2* 3.3* 3.6 3.6   CL 102 106 107 107   CO2 21 17* 16* 20*   BUN 6* 5* 4* 4*   CR 0.62 0.54 0.48 0.53   GLU 102* 80 112* 77   GAP 18* 17* 17* 15*   GFR >60 >60 >60 >60

## 2018-03-20 ENCOUNTER — Encounter: Admit: 2018-03-20 | Discharge: 2018-03-20 | Payer: Commercial Managed Care - HMO

## 2018-03-20 LAB — MAGNESIUM: Lab: 1.7 mg/dL — ABNORMAL LOW (ref 1.6–2.6)

## 2018-03-20 LAB — CBC AND DIFF: Lab: 4.8 K/UL — ABNORMAL HIGH (ref >60)

## 2018-03-20 LAB — PHOSPHORUS: Lab: 1.7 mg/dL — ABNORMAL LOW (ref >60)

## 2018-03-20 LAB — COMPREHENSIVE METABOLIC PANEL: Lab: 138 MMOL/L — ABNORMAL LOW (ref >60)

## 2018-03-20 MED ORDER — MORPHINE 2 MG/ML IV CRTG
2-4 mg | INTRAVENOUS | 0 refills | Status: DC | PRN
Start: 2018-03-20 — End: 2018-03-23

## 2018-03-20 MED ORDER — FLU VACC QS2019-20 6MOS UP(PF) 60 MCG (15 MCG X 4)/0.5 ML IM SYRG
.5 mL | Freq: Once | INTRAMUSCULAR | 0 refills | Status: CP
Start: 2018-03-20 — End: ?
  Administered 2018-03-23: 18:00:00 0.5 mL via INTRAMUSCULAR

## 2018-03-20 MED ORDER — POTASSIUM CHLORIDE 20 MEQ PO TBTQ
40 meq | Freq: Once | ORAL | 0 refills | Status: CP
Start: 2018-03-20 — End: ?
  Administered 2018-03-20: 15:00:00 40 meq via ORAL

## 2018-03-20 MED ORDER — PROCHLORPERAZINE EDISYLATE 5 MG/ML IJ SOLN
10 mg | INTRAVENOUS | 0 refills | Status: DC | PRN
Start: 2018-03-20 — End: 2018-03-23

## 2018-03-20 MED ORDER — POTASSIUM CHLORIDE IN WATER 10 MEQ/50 ML IV PGBK
10 meq | INTRAVENOUS | 0 refills | Status: DC
Start: 2018-03-20 — End: 2018-03-20

## 2018-03-20 MED ORDER — PROCHLORPERAZINE MALEATE 5 MG PO TAB
10 mg | ORAL | 0 refills | Status: DC | PRN
Start: 2018-03-20 — End: 2018-03-23
  Administered 2018-03-21 (×2): 10 mg via ORAL

## 2018-03-20 MED ORDER — PERFLUTREN LIPID MICROSPHERES 1.1 MG/ML IV SUSP
1-20 mL | Freq: Once | INTRAVENOUS | 0 refills | Status: CP | PRN
Start: 2018-03-20 — End: ?
  Administered 2018-03-20: 14:00:00 3 mL via INTRAVENOUS

## 2018-03-20 MED ORDER — POTASSIUM, SODIUM PHOSPHATES 280-160-250 MG PO PWPK
2 | Freq: Once | ORAL | 0 refills | Status: CP
Start: 2018-03-20 — End: ?
  Administered 2018-03-20: 21:00:00 500 mg via ORAL

## 2018-03-20 MED ORDER — UMECLIDINIUM-VILANTEROL 62.5-25 MCG/ACTUATION IN DSDV
1 | Freq: Every day | RESPIRATORY_TRACT | 0 refills | Status: DC
Start: 2018-03-20 — End: 2018-03-23
  Administered 2018-03-20: 16:00:00 1 via RESPIRATORY_TRACT

## 2018-03-20 NOTE — Progress Notes
Pulmonary - Progress Note      Name:  Jahara Dail                                             MRN:  [id number]   Admission Date:  03/18/2018                     Assessment/Plan:    53 year old female with PMH as below who presents with shortness of breath    Problem List  #R sided Pleural Effusion - s/p thoracentesis - final cytology pending  #Hilar and Mediastinal Adenopathy - 4R positive for metastatic breast cancer  #Nausea/Vomiting    Discussion  Patient s/p thoracentesis with improvement in dyspnea, final cytology pending. Oncology preferring to hold off on PleurX at this time given increased risk for infection and expected quick response to chemotherapy. Will evaluate final oxygen needs and patient can get outpatient thora with IR as needed. Give previous smoking history and history of airway disease, will trial LABA-LAMA    Recommendations  --initiate LABA-LAMA  --outpatient thora with IR  --follow fluid cytology  --exercise oximetry prior to discharge    Patient seen and discussed with Dr. Rondel Oh.    Lucretia Field MD  Pulmonary/Critical Care Fellow  [phone]    __________________________________________________________________________________    Subjective:  No acute events overnight - shortness of breath improved after thoracentesis. Awaiting final discharge plans      Review of Systems:  Positive symptoms in bold  CONSTITUTIONAL: fatigue, fevers, night sweats, weight loss  HEENT: epistaxis, rhinorrhea, changes in vision, double vision, blurry vision  CV: palpitations, chest pain, LE edema  RESP: shortness of breath, wheezing, cough, productive sputum  GI: nausea, vomiting, abdominal pain, diarrhea, melena, hematochezia  GU: dysuria, hematuria, frequency, urgency, flank pain  HEME: easy bleeding, bruising, gingival bleeding, lymphadenopathy  MUSK: thoracic pain, lumbar pain, cervical pain, arthralgia  NEURO: headache, weakness, gait instability

## 2018-03-21 ENCOUNTER — Inpatient Hospital Stay: Admit: 2018-03-21 | Discharge: 2018-03-21 | Payer: Commercial Managed Care - HMO

## 2018-03-21 ENCOUNTER — Encounter: Admit: 2018-03-21 | Discharge: 2018-03-21 | Payer: Commercial Managed Care - HMO

## 2018-03-21 LAB — CELL COUNT W/DIFF-FLUIDS
Lab: 460 /uL
Lab: 72 %
Lab: 9 %

## 2018-03-21 LAB — COMPREHENSIVE METABOLIC PANEL: Lab: 138 MMOL/L — ABNORMAL LOW (ref 137–147)

## 2018-03-21 LAB — MAGNESIUM: Lab: 1.7 mg/dL — ABNORMAL HIGH (ref >60)

## 2018-03-21 LAB — CBC AND DIFF: Lab: 6 K/UL — ABNORMAL HIGH (ref >60)

## 2018-03-21 LAB — PHOSPHORUS: Lab: 2 mg/dL — ABNORMAL LOW (ref >60)

## 2018-03-21 MED ORDER — POTASSIUM CHLORIDE 20 MEQ PO TBTQ
40 meq | Freq: Once | ORAL | 0 refills | Status: CP
Start: 2018-03-21 — End: ?
  Administered 2018-03-21: 15:00:00 40 meq via ORAL

## 2018-03-21 MED ORDER — SENNOSIDES-DOCUSATE SODIUM 8.6-50 MG PO TAB
1 | Freq: Two times a day (BID) | ORAL | 0 refills | Status: DC
Start: 2018-03-21 — End: 2018-03-23
  Administered 2018-03-22 – 2018-03-23 (×2): 1 via ORAL

## 2018-03-21 MED ORDER — PANTOPRAZOLE 40 MG PO TBEC
40 mg | Freq: Every day | ORAL | 0 refills | Status: DC
Start: 2018-03-21 — End: 2018-03-23
  Administered 2018-03-22 – 2018-03-23 (×2): 40 mg via ORAL

## 2018-03-21 MED ORDER — PATCH DOCUMENTATION - FENTANYL 75 MCG/HR
Freq: Two times a day (BID) | TRANSDERMAL | 0 refills | Status: DC
Start: 2018-03-21 — End: 2018-03-23

## 2018-03-21 MED ORDER — FENTANYL 75 MCG/HR TD PT72
1 | TRANSDERMAL | 0 refills | Status: DC
Start: 2018-03-21 — End: 2018-03-23
  Administered 2018-03-22 – 2018-03-23 (×2): 1 via TRANSDERMAL

## 2018-03-21 NOTE — Progress Notes
Palliative Care  Full note to follow.     Rotating to Fentanyl patch.  q 48 hrs (not 72) and use occlusive transparent film dressing to ensure in place.  Overlap with MSContin x1 dose.  Use PRN breakthrough meds for persisting pain.  Will need NCM to check on prior auth before discharge.  Fan to room (RN following up).

## 2018-03-22 ENCOUNTER — Encounter: Admit: 2018-03-22 | Discharge: 2018-03-22 | Payer: Commercial Managed Care - HMO

## 2018-03-22 LAB — CBC AND DIFF: Lab: 5.5 K/UL — ABNORMAL LOW (ref >60)

## 2018-03-22 LAB — COMPREHENSIVE METABOLIC PANEL: Lab: 143 MMOL/L — ABNORMAL LOW (ref >60)

## 2018-03-22 LAB — MAGNESIUM: Lab: 1.7 mg/dL — ABNORMAL LOW (ref >60)

## 2018-03-22 LAB — PHOSPHORUS: Lab: 2.3 mg/dL — ABNORMAL LOW (ref >60)

## 2018-03-22 MED ORDER — MORPHINE 15 MG PO TAB
15-45 mg | ORAL | 0 refills | Status: DC | PRN
Start: 2018-03-22 — End: 2018-03-23
  Administered 2018-03-22 – 2018-03-23 (×3): 30 mg via ORAL

## 2018-03-22 MED ORDER — SERTRALINE 100 MG PO TAB
100 mg | Freq: Two times a day (BID) | ORAL | 0 refills | Status: CN
Start: 2018-03-22 — End: ?

## 2018-03-22 NOTE — Case Management (ED)
In-Network Pharmacy Benefits Summary for [employer]    Requested by: Maxine Glenn White/Erin Shiloh  Medication(s) Checked: Anoro Ellipita and Fentanyl Patches.  Prior Auth Required: YES - the Fentanyl    Medication 30-day Retail Pharmacy Supply 90-day Supply   Anoro Ellipita Preferred Brand tier 2 - $35 copay $88 co-pay   Fentanyl Patches Generic tier 1 - $10 copay  REQUIRES [place] $25co-pay     Updated RNCM.      Geanie Cooley  Case Management Assistant  For additional assistance, please contact Dyanne Carrel [phone]

## 2018-03-22 NOTE — Progress Notes
CLINICAL NUTRITION                                                        Clinical Nutrition Initial Assessment    Name: Donna Tyler            MRN: [id number]                DOB: 1965/09/10          Age: 53 y.o.  Admission Date: 03/18/2018             LOS: 4 days        Recommendation:    ??? Continue regular diet as tolerated with boost plus to supplement.    Comments:  53 yo F with PMH of metastatic breast cancer, chronic pain due to malignancy, constipation, and history of SBP admitted from ED 2/22 with uncontrolled dyspnea, abdominal pain,???nausea, and vomiting. Pt reports appetite is fair, she is eating small amounts of soft foods (meatloaf, omelet, etc) and supplementing with boost plus. Her current weight of 147 lb is within usual body weight range of 145-155 lb which she's been relatively stable over the last year. Encouraged pt to focus on small/frequent meals as tolerated, focusing on high calorie/high protein foods with boost to supplement.    Nutrition Assessment of Patient:  Admit Weight: 67 kg(bed scale);  ;    BMI (Calculated): 25.34; BMI Categories Adult: Over Weight: 25-29.9(25.34);      Pertinent Allergies/Intolerances: none  Pertinent Labs: reviewed; Pertinent Meds: reviewed;    Oral Diet Order: Regular; Oral Supplement: Boost plus;QID     Current Oral Intake: Marginally Adequate                   Malnutrition Assessment:   Does not meet criteria;  ;  ;  ;  ;         Nutrition Focused Physical Assessment:    ;  ;     ;  ;    Edema: Yes; Severity: Mild; Location: Lower extremities     Pressure Injury: none          Nutrition Diagnosis:  Inadequate protein-energy intake  Etiology: reduced appetite, nausea, constipation  Signs & Symptoms: pt report, diet hx                      Intervention / Plan:  Boost plus ordered QID.  Monitor PO intakes, weight trends, labs, meds, GI health.       Goals:  Patient to consume >75% of meals/supplements  Time Frame: Throughout stay    Catie Eston Mould, MS, RD, LD

## 2018-03-22 NOTE — Progress Notes
Labs obtained and labeled at bedside.

## 2018-03-23 ENCOUNTER — Encounter: Admit: 2018-03-23 | Discharge: 2018-03-23 | Payer: Commercial Managed Care - HMO

## 2018-03-23 ENCOUNTER — Inpatient Hospital Stay: Admit: 2018-03-18 | Discharge: 2018-03-18 | Payer: Commercial Managed Care - HMO

## 2018-03-23 ENCOUNTER — Inpatient Hospital Stay
Admit: 2018-03-18 | Discharge: 2018-03-23 | Disposition: A | Discharge status: Home / Self Care | Payer: Commercial Managed Care - HMO

## 2018-03-23 ENCOUNTER — Inpatient Hospital Stay: Admit: 2018-03-20 | Discharge: 2018-03-20 | Payer: Commercial Managed Care - HMO

## 2018-03-23 DIAGNOSIS — K5903 Drug induced constipation: ICD-10-CM

## 2018-03-23 DIAGNOSIS — R180 Malignant ascites: ICD-10-CM

## 2018-03-23 DIAGNOSIS — Z923 Personal history of irradiation: ICD-10-CM

## 2018-03-23 DIAGNOSIS — F329 Major depressive disorder, single episode, unspecified: ICD-10-CM

## 2018-03-23 DIAGNOSIS — E876 Hypokalemia: ICD-10-CM

## 2018-03-23 DIAGNOSIS — Z515 Encounter for palliative care: ICD-10-CM

## 2018-03-23 DIAGNOSIS — G893 Neoplasm related pain (acute) (chronic): Principal | ICD-10-CM

## 2018-03-23 DIAGNOSIS — M7121 Synovial cyst of popliteal space [Baker], right knee: ICD-10-CM

## 2018-03-23 DIAGNOSIS — E559 Vitamin D deficiency, unspecified: ICD-10-CM

## 2018-03-23 DIAGNOSIS — C787 Secondary malignant neoplasm of liver and intrahepatic bile duct: ICD-10-CM

## 2018-03-23 DIAGNOSIS — N83209 Unspecified ovarian cyst, unspecified side: ICD-10-CM

## 2018-03-23 DIAGNOSIS — Z87311 Personal history of (healed) other pathological fracture: ICD-10-CM

## 2018-03-23 DIAGNOSIS — D630 Anemia in neoplastic disease: ICD-10-CM

## 2018-03-23 DIAGNOSIS — R6510 Systemic inflammatory response syndrome (SIRS) of non-infectious origin without acute organ dysfunction: ICD-10-CM

## 2018-03-23 DIAGNOSIS — E46 Unspecified protein-calorie malnutrition: ICD-10-CM

## 2018-03-23 DIAGNOSIS — E869 Volume depletion, unspecified: ICD-10-CM

## 2018-03-23 DIAGNOSIS — T40605A Adverse effect of unspecified narcotics, initial encounter: ICD-10-CM

## 2018-03-23 DIAGNOSIS — Z170 Estrogen receptor positive status [ER+]: ICD-10-CM

## 2018-03-23 DIAGNOSIS — I5032 Chronic diastolic (congestive) heart failure: ICD-10-CM

## 2018-03-23 DIAGNOSIS — Z79810 Long term (current) use of selective estrogen receptor modulators (SERMs): ICD-10-CM

## 2018-03-23 DIAGNOSIS — Z853 Personal history of malignant neoplasm of breast: ICD-10-CM

## 2018-03-23 DIAGNOSIS — Z9011 Acquired absence of right breast and nipple: ICD-10-CM

## 2018-03-23 DIAGNOSIS — E872 Acidosis: ICD-10-CM

## 2018-03-23 DIAGNOSIS — C7800 Secondary malignant neoplasm of unspecified lung: ICD-10-CM

## 2018-03-23 DIAGNOSIS — K219 Gastro-esophageal reflux disease without esophagitis: ICD-10-CM

## 2018-03-23 DIAGNOSIS — Z87891 Personal history of nicotine dependence: ICD-10-CM

## 2018-03-23 DIAGNOSIS — C7951 Secondary malignant neoplasm of bone: ICD-10-CM

## 2018-03-23 DIAGNOSIS — Z79899 Other long term (current) drug therapy: ICD-10-CM

## 2018-03-23 DIAGNOSIS — Z66 Do not resuscitate: ICD-10-CM

## 2018-03-23 DIAGNOSIS — J910 Malignant pleural effusion: ICD-10-CM

## 2018-03-23 DIAGNOSIS — C779 Secondary and unspecified malignant neoplasm of lymph node, unspecified: ICD-10-CM

## 2018-03-23 LAB — PHOSPHORUS: Lab: 2 mg/dL — ABNORMAL LOW (ref 2.0–4.5)

## 2018-03-23 LAB — CBC AND DIFF: Lab: 6.3 K/UL — ABNORMAL LOW (ref 4.5–11.0)

## 2018-03-23 LAB — MAGNESIUM: Lab: 1.7 mg/dL — ABNORMAL HIGH (ref >60)

## 2018-03-23 LAB — COMPREHENSIVE METABOLIC PANEL: Lab: 141 MMOL/L — ABNORMAL LOW (ref >60)

## 2018-03-23 MED ORDER — SERTRALINE 100 MG PO TAB
100 mg | ORAL_TABLET | Freq: Two times a day (BID) | ORAL | 3 refills | Status: AC
Start: 2018-03-23 — End: ?
  Filled 2018-03-23 (×2): qty 60, 30d supply, fill #1

## 2018-03-23 MED ORDER — UMECLIDINIUM-VILANTEROL 62.5-25 MCG/ACTUATION IN DSDV
1 | Freq: Every day | RESPIRATORY_TRACT | 3 refills | Status: AC
Start: 2018-03-23 — End: 2018-04-14
  Filled 2018-03-23 (×2): qty 60, 30d supply, fill #1

## 2018-03-23 MED ORDER — MORPHINE 30 MG PO TAB
30-45 mg | ORAL_TABLET | ORAL | 0 refills | 7.00000 days | Status: AC | PRN
Start: 2018-03-23 — End: 2018-04-04
  Filled 2018-03-23 (×2): qty 240, 20d supply, fill #1

## 2018-03-23 MED ORDER — SENNOSIDES-DOCUSATE SODIUM 8.6-50 MG PO TAB
1 | Freq: Two times a day (BID) | ORAL | 0 refills | Status: AC
Start: 2018-03-23 — End: ?

## 2018-03-23 MED ORDER — FENTANYL 75 MCG/HR TD PT72
1 | MEDICATED_PATCH | TRANSDERMAL | 0 refills | Status: AC
Start: 2018-03-23 — End: 2018-03-30
  Filled 2018-03-23 (×2): qty 15, 30d supply, fill #1

## 2018-03-23 MED ORDER — PROCHLORPERAZINE MALEATE 10 MG PO TAB
10 mg | ORAL_TABLET | ORAL | 3 refills | Status: AC | PRN
Start: 2018-03-23 — End: 2018-04-05
  Filled 2018-03-23 (×2): qty 60, 15d supply, fill #1

## 2018-03-23 NOTE — Case Management (ED)
[place], [place] [address] [phone]      [facility]      No service has been selected for the patient.      [facility]      No service has been selected for the patient.      [facility]      No service has been selected for the patient.      Arbutus Leas, RN, Air traffic controller cell(not for patient use):[phone]  Office hours: M-F 8-4

## 2018-03-23 NOTE — Discharge Instructions - Pharmacy
??? Protein,UA 2+ (A) NEG-NEG   ??? Glucose,UA NEG NEG-NEG   ??? Ketones,UA 2+ (A) NEG-NEG   ??? Bilirubin,UA NEG NEG-NEG   ??? Blood,UA 1+ (A) NEG-NEG   ??? Urobilinogen,UA NORMAL NORM-NORMAL   ??? Nitrite,UA NEG NEG-NEG   ??? Leukocytes,UA NEG NEG-NEG   ??? Urine Ascorbic Acid, UA NEG NEG-NEG   URINALYSIS, MICROSCOPIC   ??? Collection Time: 03/18/18 10:00 AM   Result Value Ref Range   ??? WBCs,UA 2-10 0 - 2 /HPF   ??? RBCs,UA 2-10 0 - 3 /HPF   ??? MucousUA TRACE ???   ??? Squamous Epithelial Cells 0-2 0 - 5   ???  Glucose: (!) 102 (03/18/18 0841)  Pertinent radiology reviewed.    Brief Hospital Course:  The patient was admitted and the following issues were addressed during this hospitalization:     1. Uncontrolled???abdominal???pain, nausea, and vomiting likely due to malignancy in setting of chronic pain due to malignancy and constipation.  Palliative care consulted.  -Admited 2/16-2/21 with similar symptoms.  Palliative care consulted to help with symptom management.  Discharged with MS Contin 75 mg every 8 hours, MS IR 30-45 mg every 3 hours prn  -CT abdomen pelvis at admission showed slight increase in size of the complex cystic and solid right lower quadrant mass since February 16, 2018 which may represent an ovarian metastasis or primary ovarian neoplasm; slight increase in size of contour metastatic periportal and retroperitoneal lymph nodes and adrenal metastases since January 2020; encasement and severe narrowing of the suprahepatic IVC; ncrease in size of an ill-defined hypoenhancing mass in the right inferior hepatic lobe which may represent a hepatic metastasis or direct infiltration of retroperitoneal metastatic disease; ill-defined linear hypodensities in this area that may represents bile duct dilatation or areas of peripheral thrombus; persistent mild loculated ascites and areas of peritoneal thickening and nodularity suggestive of peritoneal metastatic disease; and no significant change in osseous metastatic disease. -Initially treated with AC followed by Taxol and tamoxifen.  Status post right chest wall radiation completed in December 2016  -Reports history of bilateral salpingo-oophorectomy but date unclear  -Developed recurrence in June 2018 when she presented with abdominal distention and ascites and was found to have metastatic disease with bone, lymph nodes, pleural space, liver, kidneys, ascites, and pelvic lesions.  -Diagnostic paracentesis June 2018 was negative for malignancy  -Pelvic mass biopsy June 2018 consistent with known breast cancer.  Cytology from ascites were negative for malignancy.  Tumor cells were HER-2 negative, ER positive, and PR positive.  -Started on palbociclib (was on falsodex until approved for palbociclib) and denosunab every 3 months in July 2018.  Palbociclib was decreased due to his cytopenias.  -Did require a peritoneal drain for malignant ascites that was subsequently removed when ascites resolved.  -Completed radiation to T8 and bilateral femur lesion in July 2018  -Found to have progression of disease based on increasing Ca 27.29 and changed to exemestane and everolimus Oct 2019, which was stopped Jan 202 due to progression of disease based on increasing CA 27.29 levels.  -Diagnostic thoracentesis 2/17 showed cytology consistent with metastatic adenocarcinoma.  -Bronchoscopy for lymph node biopsy 2/18 showed pathology consistent with metastatic breast cancer. Additional stains for NGS, NTRK, MSI, and PD-L1 to help guide further therapy.  Per oncology, results should be available by the end of the week.  -Diagnostic thoracentesis 2/22 showed cytology consistent with metastatic adenocarcinoma  -Per oncology, if patient is positive for any of these genetic markers, she should respond  well to therapy and they recommended scheduling outpatient thoracentesis and waiting on Pleurx catheter until the studies have returned.  ???  7. Hypokalemia and hypophosphatemia. -Presented with potassium 3.2 and phosphorus 1.1.  -EKG at admission showed prolong QTc but improved to 468 with electrolyte replacement.  -Resolved with replacement.  ???  8.  Depression. Continue Zoloft 100 mg BID.  ???  9. Vitamin D deficiency.  -25 [facility] January 2020 was 16.5  -Continue 50,000 units once a week for 12 weeks (2/24)  ???  10. Malnutrition.  -I am concerned that patient is not eating well given her persistent hypokalemia  -Continue high-protein chocolate boost with meals and bedtime  ???  11. Full code.  Confirm with patient 2/23.  She changed her mind from admission and stated that she wanted all care to save her life.  Glendale Chard is emergency contact: [phone]  ???  12. All other chronic medical issues were stable, so no further changes were made other outpatient regimen.      13. Disposition.    Discharge to home.  ???  Follow-up  -Dr. Waylan Rocher will arrange for outpatient follow-up with palliative care  -She has follow-up with Dr. Tiburcio Pea 3/2.  ???    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  Dyspnea, right-sided chest pain, and cough due to malignant pleural effusion  Chronic diastolic heart failure  Lower extremity edema due to poor nutritional status  Systemic inflammatory response syndrome  Anion gap metabolic acidosis due to volume depletion  Metastatic breast cancer  Hypokalemia  Hypophosphatemia  Prolonged QTC due to electrolyte abnormalities  Depression  Gastroesophageal reflux disease  Vitamin D deficiency  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  These medications need to be filled by your primary care provider (PCP).  Regular refill requests should be directed to your primary care provider (PCP).     Discharging attending physician: Yaiden Yang T [id number]      Regular Diet    You have no dietary restriction. Please continue with a healthy balanced diet.     General Supplement    Supplement: Ensure High Protein  Amount: 1 carton  How often: four times a day     Return Appointment    Keep all your appointment as scheduled above.  Please call Dr. Tiburcio Pea 2/28 to make sure genetic markers are completed in case you need to move your appointment on Monday.    When you develop worsening shortness of breath, call interventional radiology 24-48 hours to scheduled thoracentesis at [phone].    Dr. Waylan Rocher of palliative office will call you with an appointment time.     Opioid (Narcotic) Safety Information    OPIOID (NARCOTIC) PAIN MEDICATION SAFETY    We care about your comfort, and believe you need opioid medications at this time to treat your pain.  An opioid is a strong pain medication.  It is only available by prescription for moderate to severe  pain.  Usually these medications are used for only a short time to treat pain, but sometimes will be prescribed for longer.  Talk with your doctor or nurse about how long they expect you to need this medication.    When used the right way, opioids are safe and effective medications to treat your pain, even when used for a long time.  Yet, when used in the wrong way, opioids can be dangerous for you or others.  Opioids do not work for everyone.  Most patients do not get full relief of their pain from opioid medication; full relief of your pain may not be possible.     For your safety, we ask you to follow these instructions: *Only take your opioid medication as prescribed.  If your pain is not controlled with the prescribed dose, or the medication is not lasting long enough, call your doctor.  *Do not break or crush your opioid medication unless your doctor or pharmacist says you can.  With certain medications, this can be dangerous, and may cause death.  *Never share your medications with others, even if they appear to have a good reason.  Never take someone else's pain medication-this is dangerous, and illegal (a crime).  Overdoses and deaths have occurred.  *Keep your opioid medications safe, as you would with cash, in a lock box or similar container.  *Make sure your opioids are going to be secure, especially if you are around children or teens.  *Talk with your doctor or pharmacist before you take other medications.  *Avoid driving, operating machinery, or drinking alcohol while taking opioid pain medication.  This may be unsafe.    Pain medications can cause constipation. Constipation is bowel movements that are less often than normal. Stools often become very hard and difficult to pass. This may lead to stomach pain and bloating. It may also cause pain when trying to use the bathroom. Constipation may be treated with suppositories, laxatives or stool softeners. A diet high in fiber with plenty of fluids helps to maintain regular, soft bowel movements.      Current Discharge Medication List       START taking these medications    Details   fentaNYL (DURAGESIC) 75 mcg/hr patch Apply one patch to top of skin as directed every 48 hours  Qty: 15 patch, Refills: 0    PRESCRIPTION TYPE:  Normal      senna/docusate (SENOKOT-S) 8.6/50 mg tablet Take one tablet by mouth twice daily.    PRESCRIPTION TYPE:  OTC      umeclidinium-vilanteroL (ANORO ELLIPTA) 62.5-25 mcg/actuation inhaler Inhale one puff by mouth into the lungs daily.  Qty: 60 each, Refills: 3    PRESCRIPTION TYPE:  Normal list in our system        docusate (COLACE) 100 mg capsule        everolimus (AFINITOR) 10 mg tablet        exemestane (AROMASIN) 25 mg tab        morphine SR (MS CONTIN) 15 mg tablet        morphine SR (MS CONTIN) 60 mg ER tablet        ondansetron (ZOFRAN) 8 mg tablet            Scheduled appointments:    Mar 27, 2018  2:10 PM CST  (Arrive by 1:55 PM)  Return Patient with Richardson Chiquito, MD  [facility] Exam) [phone]  [place] [address]  [phone]   Jun 22, 2018  9:00 AM CDT  (Arrive by 8:45 AM)  Genetics New Consult with Salvatore Marvel, MS CGC  [facility] [place] [facility] Exam) [facility]  [address] [place] [address]  [place] [address]  [phone]   Jun 22, 2018 10:00 AM CDT  (Arrive by 9:45 AM)  Lab with LL2 PHLEBOTOMY CHAIR  [facility] [place] [facility]) [facility]  [address]  [place] [address]  [phone]   Jul 20, 2018 10:00 AM CDT  (Arrive by 9:45 AM)  Genetic Follow Up with Salvatore Marvel, MS CGC  [facility]) [facility]  [address] [place] [address]  [phone]          Pending items needing follow up: none    Signed:  Jarvis Morgan, MD  03/23/2018      cc:  Primary Care Physician:  Cecil Cranker   Verified

## 2018-03-23 NOTE — Discharge Planning (AHS/AVS)
[facility] will be providing your oxygen and supplies on the date of discharge to your home.  You may reach them at [phone] if you need to contact them.    Arbutus Leas, RN, Air traffic controller [phone]  Office hours: M-F 8-4

## 2018-03-24 LAB — CULTURE-BLOOD W/SENSITIVITY

## 2018-03-27 ENCOUNTER — Encounter: Admit: 2018-03-27 | Discharge: 2018-03-27 | Payer: Commercial Managed Care - HMO

## 2018-03-30 ENCOUNTER — Encounter: Admit: 2018-03-30 | Discharge: 2018-03-30 | Payer: Commercial Managed Care - HMO

## 2018-03-30 MED ORDER — FENTANYL 50 MCG/HR TD PT72
1 | MEDICATED_PATCH | TRANSDERMAL | 0 refills | Status: AC
Start: 2018-03-30 — End: 2018-03-31

## 2018-03-31 ENCOUNTER — Encounter: Admit: 2018-03-31 | Discharge: 2018-03-31 | Payer: Commercial Managed Care - HMO

## 2018-03-31 MED ORDER — FENTANYL 25 MCG/HR TD PT72
2 | MEDICATED_PATCH | TRANSDERMAL | 0 refills | Status: AC
Start: 2018-03-31 — End: 2018-04-04

## 2018-04-04 ENCOUNTER — Encounter: Admit: 2018-04-04 | Discharge: 2018-04-04 | Payer: Commercial Managed Care - HMO

## 2018-04-04 DIAGNOSIS — C771 Secondary and unspecified malignant neoplasm of intrathoracic lymph nodes: ICD-10-CM

## 2018-04-04 DIAGNOSIS — Z170 Estrogen receptor positive status [ER+]: ICD-10-CM

## 2018-04-04 DIAGNOSIS — Z9011 Acquired absence of right breast and nipple: ICD-10-CM

## 2018-04-04 DIAGNOSIS — C787 Secondary malignant neoplasm of liver and intrahepatic bile duct: ICD-10-CM

## 2018-04-04 DIAGNOSIS — C50919 Malignant neoplasm of unspecified site of unspecified female breast: ICD-10-CM

## 2018-04-04 DIAGNOSIS — K5900 Constipation, unspecified: ICD-10-CM

## 2018-04-04 DIAGNOSIS — G893 Neoplasm related pain (acute) (chronic): ICD-10-CM

## 2018-04-04 DIAGNOSIS — Z87311 Personal history of (healed) other pathological fracture: ICD-10-CM

## 2018-04-04 DIAGNOSIS — C7951 Secondary malignant neoplasm of bone: ICD-10-CM

## 2018-04-04 DIAGNOSIS — C786 Secondary malignant neoplasm of retroperitoneum and peritoneum: ICD-10-CM

## 2018-04-04 DIAGNOSIS — C7971 Secondary malignant neoplasm of right adrenal gland: ICD-10-CM

## 2018-04-04 DIAGNOSIS — K652 Spontaneous bacterial peritonitis: ICD-10-CM

## 2018-04-04 DIAGNOSIS — G8929 Other chronic pain: ICD-10-CM

## 2018-04-04 DIAGNOSIS — C782 Secondary malignant neoplasm of pleura: ICD-10-CM

## 2018-04-04 DIAGNOSIS — F3289 Other specified depressive episodes: ICD-10-CM

## 2018-04-04 DIAGNOSIS — E559 Vitamin D deficiency, unspecified: ICD-10-CM

## 2018-04-04 DIAGNOSIS — F329 Major depressive disorder, single episode, unspecified: ICD-10-CM

## 2018-04-04 DIAGNOSIS — C50911 Malignant neoplasm of unspecified site of right female breast: Principal | ICD-10-CM

## 2018-04-04 DIAGNOSIS — Z789 Other specified health status: ICD-10-CM

## 2018-04-04 DIAGNOSIS — Z87891 Personal history of nicotine dependence: ICD-10-CM

## 2018-04-04 MED ORDER — OXYCODONE-ACETAMINOPHEN 10-325 MG PO TAB
1 | ORAL_TABLET | ORAL | 0 refills | 2.00000 days | Status: AC | PRN
Start: 2018-04-04 — End: 2018-04-05

## 2018-04-04 MED ORDER — MORPHINE 100 MG PO TBER
100 mg | ORAL_TABLET | Freq: Two times a day (BID) | ORAL | 0 refills | 7.00000 days | Status: AC
Start: 2018-04-04 — End: 2018-04-05

## 2018-04-04 MED ORDER — ERGOCALCIFEROL (VITAMIN D2) 1,250 MCG (50,000 UNIT) PO CAP
1 | ORAL_CAPSULE | ORAL | 0 refills | 56.00000 days | Status: AC
Start: 2018-04-04 — End: 2018-04-14

## 2018-04-04 NOTE — Assessment & Plan Note
Current pain meds not working, prefers MS Cont though 75 bid was not enough, will go to MS Cont 100 q12h, stop fent, use prn (prefers perc 10/325 at the moment) and f/u w/ [place] or pall care prn for further pain meds, with MiraLAX as needed

## 2018-04-05 ENCOUNTER — Encounter: Admit: 2018-04-05 | Discharge: 2018-04-05 | Payer: Commercial Managed Care - HMO

## 2018-04-05 DIAGNOSIS — C7971 Secondary malignant neoplasm of right adrenal gland: ICD-10-CM

## 2018-04-05 DIAGNOSIS — C50919 Malignant neoplasm of unspecified site of unspecified female breast: Principal | ICD-10-CM

## 2018-04-05 DIAGNOSIS — C771 Secondary and unspecified malignant neoplasm of intrathoracic lymph nodes: ICD-10-CM

## 2018-04-05 DIAGNOSIS — R7989 Other specified abnormal findings of blood chemistry: ICD-10-CM

## 2018-04-05 DIAGNOSIS — C787 Secondary malignant neoplasm of liver and intrahepatic bile duct: ICD-10-CM

## 2018-04-05 DIAGNOSIS — Z789 Other specified health status: ICD-10-CM

## 2018-04-05 DIAGNOSIS — Z79818 Long term (current) use of other agents affecting estrogen receptors and estrogen levels: ICD-10-CM

## 2018-04-05 DIAGNOSIS — C7951 Secondary malignant neoplasm of bone: ICD-10-CM

## 2018-04-05 DIAGNOSIS — C7900 Secondary malignant neoplasm of unspecified kidney and renal pelvis: ICD-10-CM

## 2018-04-05 DIAGNOSIS — Z170 Estrogen receptor positive status [ER+]: ICD-10-CM

## 2018-04-05 DIAGNOSIS — F329 Major depressive disorder, single episode, unspecified: ICD-10-CM

## 2018-04-05 DIAGNOSIS — Z9011 Acquired absence of right breast and nipple: ICD-10-CM

## 2018-04-05 DIAGNOSIS — K5900 Constipation, unspecified: ICD-10-CM

## 2018-04-05 DIAGNOSIS — C786 Secondary malignant neoplasm of retroperitoneum and peritoneum: ICD-10-CM

## 2018-04-05 DIAGNOSIS — K652 Spontaneous bacterial peritonitis: ICD-10-CM

## 2018-04-05 DIAGNOSIS — C782 Secondary malignant neoplasm of pleura: ICD-10-CM

## 2018-04-05 DIAGNOSIS — C50911 Malignant neoplasm of unspecified site of right female breast: Principal | ICD-10-CM

## 2018-04-05 DIAGNOSIS — G8929 Other chronic pain: ICD-10-CM

## 2018-04-05 DIAGNOSIS — J90 Pleural effusion, not elsewhere classified: ICD-10-CM

## 2018-04-05 DIAGNOSIS — Z87311 Personal history of (healed) other pathological fracture: ICD-10-CM

## 2018-04-05 LAB — COMPREHENSIVE METABOLIC PANEL
Lab: 1.6 mg/dL — ABNORMAL HIGH (ref 0.4–1.00)
Lab: 14 U/L (ref 7–56)
Lab: 3.7 g/dL (ref 3.5–5.0)
Lab: 30 mg/dL — ABNORMAL HIGH (ref 7–25)
Lab: 39 mL/min — ABNORMAL LOW (ref >60)
Lab: 6.7 g/dL — ABNORMAL LOW (ref 6.0–8.0)
Lab: 9.9 mg/dL (ref 8.5–10.6)
Lab: 99 mg/dL — ABNORMAL HIGH (ref 70–100)

## 2018-04-05 LAB — CBC AND DIFF
Lab: 3.5 M/UL — ABNORMAL LOW (ref 4.0–5.0)
Lab: 31 % — ABNORMAL LOW (ref 36–45)
Lab: 5.1 10*3/uL (ref 4.5–11.0)

## 2018-04-05 MED ORDER — PROCHLORPERAZINE MALEATE 10 MG PO TAB
10 mg | ORAL_TABLET | ORAL | 3 refills | Status: SS | PRN
Start: 2018-04-05 — End: 2018-05-22

## 2018-04-05 MED ORDER — ONDANSETRON HCL 4 MG PO TAB
4 mg | ORAL_TABLET | ORAL | 3 refills | Status: SS | PRN
Start: 2018-04-05 — End: 2018-05-22

## 2018-04-05 MED ORDER — LORAZEPAM 0.5 MG PO TAB
1 | ORAL_TABLET | ORAL | 2 refills | 12.00000 days | Status: AC | PRN
Start: 2018-04-05 — End: 2018-04-24
  Filled 2018-04-05 (×2): qty 30, 8d supply, fill #1

## 2018-04-05 MED ORDER — MORPHINE 15 MG PO TBER
ORAL_TABLET | Freq: Three times a day (TID) | ORAL | 0 refills | 7.00000 days | Status: AC
Start: 2018-04-05 — End: 2018-05-11

## 2018-04-05 MED ORDER — MORPHINE 15 MG PO TAB
ORAL_TABLET | ORAL | 0 refills | 7.00000 days | Status: AC | PRN
Start: 2018-04-05 — End: 2018-04-14

## 2018-04-05 MED ORDER — MORPHINE 60 MG PO TBER
ORAL_TABLET | Freq: Three times a day (TID) | ORAL | 0 refills | 7.00000 days | Status: AC
Start: 2018-04-05 — End: 2018-05-11
  Filled 2018-04-19 (×2): qty 90, 8d supply, fill #1

## 2018-04-05 MED FILL — ONDANSETRON HCL 4 MG PO TAB: 4 mg | ORAL | 10 days supply | Qty: 30 | Fill #1 | Status: CP

## 2018-04-05 MED FILL — PROCHLORPERAZINE MALEATE 10 MG PO TAB: 10 mg | ORAL | 8 days supply | Qty: 30 | Fill #1 | Status: CP

## 2018-04-05 NOTE — Patient Education
depending on your procedure.  6. You may be sedated for the procedure. A responsible adult must drive you home (no Uber, taxis or buses are allowed) and stay with you overnight. If you do not have a driver we will be unable to perform your procedure.   7. You will not be able to return to work or drive the same day if receiving sedation.

## 2018-04-05 NOTE — Progress Notes
Eyes: Negative for visual disturbance.   Respiratory: Positive for shortness of breath (uses 2L O2 at noc). Negative for cough.    Cardiovascular: Positive for leg swelling. Negative for chest pain.   Gastrointestinal: Positive for abdominal pain, constipation and nausea. Negative for abdominal distention and diarrhea.   Genitourinary: Negative.  Negative for difficulty urinating.   Musculoskeletal: Positive for back pain.        Pelvic pain   Skin: Negative for rash.   Neurological: Negative for dizziness, weakness, numbness and headaches.   Hematological: Negative for adenopathy.   Psychiatric/Behavioral: Negative for dysphoric mood. The patient is not nervous/anxious.          PMH: Negative  FAMILY HX:  No family history of breast or ovarian or colon cancer   SOCIAL HX: former smoker x30 years.  Quit in 2010. 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 No HRT. Post menopausal     Objective:         ??? calcium carbonate/vitamin D-3 (OS-CAL 500+D) 1250 mg/200 unit tablet Take one tablet by mouth twice daily with meals. Calcium Carb 1250mg  delivers 500mg  elemental Ca   ??? diphenhydramine HCl (BENADRYL ALLERGY PO) Take 50 mg by mouth at bedtime daily.   ??? ergocalciferol (VITAMIN D-2) 1,250 mcg (50,000 unit) capsule Take one capsule by mouth every 7 days.   ??? morphine SR (MS CONTIN) 100 mg ER tablet Take one tablet by mouth every 12 hours   ??? oxyCODONE/acetaminophen (PERCOCET) 10/325 mg tablet Take one tablet by mouth every 6 hours as needed for Pain   ??? pantoprazole DR (PROTONIX) 40 mg tablet TAKE 1 TABLET BY MOUTH EVERY DAY AS NEEDED   ??? polyethylene glycol 3350 (MIRALAX) 17 g packet Take one packet by mouth three times daily as needed for narcotic-induced constipation   ??? prochlorperazine maleate (COMPAZINE) 10 mg tablet Take one tablet by mouth every 6 hours as needed for Nausea or Vomiting.   ??? senna/docusate (SENOKOT-S) 8.6/50 mg tablet Take one tablet by mouth twice daily. Lab Results   Component Value Date/Time    CA 9.9 04/05/2018 03:58 PM    PO4 2.0 03/23/2018 04:13 AM    ALBUMIN 3.7 04/05/2018 03:58 PM    TOTPROT 6.7 04/05/2018 03:58 PM    ALKPHOS 215 (H) 04/05/2018 03:58 PM    AST 41 (H) 04/05/2018 03:58 PM    ALT 14 04/05/2018 03:58 PM    TOTBILI 0.4 04/05/2018 03:58 PM    GFR 32 (L) 04/05/2018 03:58 PM    GFRAA 39 (L) 04/05/2018 03:58 PM             Assessment and Plan:  1. 53 y.o. 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  She received adjuvant A/C, Taxol, radiation and 2 years of Tamoxifen.     2. In June 2018 she had abdominal distention and was noted to have ascites. Paracentesis 07/15/16 was negative for malignant cells. CT guided pelvic mass 07/19/16 revealed metastatic carcinoma (ER 100%, PR 31%, Her2 2+, FISH 1.04, Ki-67: 48%). Imaging reports from this time are unavailable. She started Faslodex/Ibrance 07/2016 and this was discontinued 09/2017 for progressive disease (increased aortocaval lymph node and slight increase in cystic mass in right lower quadrant).   Treatment was switched to exemestane and everolimus 10/2017-01/2018). Scans 02/16/18 revealed development of mediastinal and left IM nodes, development of right pleural effusion, slight increase in right lower quadrant  cystic mass, progression of right adrenal metastasis and very large retroperitoneal metastasis with encasement of proximal IVC.   03/14/18 EBUS of 4R lymph node FNA showed metastatic breast cancer, ER 98% positive, PR 5% positive, HER-2 0+, Ki-67 36%. Right thoracentesis 03/18/18 revealed highly atypical cells, consistent with metastatic carcinoma. Pathology report, prognostic markers, and imaging reviewed with Judeth Cornfield.      Given the extensive disease burden we recommend chemotherapy.  Recommend starting with Taxol 80mg /m2 weekly at this time.  Possible side effects of Taxol chemotherapy were discussed with the patient including but not

## 2018-04-05 NOTE — Patient Instructions
Team Members:  Dr. Priyanka Sharma, MD  Jaimie Heldstab, APRN  Lisa Her, RN  Raylea Adcox, RN    Contact information:   Office phone: [phone]  Office fax: [phone]  After hours phone: [phone]    Address:  [address], [address]  [place], [place] [address]

## 2018-04-06 ENCOUNTER — Encounter: Admit: 2018-04-06 | Discharge: 2018-04-06 | Payer: Commercial Managed Care - HMO

## 2018-04-06 ENCOUNTER — Ambulatory Visit: Admit: 2018-04-06 | Discharge: 2018-04-06 | Payer: Commercial Managed Care - HMO

## 2018-04-06 DIAGNOSIS — Z452 Encounter for adjustment and management of vascular access device: ICD-10-CM

## 2018-04-06 DIAGNOSIS — C50919 Malignant neoplasm of unspecified site of unspecified female breast: Principal | ICD-10-CM

## 2018-04-06 DIAGNOSIS — J90 Pleural effusion, not elsewhere classified: ICD-10-CM

## 2018-04-06 MED ORDER — NALOXONE 0.4 MG/ML IJ SOLN
.08 mg | INTRAVENOUS | 0 refills | Status: DC | PRN
Start: 2018-04-06 — End: 2018-04-07

## 2018-04-06 MED ORDER — FENTANYL CITRATE (PF) 50 MCG/ML IJ SOLN
25-50 ug | Freq: Once | INTRAVENOUS | 0 refills | Status: CP
Start: 2018-04-06 — End: ?
  Administered 2018-04-06: 21:00:00 50 ug via INTRAVENOUS

## 2018-04-06 MED ORDER — MIDAZOLAM 1 MG/ML IJ SOLN
0 refills | Status: CP
Start: 2018-04-06 — End: ?
  Administered 2018-04-06: 21:00:00 1 mg via INTRAVENOUS

## 2018-04-06 MED ORDER — DIPHENHYDRAMINE HCL 50 MG/ML IJ SOLN
50 mg | Freq: Once | INTRAVENOUS | 0 refills | Status: CN
Start: 2018-04-06 — End: ?

## 2018-04-06 MED ORDER — OXYCODONE 5 MG PO TAB
10 mg | Freq: Once | ORAL | 0 refills | Status: CP
Start: 2018-04-06 — End: ?
  Administered 2018-04-06: 23:00:00 10 mg via ORAL

## 2018-04-06 MED ORDER — HEPARIN, PORCINE (PF) 100 UNIT/ML IV SYRG
500 [IU] | Freq: Once | 0 refills | Status: CP
Start: 2018-04-06 — End: ?

## 2018-04-06 MED ORDER — DEXAMETHASONE IVPB
10 mg | Freq: Once | INTRAVENOUS | 0 refills | Status: CN
Start: 2018-04-06 — End: ?

## 2018-04-06 MED ORDER — PACLITAXEL IVPB
80 mg/m2 | Freq: Once | INTRAVENOUS | 0 refills | Status: CN
Start: 2018-04-06 — End: ?

## 2018-04-06 MED ORDER — MIDAZOLAM 1 MG/ML IJ SOLN
1 mg | Freq: Once | INTRAVENOUS | 0 refills | Status: CP
Start: 2018-04-06 — End: ?
  Administered 2018-04-06: 21:00:00 1 mg via INTRAVENOUS

## 2018-04-06 MED ORDER — FAMOTIDINE (PF) 20 MG/2 ML IV SOLN
20 mg | Freq: Once | INTRAVENOUS | 0 refills | Status: CN
Start: 2018-04-06 — End: ?

## 2018-04-06 MED ORDER — FENTANYL CITRATE (PF) 50 MCG/ML IJ SOLN
0 refills | Status: CP
Start: 2018-04-06 — End: ?
  Administered 2018-04-06 (×3): 50 ug via INTRAVENOUS

## 2018-04-06 MED ORDER — POTASSIUM CHLORIDE 10 MEQ PO TBER
10 meq | ORAL_TABLET | Freq: Every day | ORAL | 1 refills | Status: SS
Start: 2018-04-06 — End: 2018-05-22

## 2018-04-06 MED ORDER — FLUMAZENIL 0.1 MG/ML IV SOLN
.2 mg | INTRAVENOUS | 0 refills | Status: DC | PRN
Start: 2018-04-06 — End: 2018-04-07

## 2018-04-06 MED ORDER — SODIUM CHLORIDE 0.9 % IV SOLP
1000 mL | Freq: Once | INTRAVENOUS | 0 refills | Status: CP
Start: 2018-04-06 — End: ?
  Administered 2018-04-06: 21:00:00 1000 mL via INTRAVENOUS

## 2018-04-06 NOTE — H&P (View-Only)
Pre Procedure History and Physical/Sedation Plan      Procedure Date:  04/06/2018    Planned Procedure(s): Right Pleur-X catheter placement; Chest port placement    Indication for exam: Recurrent pleural effusion; Chemotherapy  ________________________________________________________________    Chief Complaint:   Breast cancer     Previous Anesthetic/Sedation History:  Reviewed    Allergies:  Patient has no known allergies.  Medications:  Scheduled Meds:Continuous Infusions:  PRN and Respiratory Meds:       Vital Signs:  Last Filed Vital Signs: 24 Hour Range   BP: 105/71 (03/11 1608)  Temp: 36.4 ???C (97.5 ???F) (03/11 1608)  Pulse: 107 (03/11 1608)  SpO2: 98 % (03/11 1608)  Height: 161.3 cm (63.5) (03/11 1608) BP: (105)/(71)   Temp:  [36.4 ???C (97.5 ???F)]   Pulse:  [107]   SpO2:  [98 %]      Pre-procedure anxiolysis plan: NA  Sedation/Medication Plan: Fentanyl, Lidocaine and Midazolam  Personal history of sedation complications: Denies adverse event.   Family history of sedation complications: Denies adverse event.   Medications for Reversal: Naloxone and Flumazenil  Discussion/Reviews:  Physician has discussed risks and alternatives of this type of sedation and above planned procedures with patient    NPO Status: Acceptable  Airway:  airway assessment performed  Mallampati II (soft palate, uvula, fauces visible)  Head and Neck: no abnormalities noted  Mouth: no abnormalities noted   Anesthesia Classification:  ASA III (A patient with a severe systemic disease that limits activity, but is not incapacitating)  Pregnancy Status: Not Pregnant    Lab/Radiology/Other Diagnostic Tests  Labs:  Relevant labs reviewed    I have examined the patient, and there are no significant changes in their condition, from the previous H&P performed on 04/05/18.    Lorelee Market, APRN-NP  Pager [phone]

## 2018-04-07 ENCOUNTER — Encounter: Admit: 2018-04-07 | Discharge: 2018-04-07 | Payer: Commercial Managed Care - HMO

## 2018-04-07 DIAGNOSIS — C50911 Malignant neoplasm of unspecified site of right female breast: Principal | ICD-10-CM

## 2018-04-07 DIAGNOSIS — Z9011 Acquired absence of right breast and nipple: ICD-10-CM

## 2018-04-07 DIAGNOSIS — C771 Secondary and unspecified malignant neoplasm of intrathoracic lymph nodes: ICD-10-CM

## 2018-04-07 DIAGNOSIS — Z170 Estrogen receptor positive status [ER+]: ICD-10-CM

## 2018-04-07 DIAGNOSIS — C7900 Secondary malignant neoplasm of unspecified kidney and renal pelvis: ICD-10-CM

## 2018-04-07 DIAGNOSIS — C7971 Secondary malignant neoplasm of right adrenal gland: ICD-10-CM

## 2018-04-07 DIAGNOSIS — C787 Secondary malignant neoplasm of liver and intrahepatic bile duct: ICD-10-CM

## 2018-04-07 DIAGNOSIS — C782 Secondary malignant neoplasm of pleura: ICD-10-CM

## 2018-04-07 DIAGNOSIS — Z79818 Long term (current) use of other agents affecting estrogen receptors and estrogen levels: ICD-10-CM

## 2018-04-07 DIAGNOSIS — C786 Secondary malignant neoplasm of retroperitoneum and peritoneum: ICD-10-CM

## 2018-04-07 DIAGNOSIS — C7951 Secondary malignant neoplasm of bone: ICD-10-CM

## 2018-04-07 LAB — COMPREHENSIVE METABOLIC PANEL
Lab: 0.5 mg/dL (ref 0.3–1.2)
Lab: 1.5 mg/dL — ABNORMAL HIGH (ref 0.4–1.00)
Lab: 106 mg/dL — ABNORMAL HIGH (ref 70–100)
Lab: 135 MMOL/L — ABNORMAL LOW (ref 137–147)
Lab: 15 U/L (ref 7–56)
Lab: 16 10*3/uL — ABNORMAL HIGH (ref 3–12)
Lab: 224 U/L — ABNORMAL HIGH (ref 25–110)
Lab: 28 MMOL/L (ref 21–30)
Lab: 3.1 MMOL/L — ABNORMAL LOW (ref 3.5–5.1)
Lab: 3.5 g/dL — ABNORMAL HIGH (ref 3.5–5.0)
Lab: 31 mg/dL — ABNORMAL HIGH (ref 7–25)
Lab: 34 mL/min — ABNORMAL LOW (ref >60)
Lab: 41 mL/min — ABNORMAL LOW (ref >60)
Lab: 45 U/L — ABNORMAL HIGH (ref 7–40)
Lab: 6.6 g/dL (ref 6.0–8.0)
Lab: 9.1 mg/dL — ABNORMAL HIGH (ref 8.5–10.6)
Lab: 91 MMOL/L — ABNORMAL LOW (ref 98–110)

## 2018-04-07 LAB — CBC AND DIFF
Lab: 0 10*3/uL (ref 0–0.45)
Lab: 0.1 10*3/uL (ref 0–0.20)
Lab: 5.3 10*3/uL (ref 4.5–11.0)

## 2018-04-07 MED ORDER — FAMOTIDINE (PF) 20 MG/2 ML IV SOLN
20 mg | Freq: Once | INTRAVENOUS | 0 refills | Status: CP
Start: 2018-04-07 — End: ?
  Administered 2018-04-07: 17:00:00 20 mg via INTRAVENOUS

## 2018-04-07 MED ORDER — DIPHENHYDRAMINE HCL 50 MG/ML IJ SOLN
50 mg | Freq: Once | INTRAVENOUS | 0 refills | Status: CP
Start: 2018-04-07 — End: ?
  Administered 2018-04-07: 17:00:00 50 mg via INTRAVENOUS

## 2018-04-07 MED ORDER — LIDOCAINE-PRILOCAINE 2.5-2.5 % TP CREA
0 refills | Status: AC
Start: 2018-04-07 — End: 2018-04-07
  Filled 2018-04-07 (×2): qty 30, 10d supply, fill #1

## 2018-04-07 MED ORDER — PACLITAXEL IVPB
80 mg/m2 | Freq: Once | INTRAVENOUS | 0 refills | Status: CP
Start: 2018-04-07 — End: ?
  Administered 2018-04-07 (×2): 139.2 mg via INTRAVENOUS

## 2018-04-07 MED ORDER — LIDOCAINE-PRILOCAINE 2.5-2.5 % TP CREA
0 refills | Status: AC
Start: 2018-04-07 — End: 2018-05-09

## 2018-04-07 MED ORDER — DEXAMETHASONE IVPB
10 mg | Freq: Once | INTRAVENOUS | 0 refills | Status: CP
Start: 2018-04-07 — End: ?
  Administered 2018-04-07 (×2): 10 mg via INTRAVENOUS

## 2018-04-07 MED ORDER — PACLITAXEL IVPB
80 mg/m2 | Freq: Once | INTRAVENOUS | 0 refills | Status: CN
Start: 2018-04-07 — End: ?

## 2018-04-07 MED ORDER — DIPHENHYDRAMINE HCL 50 MG/ML IJ SOLN
50 mg | Freq: Once | INTRAVENOUS | 0 refills | Status: CN
Start: 2018-04-07 — End: ?

## 2018-04-07 MED ORDER — DEXAMETHASONE IVPB
10 mg | Freq: Once | INTRAVENOUS | 0 refills | Status: CN
Start: 2018-04-07 — End: ?

## 2018-04-07 MED ORDER — HEPARIN, PORCINE (PF) 100 UNIT/ML IV SYRG
500 [IU] | Freq: Once | 0 refills | Status: CP
Start: 2018-04-07 — End: ?

## 2018-04-07 MED ORDER — FAMOTIDINE (PF) 20 MG/2 ML IV SOLN
20 mg | Freq: Once | INTRAVENOUS | 0 refills | Status: CN
Start: 2018-04-07 — End: ?

## 2018-04-07 MED ADMIN — FAMOTIDINE (PF) 20 MG/2 ML IV SOLN [166077]: 20 mg | INTRAVENOUS | @ 18:00:00 | Stop: 2018-04-07 | NDC 00641602201

## 2018-04-07 NOTE — Patient Instructions
Call Immediately to report the following:  Unexplained bleeding or bleeding that will not stop  Difficulty swallowing  Shortness of breath, wheezing, or trouble breathing  Rapid, irregular heartbeat; chest pain  Dizziness, lightheadedness  Rash or cut that swells or turns red, feels hot or painful, or begin to ooze  Diarrhea   Uncontrolled nausea or vomiting  Fever of 100.4 F or higher, or chills    Important Phone Numbers:  Cancer Center Main Number (answered 24 hours a day) [phone]  Cancer Center Scheduling (appointments) [phone]  Social Worker [phone]  DEHYDRATION  Everything in the body contains fluid (water). The human body must have a certain amount of liquid, and reduced amounts of fluid in the body can cause changes in how a person feels. Fluid balance means that the body's fluids are properly regulated and in the right places. Swelling is too much water in the body. Dehydration is not having enough water in the body or not having enough fluid where it is needed in the body.  What to look for   ??? Dry mouth, thirst   ??? Dizziness, weakness, constipation   ??? Dry food is hard to swallow   ??? Dry or sticky tissues in the mouth make it hard to talk   ??? Dry skin, skin that tents (stays up) when lightly pinched   ??? Swollen, cracked, or dry tongue   ??? Fever (see section on fever)   ??? Weight loss   ??? Little or no urine   ??? Fatigue   ??? Sunken eyeballs   What the patient can do   ??? Drink fluids. Sometimes iced fluids are easier.   ??? Remember that food contains fluid. Try to eat fruits, vegetables, soups, gelatins, popsicles, and other moist foods.   ??? Apply lotion often to soften dry skin.   ??? Try to get rid of the cause of dehydration, such as vomiting, diarrhea or fever.   ??? Apply lubricant to lips to avoid painful cracking.   ??? If it is tiring to get up, fill a small cooler with ice and small cans of juice or bottled water and keep it next to you. ??? Avoid foods and drinks that cause gas such as cabbage, broccoli, and fizzy drinks.   ??? Avoid or cut back on any foods that make you constipated, such as cheese or eggs.   ??? Get as much light exercise as you can.   ??? Do not use enemas or suppositories. Use stool softeners or laxatives only after talking with your doctor or nurse.   ??? Go to the bathroom as soon as you have the urge to have a bowel movement.   ??? Keep a record of bowel movements so that problems can be noticed quickly.   What caregivers can do   ??? Offer prune juice, hot lemon water, coffee, or tea to help stimulate bowel movements.   ??? Encourage extra fluids.   ??? Help keep a record of bowel movements.   ??? Offer high fiber foods such as whole grains, dried fruits, and bran.   ??? Talk with the doctor before using laxatives.   Call the doctor if the patient:   ??? has not had a bowel movement in 48 hours   ??? has blood in or around anal area or in stool   ??? cannot move bowels within 1 or 2 days after taking laxative   ???  has cramps or vomiting that doesn???t stop  [employer] www.cancer.org  FATIGUE  Fatigue is when a person has less energy to do the things he or she normally does, or wants to do. Fatigue is the most common side effect of cancer treatment. This fatigue is different from that of everyday life. Fatigue related to cancer treatment can appear suddenly and can be overwhelming. It is not relieved by rest. It can last for months after treatment ends. This type of fatigue can affect many aspects of a person???s life, including the ability to do his usual activities.   Cancer fatigue is real and should not be ignored. It can be worse when a person is dehydrated, anemic, in pain, not sleeping well, or has an infection. Recent studies have shown that exercise programs during treatment can help reduce fatigue.   What to Look For   ??? Feeling like you have no energy   ??? Sleeping more than normal phone until they are well again.   ??? Offer extra fluids and snacks.   ??? Help the patient take medicines on schedule.   ??? Call the doctor if the patient if the patient is confused, doesn???t know where he or she is, becomes forgetful, or isn???t making sense.  Call the doctor if the patient:   ??? has a temperature of 100.5???F or higher, taken by mouth   ??? has 2 or more of the symptoms listed under ???What to look for???   ??? has fever lasting for more than 24 hours   ??? has shaking chills   ??? cannot take fluids  [employer] www.cancer.org

## 2018-04-07 NOTE — Progress Notes
Vitals taken before pt discharge 1616: bp 122/81 pulse rr 15 pulse 106 and spo2 96

## 2018-04-10 ENCOUNTER — Encounter: Admit: 2018-04-10 | Discharge: 2018-04-10 | Payer: Commercial Managed Care - HMO

## 2018-04-10 ENCOUNTER — Ambulatory Visit: Admit: 2018-04-10 | Discharge: 2018-04-25 | Payer: Commercial Managed Care - HMO

## 2018-04-10 DIAGNOSIS — J90 Pleural effusion, not elsewhere classified: ICD-10-CM

## 2018-04-10 DIAGNOSIS — G8929 Other chronic pain: ICD-10-CM

## 2018-04-10 DIAGNOSIS — F329 Major depressive disorder, single episode, unspecified: ICD-10-CM

## 2018-04-10 DIAGNOSIS — Z87311 Personal history of (healed) other pathological fracture: ICD-10-CM

## 2018-04-10 DIAGNOSIS — Z789 Other specified health status: ICD-10-CM

## 2018-04-10 DIAGNOSIS — K652 Spontaneous bacterial peritonitis: ICD-10-CM

## 2018-04-10 DIAGNOSIS — C7951 Secondary malignant neoplasm of bone: ICD-10-CM

## 2018-04-10 DIAGNOSIS — C50919 Malignant neoplasm of unspecified site of unspecified female breast: Principal | ICD-10-CM

## 2018-04-10 DIAGNOSIS — K5900 Constipation, unspecified: ICD-10-CM

## 2018-04-10 NOTE — Progress Notes
??? morphine SR (MS CONTIN) 60 mg ER tablet Take 75mg  three times a day   ??? ondansetron (ZOFRAN) 4 mg tablet Take one tablet by mouth every 8 hours as needed for Nausea.   ??? pantoprazole DR (PROTONIX) 40 mg tablet TAKE 1 TABLET BY MOUTH EVERY DAY AS NEEDED   ??? polyethylene glycol 3350 (MIRALAX) 17 g packet Take one packet by mouth three times daily as needed for narcotic-induced constipation   ??? potassium chloride (K-DUR) 10 mEq tablet Take one tablet by mouth daily. Take with a meal and a full glass of water.   ??? prochlorperazine maleate (COMPAZINE) 10 mg tablet Take one tablet by mouth every 6 hours as needed for Nausea or Vomiting.   ??? senna/docusate (SENOKOT-S) 8.6/50 mg tablet Take one tablet by mouth twice daily.   ??? sertraline (ZOLOFT) 100 mg tablet Take one tablet by mouth twice daily.   ??? triamcinolone acetonide (KENALOG) 0.025 % topical cream Apply  topically to affected area twice daily. (Patient taking differently: Apply  topically to affected area twice daily as needed.)   ??? umeclidinium-vilanteroL (ANORO ELLIPTA) 62.5-25 mcg/actuation inhaler Inhale one puff by mouth into the lungs daily.     Vitals:    04/10/18 1332 04/10/18 1334   BP: 100/70    BP Source: Arm, Left Upper    Patient Position: Sitting    Pulse: 117    Resp: 14    Temp: 36.6 ???C (97.9 ???F)    TempSrc: Oral    SpO2: 96%    Weight: 70.4 kg (155 lb 3.2 oz)    Height: 161.3 cm (63.5)    PainSc:  Zero     Body mass index is 27.06 kg/m???.     Pain Score: Zero       Fatigue Scale: 7    Pain Addressed:  Prescription provided for pain management    Patient Evaluated for a Clinical Trial: No treatment clinical trial available for this patient.     Donna Tyler Cooperative Oncology Group performance status is 1, Restricted in physically strenuous activity but ambulatory and able to carry out work of a light or sedentary nature, e.g., light house work, office work.     Physical Exam  Constitutional:       General: She is not in acute distress. 02/16/18 revealed development of mediastinal and left IM nodes, development of right pleural effusion, slight increase in right lower quadrant cystic mass, progression of right adrenal metastasis and very large retroperitoneal metastasis with encasement of proximal IVC.   03/14/18 EBUS of 4R lymph node FNA showed metastatic breast cancer, ER 98% positive, PR 5% positive, HER-2 0+, Ki-67 36%. Right thoracentesis 03/18/18 revealed highly atypical cells, consistent with metastatic carcinoma.   Given the extensive disease burden we recommended chemotherapy. She started Taxol weekly 04/07/18.      2. Dr. Tiburcio Pea had requested PIK3 testing off EBUS sample which was an FNA.  Will look into these results but given scant material NGS may or may not be successful.  Guardant 360 testing pending.     3. Genetic Testing: In process.     4.  Pain in back and abdomen, related to malignancy. Recommend MS Contin 75mg  tid. However she just picked up a rx for MS Contin 100mg  bid. Continue at this time and will supplement with Morphine IR 15-30mg  q4 hours PRN for breakthrough pain. Will adjust as needed.     5. Significant lower extremity edema, most likely from encacement and severe narrowing  of suprahepatic IVC.  Symptoms are stable.  Discussed it may take a few weeks of chemotherapy before symptoms start to improve.  She was asked to call for any worsening symptoms.     6.  Elevated creatinine. Could be related to large retroperitoneal mass causing obstruction. She is scheduled for renal US  to look for hydronephrosis.     7.  Osseous metastatic disease:  Will get bone scan now and discuss starting xgeva monthly.     8. Pleural effusions: Pleurx placed 04/06/18. Will set up home health.      RTC in 2 weeks with lab and Taxol     Donna Massing, APRN-NP    Collaborating MD: Osborn Coho

## 2018-04-10 NOTE — Progress Notes
Plan: [facility] RN for Right Pleur-X catheter management.     Interaction: SW was notified by Susa Raring that pt needs HH for Right Pleur-X catheter management. Pt has [employer] therefore SW faxed [facility] RN order to [facility]: [phone], fax [phone] for them to assign a [facility] vendor.     SW will continue to follow up with [facility] for home health agency information and start of care.     Harmon Dun, LMSW

## 2018-04-11 ENCOUNTER — Encounter: Admit: 2018-04-11 | Discharge: 2018-04-11 | Payer: Commercial Managed Care - HMO

## 2018-04-12 ENCOUNTER — Encounter: Admit: 2018-04-12 | Discharge: 2018-04-12 | Payer: Commercial Managed Care - HMO

## 2018-04-12 ENCOUNTER — Encounter: Admit: 2018-04-12 | Discharge: 2018-04-25 | Payer: Commercial Managed Care - HMO

## 2018-04-12 ENCOUNTER — Encounter: Admit: 2018-04-12 | Discharge: 2018-04-13 | Payer: Commercial Managed Care - HMO

## 2018-04-12 DIAGNOSIS — C50919 Malignant neoplasm of unspecified site of unspecified female breast: Principal | ICD-10-CM

## 2018-04-12 MED ORDER — DIAZEPAM 2 MG PO TAB
2 mg | ORAL_TABLET | ORAL | 0 refills | 7.00000 days | Status: AC | PRN
Start: 2018-04-12 — End: 2018-04-14
  Filled 2018-04-12: qty 2, 1d supply

## 2018-04-12 MED ORDER — RP DX TC-99M MEDRONATE MCI
25 | Freq: Once | INTRAVENOUS | 0 refills | Status: CP
Start: 2018-04-12 — End: ?
  Administered 2018-04-12: 16:00:00 24.5 via INTRAVENOUS

## 2018-04-12 MED ORDER — HEPARIN, PORCINE (PF) 100 UNIT/ML IV SYRG
500 [IU] | Freq: Once | 0 refills | Status: DC
Start: 2018-04-12 — End: 2018-04-17

## 2018-04-12 MED ORDER — HEPARIN, PORCINE (PF) 100 UNIT/ML IV SYRG
500 [IU] | Freq: Once | 0 refills | Status: CP
Start: 2018-04-12 — End: ?

## 2018-04-12 NOTE — Progress Notes
Plan: Follow up    Interaction: SW received call form Lilly with [facility]: [phone] she is still trying to find a [facility] agency to accept pt: She is having difficulity finding an agency due to the COVID-19 virus. Ref # [id number]. Harmon Dun, LMSW

## 2018-04-13 ENCOUNTER — Encounter: Admit: 2018-04-13 | Discharge: 2018-04-13 | Payer: Commercial Managed Care - HMO

## 2018-04-13 DIAGNOSIS — C787 Secondary malignant neoplasm of liver and intrahepatic bile duct: ICD-10-CM

## 2018-04-13 DIAGNOSIS — C7951 Secondary malignant neoplasm of bone: ICD-10-CM

## 2018-04-13 DIAGNOSIS — Z9011 Acquired absence of right breast and nipple: ICD-10-CM

## 2018-04-13 DIAGNOSIS — C7971 Secondary malignant neoplasm of right adrenal gland: ICD-10-CM

## 2018-04-13 DIAGNOSIS — C50911 Malignant neoplasm of unspecified site of right female breast: Principal | ICD-10-CM

## 2018-04-13 DIAGNOSIS — Z170 Estrogen receptor positive status [ER+]: ICD-10-CM

## 2018-04-13 DIAGNOSIS — C50919 Malignant neoplasm of unspecified site of unspecified female breast: Principal | ICD-10-CM

## 2018-04-13 DIAGNOSIS — Z79818 Long term (current) use of other agents affecting estrogen receptors and estrogen levels: ICD-10-CM

## 2018-04-13 DIAGNOSIS — C771 Secondary and unspecified malignant neoplasm of intrathoracic lymph nodes: ICD-10-CM

## 2018-04-13 DIAGNOSIS — C7900 Secondary malignant neoplasm of unspecified kidney and renal pelvis: ICD-10-CM

## 2018-04-13 DIAGNOSIS — C786 Secondary malignant neoplasm of retroperitoneum and peritoneum: ICD-10-CM

## 2018-04-13 DIAGNOSIS — R410 Disorientation, unspecified: ICD-10-CM

## 2018-04-13 DIAGNOSIS — R112 Nausea with vomiting, unspecified: ICD-10-CM

## 2018-04-13 LAB — CBC AND DIFF
Lab: 0 10*3/uL (ref 0–0.20)
Lab: 0 10*3/uL (ref 0–0.45)

## 2018-04-13 MED ORDER — DEXAMETHASONE SODIUM PHOSPHATE 10 MG/ML IJ SOLN
10 mg | Freq: Once | INTRAVENOUS | 0 refills | Status: CP
Start: 2018-04-13 — End: ?
  Administered 2018-04-13: 20:00:00 10 mg via INTRAVENOUS

## 2018-04-13 MED ORDER — HEPARIN, PORCINE (PF) 100 UNIT/ML IV SYRG
500 [IU] | Freq: Once | 0 refills | Status: CP
Start: 2018-04-13 — End: ?

## 2018-04-13 MED ORDER — PACLITAXEL IVPB
80 mg/m2 | Freq: Once | INTRAVENOUS | 0 refills | Status: CP
Start: 2018-04-13 — End: ?
  Administered 2018-04-13 (×2): 139.2 mg via INTRAVENOUS

## 2018-04-13 MED ORDER — FAMOTIDINE (PF) 20 MG/2 ML IV SOLN
20 mg | Freq: Once | INTRAVENOUS | 0 refills | Status: CP
Start: 2018-04-13 — End: ?
  Administered 2018-04-13: 20:00:00 20 mg via INTRAVENOUS

## 2018-04-13 MED ORDER — POTASSIUM CHLORIDE 20 MEQ PO TBTQ
60 meq | Freq: Once | ORAL | 0 refills | Status: CP
Start: 2018-04-13 — End: ?
  Administered 2018-04-13: 20:00:00 60 meq via ORAL

## 2018-04-13 MED ORDER — DIPHENHYDRAMINE HCL 50 MG/ML IJ SOLN
50 mg | Freq: Once | INTRAVENOUS | 0 refills | Status: CP
Start: 2018-04-13 — End: ?
  Administered 2018-04-13: 20:00:00 50 mg via INTRAVENOUS

## 2018-04-13 MED ORDER — FAMOTIDINE (PF) 20 MG/2 ML IV SOLN
20 mg | Freq: Once | INTRAVENOUS | 0 refills | Status: CN
Start: 2018-04-13 — End: ?

## 2018-04-13 MED ORDER — DIPHENHYDRAMINE HCL 50 MG/ML IJ SOLN
50 mg | Freq: Once | INTRAVENOUS | 0 refills | Status: CN
Start: 2018-04-13 — End: ?

## 2018-04-13 MED ORDER — PACLITAXEL IVPB
80 mg/m2 | Freq: Once | INTRAVENOUS | 0 refills | Status: CN
Start: 2018-04-13 — End: ?

## 2018-04-13 NOTE — Progress Notes
Plan; HH RN arranged through [facility].     Interaction: SW received call from [place] with [facility]: they have staffed pt's [facility] RN needs with [facility]. [facility] to confirm [facility] received fax: they confirmed they received and have accepted pt onto service. They will call pt to arrange start of care. Harmon Dun, LMSW

## 2018-04-13 NOTE — Progress Notes
CHEMO NOTE  Verified chemo consent signed and in chart.    Verified initiate chemo order in O2    Blood return positive via: Port (Single)    BSA and dose double checked (agree with orders as written) with: yes     Labs/applicable tests checked: CBC and Comprehensive Metabolic Panel (CMP)    Chemo regime: Drug/cycle/day C2D1 Taxol    Rate verified and armband double checkwith second RN: yes    Patient education offered and stated understanding. Denies questions at this time.    Pt ambulated into clinic today for Taxol. O2 low on assessment, low 90s, pt reports she uses O2 at home when sleeping. Other vitals stable, no new issues reported on assessment. Pt seemed very sleepy while speaking with her, eyes would spontaneously close. She brought up nausea and pain concerns to me. While assessing, it was apparent to this RN that she may be confused with her pain med regimen. While giving pre meds and PO K+ pt vomited. Post benadryl, pt was reaching for things in the air and mumbling. Notified [facility] NP of all issues/concerns. She scheduled MRI of brain for pt tomorrow, pt notified. Port in place, blood return noted. Taxol given, tolerated well. Port deaccessed, heparin locked. Pt discharged in stable condition to ride home with daughter.

## 2018-04-14 ENCOUNTER — Encounter: Admit: 2018-04-14 | Discharge: 2018-04-14 | Payer: Commercial Managed Care - HMO

## 2018-04-14 ENCOUNTER — Ambulatory Visit: Admit: 2018-04-14 | Discharge: 2018-04-14 | Payer: Commercial Managed Care - HMO

## 2018-04-14 DIAGNOSIS — C50919 Malignant neoplasm of unspecified site of unspecified female breast: Principal | ICD-10-CM

## 2018-04-14 DIAGNOSIS — R112 Nausea with vomiting, unspecified: ICD-10-CM

## 2018-04-14 DIAGNOSIS — R410 Disorientation, unspecified: ICD-10-CM

## 2018-04-14 DIAGNOSIS — R7989 Other specified abnormal findings of blood chemistry: Secondary | ICD-10-CM

## 2018-04-14 MED ORDER — DIAZEPAM 5 MG PO TAB
5 mg | ORAL_TABLET | ORAL | 0 refills | 7.00000 days | Status: AC | PRN
Start: 2018-04-14 — End: 2018-05-11
  Filled 2018-04-14 (×2): qty 3, 1d supply, fill #1

## 2018-04-14 MED ORDER — MORPHINE 15 MG PO TAB
ORAL_TABLET | 0 refills | Status: SS | PRN
Start: 2018-04-14 — End: 2018-05-22

## 2018-04-16 ENCOUNTER — Encounter: Admit: 2018-04-16 | Discharge: 2018-04-16 | Payer: Commercial Managed Care - HMO

## 2018-04-17 ENCOUNTER — Encounter: Admit: 2018-04-17 | Discharge: 2018-04-17 | Payer: Commercial Managed Care - HMO

## 2018-04-17 DIAGNOSIS — C50919 Malignant neoplasm of unspecified site of unspecified female breast: Principal | ICD-10-CM

## 2018-04-17 DIAGNOSIS — R112 Nausea with vomiting, unspecified: ICD-10-CM

## 2018-04-18 ENCOUNTER — Encounter: Admit: 2018-04-18 | Discharge: 2018-04-18 | Payer: Commercial Managed Care - HMO

## 2018-04-18 MED ORDER — FAMOTIDINE (PF) 20 MG/2 ML IV SOLN
20 mg | Freq: Once | INTRAVENOUS | 0 refills | Status: CN
Start: 2018-04-18 — End: ?

## 2018-04-18 MED ORDER — PACLITAXEL IVPB
80 mg/m2 | Freq: Once | INTRAVENOUS | 0 refills | Status: CN
Start: 2018-04-18 — End: ?

## 2018-04-18 MED ORDER — DIPHENHYDRAMINE HCL 50 MG/ML IJ SOLN
50 mg | Freq: Once | INTRAVENOUS | 0 refills | Status: CN
Start: 2018-04-18 — End: ?

## 2018-04-19 ENCOUNTER — Encounter: Admit: 2018-04-19 | Discharge: 2018-04-19 | Payer: Commercial Managed Care - HMO

## 2018-04-19 DIAGNOSIS — C50919 Malignant neoplasm of unspecified site of unspecified female breast: Principal | ICD-10-CM

## 2018-04-20 ENCOUNTER — Encounter: Admit: 2018-04-20 | Discharge: 2018-04-20 | Payer: Commercial Managed Care - HMO

## 2018-04-20 DIAGNOSIS — C782 Secondary malignant neoplasm of pleura: ICD-10-CM

## 2018-04-20 DIAGNOSIS — Z9011 Acquired absence of right breast and nipple: ICD-10-CM

## 2018-04-20 DIAGNOSIS — C787 Secondary malignant neoplasm of liver and intrahepatic bile duct: ICD-10-CM

## 2018-04-20 DIAGNOSIS — C7951 Secondary malignant neoplasm of bone: ICD-10-CM

## 2018-04-20 DIAGNOSIS — Z170 Estrogen receptor positive status [ER+]: ICD-10-CM

## 2018-04-20 DIAGNOSIS — C50911 Malignant neoplasm of unspecified site of right female breast: Principal | ICD-10-CM

## 2018-04-20 DIAGNOSIS — Z79818 Long term (current) use of other agents affecting estrogen receptors and estrogen levels: ICD-10-CM

## 2018-04-20 DIAGNOSIS — C771 Secondary and unspecified malignant neoplasm of intrathoracic lymph nodes: ICD-10-CM

## 2018-04-20 DIAGNOSIS — C7971 Secondary malignant neoplasm of right adrenal gland: ICD-10-CM

## 2018-04-20 DIAGNOSIS — C50919 Malignant neoplasm of unspecified site of unspecified female breast: ICD-10-CM

## 2018-04-20 DIAGNOSIS — C7900 Secondary malignant neoplasm of unspecified kidney and renal pelvis: ICD-10-CM

## 2018-04-20 DIAGNOSIS — C786 Secondary malignant neoplasm of retroperitoneum and peritoneum: ICD-10-CM

## 2018-04-20 LAB — CBC AND DIFF
Lab: 0 10*3/uL (ref 0–0.45)
Lab: 0.1 10*3/uL (ref 0–0.20)
Lab: 0.3 10*3/uL (ref 0–0.80)
Lab: 0.6 10*3/uL — ABNORMAL LOW (ref 1.0–4.8)
Lab: 1 % (ref 0–5)
Lab: 1.9 10*3/uL (ref 1.8–7.0)
Lab: 10 % (ref 4–12)
Lab: 19 % — ABNORMAL HIGH (ref 11–15)
Lab: 2 % (ref 0–2)
Lab: 2.8 10*3/uL — ABNORMAL LOW (ref 4.5–11.0)
Lab: 21 % — ABNORMAL LOW (ref 24–44)
Lab: 241 10*3/uL (ref 150–400)
Lab: 26 % — ABNORMAL LOW (ref 36–45)
Lab: 28 pg (ref 26–34)
Lab: 3 M/UL — ABNORMAL LOW (ref 4.0–5.0)
Lab: 31 g/dL — ABNORMAL LOW (ref 32.0–36.0)
Lab: 66 % (ref 41–77)
Lab: 8.6 g/dL — ABNORMAL LOW (ref 12.0–15.0)
Lab: 88 FL (ref 80–100)
Lab: 9.2 FL (ref 7–11)

## 2018-04-20 LAB — COMPREHENSIVE METABOLIC PANEL
Lab: 0.3 mg/dL (ref 0.3–1.2)
Lab: 0.9 mg/dL — ABNORMAL LOW (ref >60)
Lab: 101 MMOL/L (ref 98–110)
Lab: 11 mg/dL (ref 7–25)
Lab: 138 MMOL/L — ABNORMAL LOW (ref 137–147)
Lab: 14 U/L (ref 7–56)
Lab: 194 U/L — ABNORMAL HIGH (ref 25–110)
Lab: 28 MMOL/L (ref 21–30)
Lab: 3.9 MMOL/L (ref 3.5–5.1)
Lab: 6.1 g/dL (ref 6.0–8.0)
Lab: 8.4 mg/dL — ABNORMAL LOW (ref >60)
Lab: 86 mg/dL (ref 70–100)
Lab: 9 (ref 3–12)
Lab: >60 mL/min (ref >60)
Lab: >60 mL/min (ref >60)

## 2018-04-20 MED ORDER — DEXAMETHASONE SODIUM PHOSPHATE 10 MG/ML IJ SOLN
10 mg | Freq: Once | INTRAVENOUS | 0 refills | Status: CP
Start: 2018-04-20 — End: ?
  Administered 2018-04-20: 14:00:00 10 mg via INTRAVENOUS

## 2018-04-20 MED ORDER — HEPARIN, PORCINE (PF) 100 UNIT/ML IV SYRG
500 [IU] | Freq: Once | 0 refills | Status: CP
Start: 2018-04-20 — End: ?

## 2018-04-20 MED ORDER — PACLITAXEL IVPB
80 mg/m2 | Freq: Once | INTRAVENOUS | 0 refills | Status: CP
Start: 2018-04-20 — End: ?
  Administered 2018-04-20 (×2): 139.2 mg via INTRAVENOUS

## 2018-04-20 MED ORDER — FAMOTIDINE (PF) 20 MG/2 ML IV SOLN
20 mg | Freq: Once | INTRAVENOUS | 0 refills | Status: CP
Start: 2018-04-20 — End: ?
  Administered 2018-04-20: 14:00:00 20 mg via INTRAVENOUS

## 2018-04-20 MED ORDER — DIPHENHYDRAMINE HCL 50 MG/ML IJ SOLN
50 mg | Freq: Once | INTRAVENOUS | 0 refills | Status: CP
Start: 2018-04-20 — End: ?
  Administered 2018-04-20: 14:00:00 50 mg via INTRAVENOUS

## 2018-04-21 ENCOUNTER — Encounter: Admit: 2018-04-21 | Discharge: 2018-04-21 | Payer: Commercial Managed Care - HMO

## 2018-04-22 IMAGING — CT CT ABD-PELV W/ CM
2 of 8 series · 7 of 46 positions shown, 8 images · IV contrast (ISOVUE)
Comparison: CT of the abdomen and pelvis 09/09/2016.

CLINICAL DATA: 51-year-old female with history of metastatic breast
cancer. Constipation. No bowel movement in the past 3 days. Nausea.

EXAM:
CT ABDOMEN AND PELVIS WITH CONTRAST
TECHNIQUE: Multidetector CT imaging of the abdomen and pelvis was performed
using the standard protocol following bolus administration of
intravenous contrast.
CONTRAST:  100mL B5WFYD-5GG IOPAMIDOL (B5WFYD-5GG) INJECTION 61%

[Series 2: abd/pel with · axial · 0.74mm/px · z∈[-347,-17]mm · 4 of 90 slices shown, 5 images]
[im 12/90  soft-tissue]
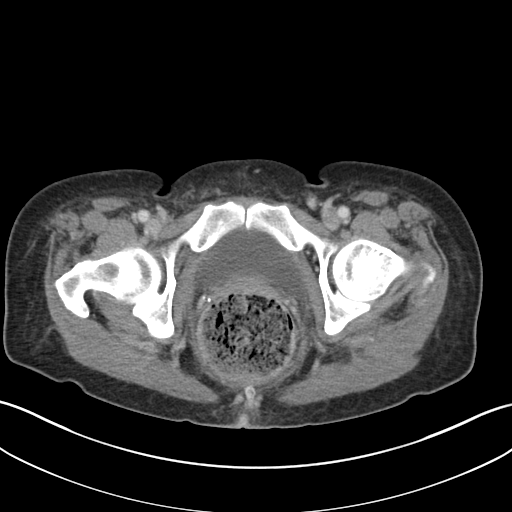
[im 12/90  bone]
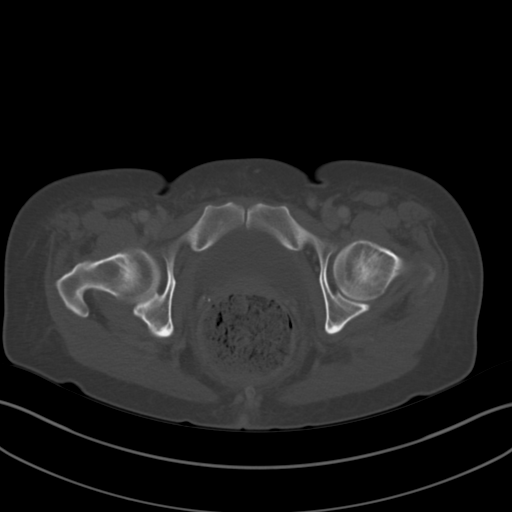
[im 36/90  soft-tissue]
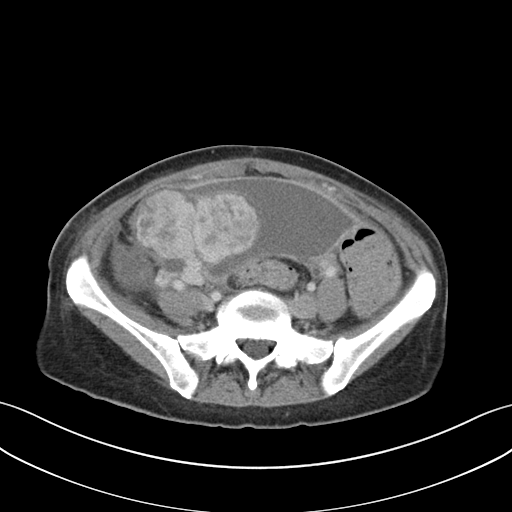
[im 54/90  soft-tissue]
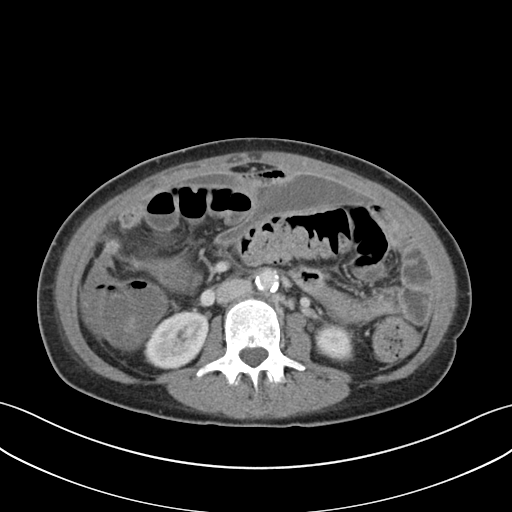
[im 78/90  soft-tissue]
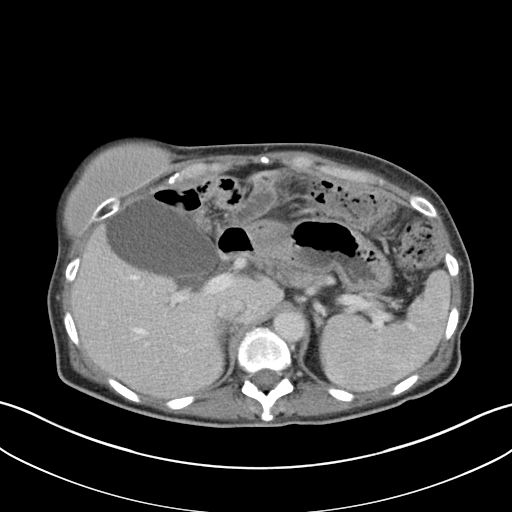

[Series 8: coronal a/|p · coronal · 0.19mm/px · 3 of 110 slices shown]
[im 28/110  soft-tissue]
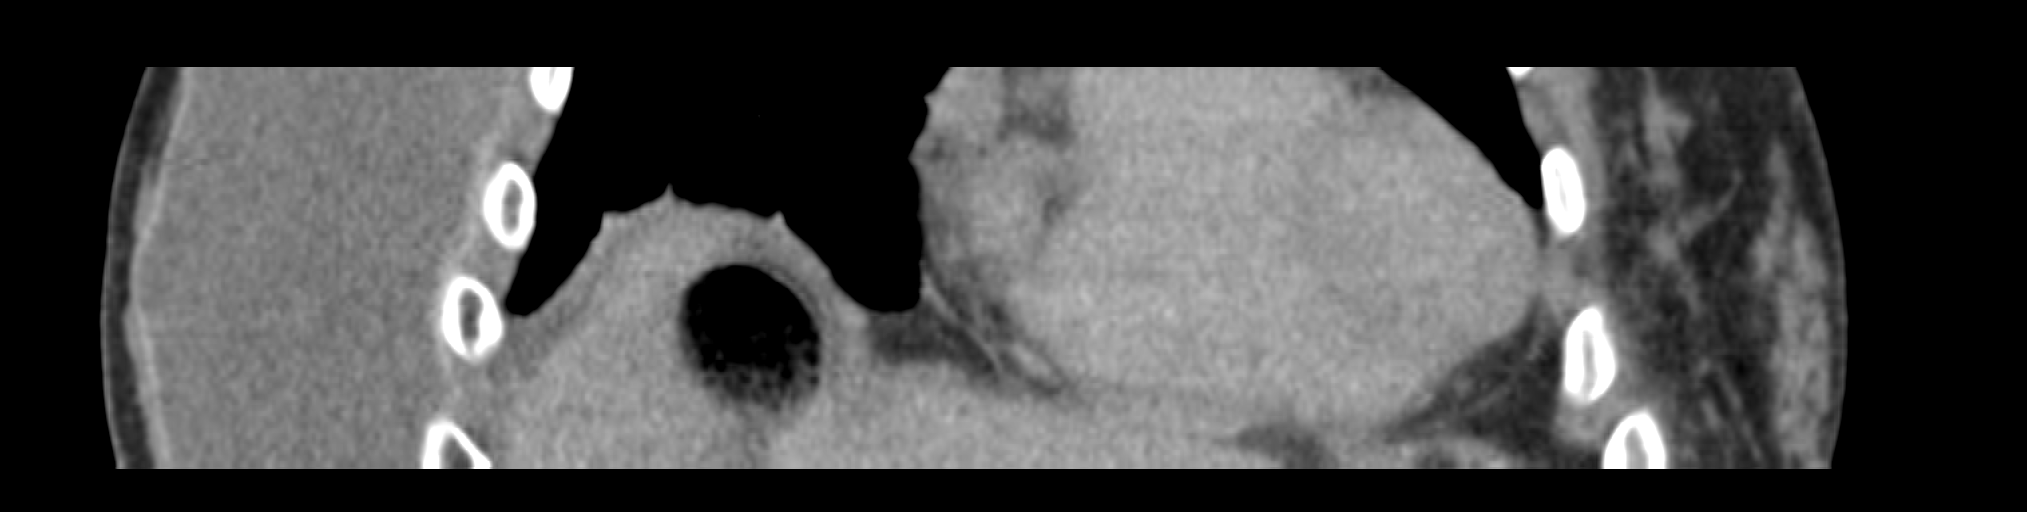
[im 55/110  soft-tissue]
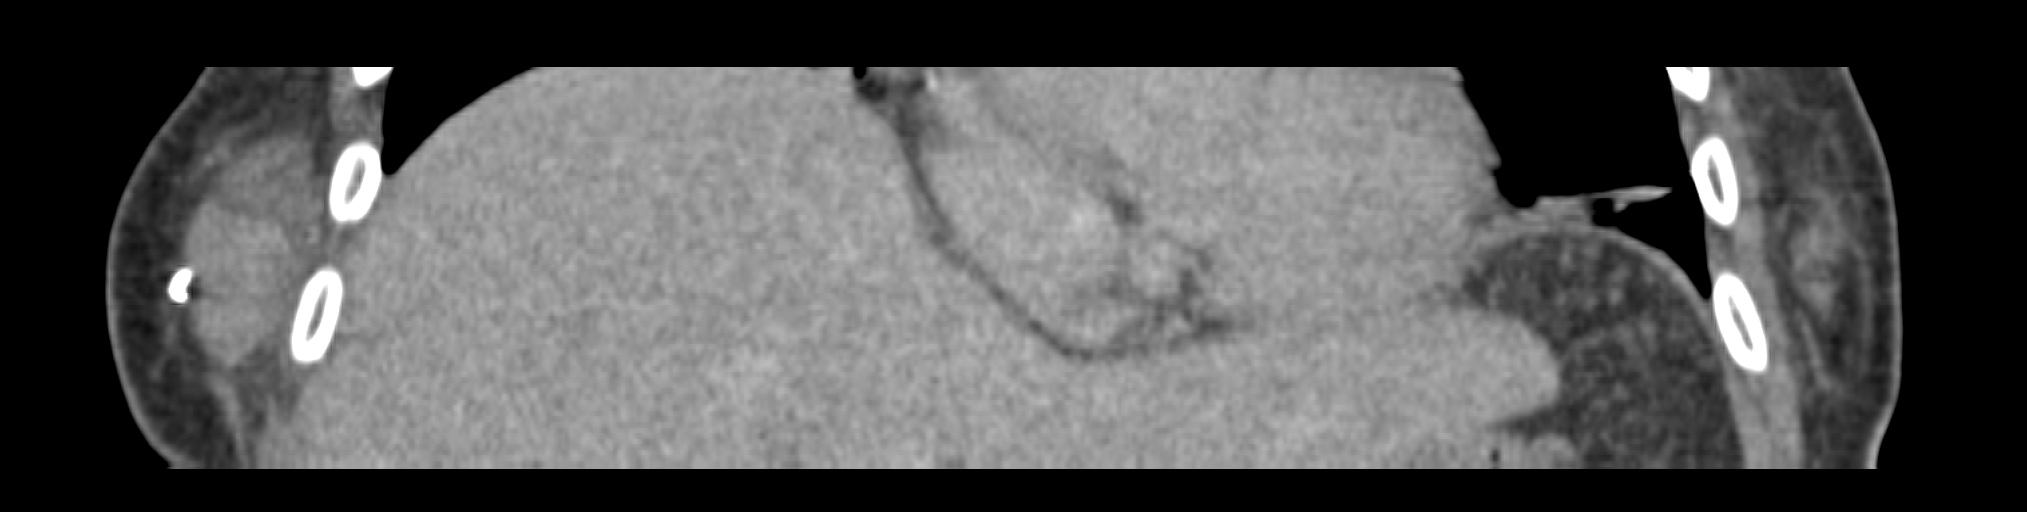
[im 82/110  soft-tissue]
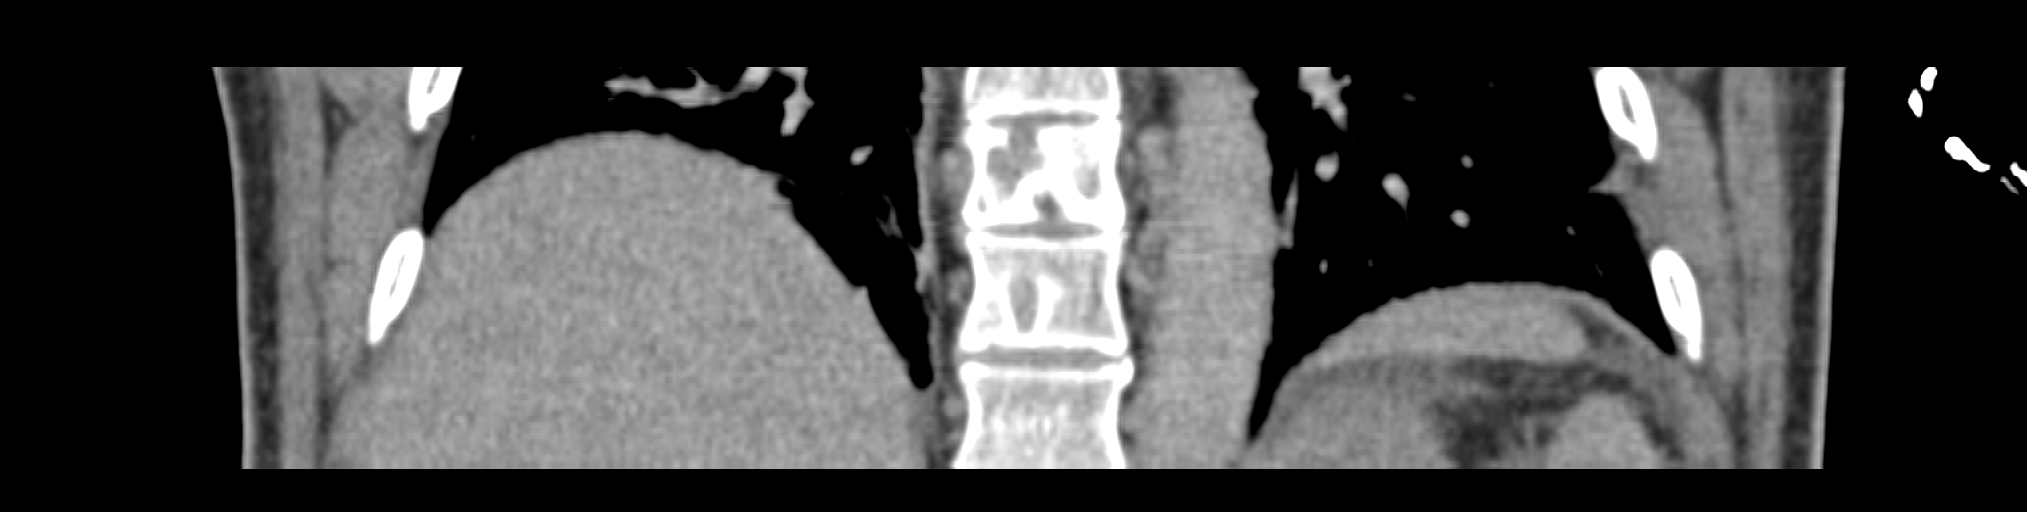

[7 of 46 positions shown; findings below may reference images not displayed]

FINDINGS: Lower chest: Status post right mastectomy with right-sided breast
implant incidentally noted. Tip of central venous catheter in the
right atrium. Trace right pleural effusion with some subsegmental
atelectasis or scarring in the right lower lobe.

Hepatobiliary: A few tiny subcentimeter low-attenuation lesions are
noted in the right lobe of the liver, too small to characterize. No
larger more suspicious appearing hepatic lesions are noted. No intra
or extrahepatic biliary ductal dilatation. Gallbladder is moderately
distended, but otherwise unremarkable in appearance.

Pancreas: No pancreatic mass. No pancreatic ductal dilatation. No
pancreatic or peripancreatic fluid or inflammatory changes.

Spleen: Unremarkable.

Adrenals/Urinary Tract: 4 mm nonobstructive calculus in the upper
pole collecting system of the right kidney. Left kidney is normal in
appearance. No hydroureteronephrosis. Urinary bladder is moderately
distended, but otherwise unremarkable in appearance. Large bilateral
adrenal lesions are again noted, most evident on the right side
where there is a 2.8 x 5.4 x 6.3 cm right adrenal mass (axial image
18 of series 2 and coronal image 73 of series 7) which appears to
potentially directly invade the adjacent inferior vena cava.

Stomach/Bowel: The appearance of the stomach is normal. No
pathologic dilatation of small bowel or colon. Moderate volume of
well-formed stool throughout the distal colon and rectum, compatible
with the reported clinical history of constipation. Appendix is not
confidently identified. There is again an unusual collection of gas
anterior to the liver best appreciated on axial image 3 of series 2
and coronal image 27 of series 7, which appears to arise from a loop
of small bowel, very similar to prior study 09/09/2016.

Vascular/Lymphatic: Aortic atherosclerosis, without evidence of
aneurysm or dissection in the abdominal or pelvic vasculature.
Multiple borderline enlarged retroperitoneal lymph nodes measuring
up to 7 mm in short axis are noted.

Reproductive: Uterus and left ovary are unremarkable in appearance.
Enhancing soft tissue mass is intimately associated with the right
adnexa, as detailed below, similar to the prior study.

Other: Large heterogeneously enhancing soft tissue masses in the
right lower quadrant intimately associated with the left adnexa. The
largest of these is medial measuring up to 5.6 x 5.8 cm, while the
smaller lateral lesion measures 5.2 x 4.6 cm (axial images 57 and 55
of series 2 respectively). Large volume of ascites with extensive
thickening and enhancement of the peritoneal membranes, with the
largest collection of this fluid in the anterior aspect of the
abdomen adjacent to the previously described enhancing soft tissue
lesions. Overall, the volume of ascites has decreased compared to
the prior study, but now appears more focally loculated. Previously
noted peritoneal drainage catheter has been removed. Other than the
collection of gas anterior to the liver which could conceivably
represent some loculated pneumoperitoneum, there is no gross
pneumoperitoneum elsewhere.

Musculoskeletal: Again noted are several mixed lytic and sclerotic
lesions throughout the visualized axial and appendicular skeleton,
with the largest of these again in the T8 vertebral body where there
is a pathologic compression fracture and approximately 20% loss of
anterior vertebral body height, similar to the prior study.
IMPRESSION: 1. Overall, although the volume of malignant ascites has decreased
compared to the prior study, and now appears focally loculated most
evident in the anterior aspect of the abdomen/pelvis, surrounded by
thickened and enhancing peritoneal membranes. This could simply be
secondary to malignancy, however, the possibility of infection of
this fluid following drainage with developing abscess is not
entirely excluded.
2. Persistent gas collection anterior to the liver, very similar in
appearance to the prior study with apparent communication with small
bowel. This is unusual in appearance, and the possibility of focally
contained perforated loop of distal small bowel remains of concern.
However, a large diverticulum of the small bowel could also be
considered, particularly in light of the stability of this finding.
3. Metastatic disease in the peritoneal cavity (intimately
associated with the right adnexa) with malignant ascites, metastatic
disease to the adrenal glands bilaterally (with potential invasion
of the inferior vena cava by the large right adrenal mass) and
widespread metastatic disease to the bones again noted. Tiny
subcentimeter low-attenuation lesions in the right lobe of the liver
are too small to definitively characterize, but could represent
additional sites of metastatic disease. Attention on followup
studies is recommended.
4. Aortic atherosclerosis.

## 2018-04-24 ENCOUNTER — Encounter: Admit: 2018-04-24 | Discharge: 2018-04-24 | Payer: Commercial Managed Care - HMO

## 2018-04-24 MED ORDER — SODIUM CHLORIDE 0.9 % IV SOLP
1000 mL | Freq: Once | INTRAVENOUS | 0 refills | Status: DC
Start: 2018-04-24 — End: 2018-04-24

## 2018-04-24 MED ORDER — SODIUM CHLORIDE 0.9 % IV SOLP
1000 mL | Freq: Once | INTRAVENOUS | 0 refills | Status: CN
Start: 2018-04-24 — End: ?

## 2018-04-24 MED ORDER — LORAZEPAM 0.5 MG PO TAB
1 | ORAL_TABLET | ORAL | 0 refills | 12.00000 days | Status: AC | PRN
Start: 2018-04-24 — End: 2018-05-09
  Filled 2018-04-25 (×2): qty 30, 8d supply, fill #1

## 2018-04-25 ENCOUNTER — Ambulatory Visit: Admit: 2018-04-25 | Discharge: 2018-04-25 | Payer: Commercial Managed Care - HMO

## 2018-04-25 ENCOUNTER — Encounter: Admit: 2018-04-25 | Discharge: 2018-04-25 | Payer: Commercial Managed Care - HMO

## 2018-04-25 DIAGNOSIS — C50919 Malignant neoplasm of unspecified site of unspecified female breast: Principal | ICD-10-CM

## 2018-04-25 DIAGNOSIS — C771 Secondary and unspecified malignant neoplasm of intrathoracic lymph nodes: ICD-10-CM

## 2018-04-25 DIAGNOSIS — C782 Secondary malignant neoplasm of pleura: ICD-10-CM

## 2018-04-25 DIAGNOSIS — C50911 Malignant neoplasm of unspecified site of right female breast: Principal | ICD-10-CM

## 2018-04-25 DIAGNOSIS — Z170 Estrogen receptor positive status [ER+]: ICD-10-CM

## 2018-04-25 DIAGNOSIS — C786 Secondary malignant neoplasm of retroperitoneum and peritoneum: ICD-10-CM

## 2018-04-25 DIAGNOSIS — Z9011 Acquired absence of right breast and nipple: ICD-10-CM

## 2018-04-25 DIAGNOSIS — R112 Nausea with vomiting, unspecified: Secondary | ICD-10-CM

## 2018-04-25 DIAGNOSIS — C787 Secondary malignant neoplasm of liver and intrahepatic bile duct: ICD-10-CM

## 2018-04-25 DIAGNOSIS — C7900 Secondary malignant neoplasm of unspecified kidney and renal pelvis: ICD-10-CM

## 2018-04-25 DIAGNOSIS — C7971 Secondary malignant neoplasm of right adrenal gland: ICD-10-CM

## 2018-04-25 DIAGNOSIS — C7951 Secondary malignant neoplasm of bone: ICD-10-CM

## 2018-04-25 DIAGNOSIS — Z79818 Long term (current) use of other agents affecting estrogen receptors and estrogen levels: ICD-10-CM

## 2018-04-25 LAB — COMPREHENSIVE METABOLIC PANEL
Lab: 139 MMOL/L (ref 137–147)
Lab: 4 MMOL/L (ref 3.5–5.1)

## 2018-04-25 LAB — CBC AND DIFF
Lab: 0 % (ref >60)
Lab: 0 10*3/uL (ref 0–0.20)
Lab: 0 10*3/uL (ref 0–0.45)
Lab: 0.2 10*3/uL (ref 0–0.80)
Lab: 0.4 10*3/uL — ABNORMAL LOW (ref 1.0–4.8)
Lab: 1 % (ref >60)
Lab: 1.9 10*3/uL (ref 1.8–7.0)
Lab: 2.5 10*3/uL — ABNORMAL LOW (ref 4.5–11.0)
Lab: 2.9 M/UL — ABNORMAL LOW (ref 4.0–5.0)
Lab: 20 % — ABNORMAL HIGH (ref 11–15)
Lab: 32 g/dL (ref 32.0–36.0)
Lab: 7 % (ref 4–12)
Lab: 75 % (ref 41–77)
Lab: 89 FL — ABNORMAL LOW (ref 80–100)

## 2018-04-25 MED ORDER — SODIUM CHLORIDE 0.9 % IJ SOLN
50 mL | Freq: Once | INTRAVENOUS | 0 refills | Status: CP
Start: 2018-04-25 — End: ?
  Administered 2018-04-25: 16:00:00 50 mL via INTRAVENOUS

## 2018-04-25 MED ORDER — IOHEXOL 350 MG IODINE/ML IV SOLN
60 mL | Freq: Once | INTRAVENOUS | 0 refills | Status: CP
Start: 2018-04-25 — End: ?
  Administered 2018-04-25: 16:00:00 60 mL via INTRAVENOUS

## 2018-04-25 MED ORDER — HEPARIN, PORCINE (PF) 100 UNIT/ML IV SYRG
500 [IU] | Freq: Once | 0 refills | Status: CP
Start: 2018-04-25 — End: ?

## 2018-04-25 MED ORDER — SODIUM CHLORIDE 0.9 % IV SOLP
1000 mL | Freq: Once | INTRAVENOUS | 0 refills | Status: CP
Start: 2018-04-25 — End: ?
  Administered 2018-04-25: 18:00:00 1000 mL via INTRAVENOUS

## 2018-04-25 MED ADMIN — HEPARIN, PORCINE (PF) 100 UNIT/ML IV SYRG [17362]: INTRAVENOUS | @ 20:00:00 | Stop: 2018-04-25 | NDC 08290306515

## 2018-04-26 ENCOUNTER — Encounter: Admit: 2018-04-26 | Discharge: 2018-04-26 | Payer: Commercial Managed Care - HMO

## 2018-04-26 MED ORDER — PACLITAXEL IVPB
80 mg/m2 | Freq: Once | INTRAVENOUS | 0 refills | Status: CN
Start: 2018-04-26 — End: ?

## 2018-04-27 ENCOUNTER — Encounter: Admit: 2018-04-27 | Discharge: 2018-04-27 | Payer: Commercial Managed Care - HMO

## 2018-04-27 DIAGNOSIS — C50919 Malignant neoplasm of unspecified site of unspecified female breast: ICD-10-CM

## 2018-04-27 DIAGNOSIS — C771 Secondary and unspecified malignant neoplasm of intrathoracic lymph nodes: ICD-10-CM

## 2018-04-27 DIAGNOSIS — C782 Secondary malignant neoplasm of pleura: ICD-10-CM

## 2018-04-27 DIAGNOSIS — Z79818 Long term (current) use of other agents affecting estrogen receptors and estrogen levels: ICD-10-CM

## 2018-04-27 DIAGNOSIS — C7900 Secondary malignant neoplasm of unspecified kidney and renal pelvis: ICD-10-CM

## 2018-04-27 DIAGNOSIS — C787 Secondary malignant neoplasm of liver and intrahepatic bile duct: ICD-10-CM

## 2018-04-27 DIAGNOSIS — Z170 Estrogen receptor positive status [ER+]: ICD-10-CM

## 2018-04-27 DIAGNOSIS — C7971 Secondary malignant neoplasm of right adrenal gland: ICD-10-CM

## 2018-04-27 DIAGNOSIS — C786 Secondary malignant neoplasm of retroperitoneum and peritoneum: ICD-10-CM

## 2018-04-27 DIAGNOSIS — R52 Pain, unspecified: ICD-10-CM

## 2018-04-27 DIAGNOSIS — C50911 Malignant neoplasm of unspecified site of right female breast: Principal | ICD-10-CM

## 2018-04-27 DIAGNOSIS — C7951 Secondary malignant neoplasm of bone: ICD-10-CM

## 2018-04-27 DIAGNOSIS — Z9011 Acquired absence of right breast and nipple: ICD-10-CM

## 2018-04-27 LAB — CORTISOL,RANDOM: Lab: 13 ug/dL (ref 5.0–20.0)

## 2018-04-27 MED ORDER — PACLITAXEL IVPB
80 mg/m2 | Freq: Once | INTRAVENOUS | 0 refills | Status: CP
Start: 2018-04-27 — End: ?
  Administered 2018-04-27 (×2): 139.2 mg via INTRAVENOUS

## 2018-04-27 MED ORDER — DIPHENHYDRAMINE HCL 50 MG/ML IJ SOLN
50 mg | Freq: Once | INTRAVENOUS | 0 refills | Status: CP
Start: 2018-04-27 — End: ?
  Administered 2018-04-27: 20:00:00 50 mg via INTRAVENOUS

## 2018-04-27 MED ORDER — FAMOTIDINE (PF) 20 MG/2 ML IV SOLN
20 mg | Freq: Once | INTRAVENOUS | 0 refills | Status: CP
Start: 2018-04-27 — End: ?
  Administered 2018-04-27: 19:00:00 20 mg via INTRAVENOUS

## 2018-04-27 MED ORDER — DEXAMETHASONE SODIUM PHOSPHATE 10 MG/ML IJ SOLN
10 mg | Freq: Once | INTRAVENOUS | 0 refills | Status: CP
Start: 2018-04-27 — End: ?
  Administered 2018-04-27: 19:00:00 10 mg via INTRAVENOUS

## 2018-04-27 MED ORDER — FAMOTIDINE (PF) 20 MG/2 ML IV SOLN
20 mg | Freq: Once | INTRAVENOUS | 0 refills | Status: CN
Start: 2018-04-27 — End: ?

## 2018-04-27 MED ORDER — DIPHENHYDRAMINE HCL 50 MG/ML IJ SOLN
50 mg | Freq: Once | INTRAVENOUS | 0 refills | Status: CN
Start: 2018-04-27 — End: ?

## 2018-04-27 MED ORDER — HEPARIN, PORCINE (PF) 100 UNIT/ML IV SYRG
500 [IU] | Freq: Once | 0 refills | Status: CP
Start: 2018-04-27 — End: ?

## 2018-04-27 MED ORDER — PACLITAXEL IVPB
80 mg/m2 | Freq: Once | INTRAVENOUS | 0 refills | Status: CN
Start: 2018-04-27 — End: ?

## 2018-04-27 NOTE — Progress Notes
Name: Donna Tyler          MRN: [id number]      DOB: [birth year]      AGE: 53 y.o.   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 female diagnosed with metastatic breast cancer. She had a right mastectomy/ALND 03/14/14. Pathology revealed 3 foci of grade II invasive ductal carcinoma measuring 4.4 cm, 1.1 cm and 1 mm and 10/15 positive LN. (ER 95%, PR 40-50%, Her2 FISH 1.3). She received adjuvant A/C, Taxol, radiation and took Tamoxifen for 2 years (until 05/2016).     Unfortunately in June 2018 she had abdominal distention and was noted to have ascites. Paracentesis 07/15/16 was negative for malignant cells. CT guided pelvic mass 07/19/16 revealed metastatic carcinoma (ER 100%, PR 31%, Her2 2+, FISH 1.04, Ki-67: 48%). Imaging reports from this time are unavailable. She started Faslodex/Ibrance 07/2016 and it was discontinued 09/2017 for progressive disease (increased aortocaval lymph node and slight increase in cystic mass in right lower quadrant).     Treatment was witched to exemestane and everolimus 10/2017-01/2018). 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     03/14/18 EBUS of 4R lymph node FNA showed metastatic breast cancer, ER 98% positive, PR 5% positive, HER-2 0+, Ki-67 36%. Right thoracentesis 03/18/18 revealed highly atypical cells, consistent with metastatic carcinoma.     [name] transferred 03/2018 due to Dr. Tiburcio Pea' phase out of medical oncology.     PRESENT THERAPY: Taxol (04/07/18)    INTERVAL HISTORY: Donna Tyler presents for taxol week 4. She reports some Appearance: She is not ill-appearing.   HENT:      Head: Normocephalic.   Eyes:      Pupils: Pupils are equal, round, and reactive to light.   Neck:      Musculoskeletal: Normal range of motion.   Cardiovascular:      Rate and Rhythm: Tachycardia present.      Heart sounds: No murmur.   Pulmonary:      Effort: Pulmonary effort is normal. No respiratory distress.      Breath sounds: Examination of the right-middle field reveals decreased breath sounds. Examination of the right-lower field reveals decreased breath sounds. Examination of the left-lower field reveals decreased breath sounds. Decreased breath sounds present. No rhonchi.   Chest:       Abdominal:      General: There is no distension.      Palpations: There is mass.      Tenderness: There is no abdominal tenderness.      Comments: Palpable fullness/masss in the epigastric area   Musculoskeletal:         General: Swelling (BLE 2+ up to the pelvis, improved some since intial visit) present. No tenderness.   Lymphadenopathy:      Cervical: No cervical adenopathy.      Upper Body:      Right upper body: No axillary adenopathy.      Left upper body: No axillary adenopathy.   Skin:  General: Skin is warm.      Coloration: Skin is not jaundiced.      Findings: No erythema.   Neurological:      General: No focal deficit present.      Mental Status: She is easily aroused. Mental status is at baseline. She is lethargic.      Cranial Nerves: No cranial nerve deficit.      Sensory: No sensory deficit.      Motor: No weakness.      Coordination: Coordination normal.   Psychiatric:         Mood and Affect: Mood normal.         Thought Content: Thought content normal.          CBC w diff    Lab Results   Component Value Date/Time    WBC 2.5 (L) 04/25/2018 11:28 AM    RBC 2.92 (L) 04/25/2018 11:28 AM    HGB 8.5 (L) 04/25/2018 11:28 AM    HCT 26.0 (L) 04/25/2018 11:28 AM    MCV 89.0 04/25/2018 11:28 AM    MCH 29.2 04/25/2018 11:28 AM

## 2018-04-28 ENCOUNTER — Encounter: Admit: 2018-04-28 | Discharge: 2018-04-28 | Payer: Commercial Managed Care - HMO

## 2018-04-28 DIAGNOSIS — R7989 Other specified abnormal findings of blood chemistry: ICD-10-CM

## 2018-04-28 DIAGNOSIS — C50919 Malignant neoplasm of unspecified site of unspecified female breast: Principal | ICD-10-CM

## 2018-05-01 ENCOUNTER — Encounter: Admit: 2018-05-01 | Discharge: 2018-05-01 | Payer: Commercial Managed Care - HMO

## 2018-05-04 ENCOUNTER — Encounter: Admit: 2018-05-04 | Discharge: 2018-05-05 | Payer: Commercial Managed Care - HMO

## 2018-05-04 ENCOUNTER — Encounter: Admit: 2018-05-04 | Discharge: 2018-05-04 | Payer: Commercial Managed Care - HMO

## 2018-05-04 DIAGNOSIS — Z170 Estrogen receptor positive status [ER+]: ICD-10-CM

## 2018-05-04 DIAGNOSIS — C786 Secondary malignant neoplasm of retroperitoneum and peritoneum: ICD-10-CM

## 2018-05-04 DIAGNOSIS — C782 Secondary malignant neoplasm of pleura: ICD-10-CM

## 2018-05-04 DIAGNOSIS — C7900 Secondary malignant neoplasm of unspecified kidney and renal pelvis: ICD-10-CM

## 2018-05-04 DIAGNOSIS — Z9011 Acquired absence of right breast and nipple: ICD-10-CM

## 2018-05-04 DIAGNOSIS — Z79818 Long term (current) use of other agents affecting estrogen receptors and estrogen levels: ICD-10-CM

## 2018-05-04 DIAGNOSIS — C771 Secondary and unspecified malignant neoplasm of intrathoracic lymph nodes: ICD-10-CM

## 2018-05-04 DIAGNOSIS — C50919 Malignant neoplasm of unspecified site of unspecified female breast: ICD-10-CM

## 2018-05-04 DIAGNOSIS — C50911 Malignant neoplasm of unspecified site of right female breast: Principal | ICD-10-CM

## 2018-05-04 DIAGNOSIS — C787 Secondary malignant neoplasm of liver and intrahepatic bile duct: ICD-10-CM

## 2018-05-04 DIAGNOSIS — C7971 Secondary malignant neoplasm of right adrenal gland: ICD-10-CM

## 2018-05-04 DIAGNOSIS — C7951 Secondary malignant neoplasm of bone: ICD-10-CM

## 2018-05-04 LAB — COMPREHENSIVE METABOLIC PANEL
Lab: 217 U/L — ABNORMAL HIGH (ref 25–110)
Lab: 25 MMOL/L (ref 21–30)
Lab: 3.2 g/dL — ABNORMAL LOW (ref 3.5–5.0)
Lab: 38 U/L — ABNORMAL HIGH (ref 7–40)
Lab: 59 mL/min — ABNORMAL LOW (ref >60)
Lab: 7 10*3/uL — ABNORMAL LOW (ref 3–12)
Lab: 9 U/L (ref 7–56)
Lab: >60 mL/min (ref >60)

## 2018-05-04 LAB — CBC AND DIFF
Lab: 0 10*3/uL (ref 0–0.20)
Lab: 0 10*3/uL (ref 0–0.45)

## 2018-05-04 LAB — CORTISOL-AM: Lab: 15 ug/dL — ABNORMAL HIGH (ref 6.7–22.6)

## 2018-05-04 MED ORDER — PACLITAXEL IVPB
80 mg/m2 | Freq: Once | INTRAVENOUS | 0 refills | Status: CP
Start: 2018-05-04 — End: ?
  Administered 2018-05-04 (×2): 139.2 mg via INTRAVENOUS

## 2018-05-04 MED ORDER — DIPHENHYDRAMINE HCL 50 MG/ML IJ SOLN
50 mg | Freq: Once | INTRAVENOUS | 0 refills | Status: CP
Start: 2018-05-04 — End: ?
  Administered 2018-05-04: 14:00:00 50 mg via INTRAVENOUS

## 2018-05-04 MED ORDER — POTASSIUM CHLORIDE 20 MEQ PO TBTQ
40 meq | Freq: Once | ORAL | 0 refills | Status: CP
Start: 2018-05-04 — End: ?
  Administered 2018-05-04: 14:00:00 40 meq via ORAL

## 2018-05-04 MED ORDER — DENOSUMAB 120 MG/1.7 ML (70 MG/ML) SC SOLN
120 mg | Freq: Once | SUBCUTANEOUS | 0 refills | Status: DC
Start: 2018-05-04 — End: 2018-05-04

## 2018-05-04 MED ORDER — DENOSUMAB 120 MG/1.7 ML (70 MG/ML) SC SOLN
120 mg | Freq: Once | SUBCUTANEOUS | 0 refills | Status: CN
Start: 2018-05-04 — End: ?

## 2018-05-04 MED ORDER — DEXAMETHASONE SODIUM PHOSPHATE 10 MG/ML IJ SOLN
10 mg | Freq: Once | INTRAVENOUS | 0 refills | Status: CP
Start: 2018-05-04 — End: ?
  Administered 2018-05-04: 14:00:00 10 mg via INTRAVENOUS

## 2018-05-04 MED ORDER — HEPARIN, PORCINE (PF) 100 UNIT/ML IV SYRG
500 [IU] | Freq: Once | 0 refills | Status: CP
Start: 2018-05-04 — End: ?

## 2018-05-04 MED ORDER — FAMOTIDINE (PF) 20 MG/2 ML IV SOLN
20 mg | Freq: Once | INTRAVENOUS | 0 refills | Status: CP
Start: 2018-05-04 — End: ?
  Administered 2018-05-04: 14:00:00 20 mg via INTRAVENOUS

## 2018-05-04 NOTE — Progress Notes
.  CHEMO NOTE  Verified chemo consent signed and in chart.    Verified initiate chemo order in O2    Blood return positive via: Port (Single)    BSA and dose double checked (agree with orders as written) with: yes see mar    Labs/applicable tests checked: CBC and Comprehensive Metabolic Panel (CMP)    Chemo regime: Drug/cycle/day   PACLitaxeL (TAXOL) 139.2 mg in sodium chloride 0.9% (NS) 273.2 mL IVPB (non-PVC)   [[id number]]     Rate erified and armband double checkwith second RN: yes    Patient education offered and stated understanding. Denies questions at this time.    Pt arrived to cc tx for taxol. Pt states no pertinent changes upon assessment. Pre meds given per tx plan. PAC flushed and positive for blood return. Pt tolerated infusion with no known adverse events. PAC flushed, positive for blood return and deaccessed per protocol. Pt left ambulatory in stable condition and denied any further needs.

## 2018-05-08 ENCOUNTER — Encounter: Admit: 2018-05-08 | Discharge: 2018-05-08 | Payer: Commercial Managed Care - HMO

## 2018-05-08 MED FILL — PROCHLORPERAZINE MALEATE 10 MG PO TAB: 10 mg | ORAL | 8 days supply | Qty: 30 | Fill #2 | Status: AC

## 2018-05-08 MED FILL — ONDANSETRON HCL 4 MG PO TAB: 4 mg | ORAL | 10 days supply | Qty: 30 | Fill #2 | Status: AC

## 2018-05-08 MED FILL — SERTRALINE 100 MG PO TAB: 100 mg | ORAL | 30 days supply | Qty: 60 | Fill #2 | Status: AC

## 2018-05-09 ENCOUNTER — Encounter: Admit: 2018-05-09 | Discharge: 2018-05-09 | Payer: Commercial Managed Care - HMO

## 2018-05-09 MED ORDER — LIDOCAINE-PRILOCAINE 2.5-2.5 % TP CREA
TOPICAL | 0 refills | 30.00000 days | Status: DC
Start: 2018-05-09 — End: 2018-05-22
  Filled 2018-05-11 (×2): qty 30, 30d supply, fill #1

## 2018-05-09 MED ORDER — LORAZEPAM 0.5 MG PO TAB
1 | ORAL_TABLET | ORAL | 0 refills | 12.00000 days | Status: DC | PRN
Start: 2018-05-09 — End: 2018-05-22
  Filled 2018-05-11 (×2): qty 30, 8d supply, fill #1

## 2018-05-09 MED ORDER — LORAZEPAM 0.5 MG PO TAB
1 | ORAL_TABLET | ORAL | 0 refills | PRN
Start: 2018-05-09 — End: ?

## 2018-05-09 MED ORDER — LIDOCAINE-PRILOCAINE 2.5-2.5 % TP CREA
0 refills
Start: 2018-05-09 — End: ?

## 2018-05-11 ENCOUNTER — Encounter: Admit: 2018-05-11 | Discharge: 2018-05-11 | Payer: Commercial Managed Care - HMO

## 2018-05-11 ENCOUNTER — Encounter: Admit: 2018-05-11 | Discharge: 2018-05-12 | Payer: Commercial Managed Care - HMO

## 2018-05-11 DIAGNOSIS — C50919 Malignant neoplasm of unspecified site of unspecified female breast: ICD-10-CM

## 2018-05-11 DIAGNOSIS — I2699 Other pulmonary embolism without acute cor pulmonale: ICD-10-CM

## 2018-05-11 DIAGNOSIS — Z170 Estrogen receptor positive status [ER+]: ICD-10-CM

## 2018-05-11 DIAGNOSIS — R0902 Hypoxemia: ICD-10-CM

## 2018-05-11 DIAGNOSIS — C782 Secondary malignant neoplasm of pleura: ICD-10-CM

## 2018-05-11 DIAGNOSIS — C7900 Secondary malignant neoplasm of unspecified kidney and renal pelvis: ICD-10-CM

## 2018-05-11 DIAGNOSIS — C7931 Secondary malignant neoplasm of brain: ICD-10-CM

## 2018-05-11 DIAGNOSIS — C771 Secondary and unspecified malignant neoplasm of intrathoracic lymph nodes: ICD-10-CM

## 2018-05-11 DIAGNOSIS — C786 Secondary malignant neoplasm of retroperitoneum and peritoneum: ICD-10-CM

## 2018-05-11 DIAGNOSIS — C50911 Malignant neoplasm of unspecified site of right female breast: Principal | ICD-10-CM

## 2018-05-11 DIAGNOSIS — C787 Secondary malignant neoplasm of liver and intrahepatic bile duct: ICD-10-CM

## 2018-05-11 DIAGNOSIS — C7971 Secondary malignant neoplasm of right adrenal gland: ICD-10-CM

## 2018-05-11 DIAGNOSIS — Z9011 Acquired absence of right breast and nipple: ICD-10-CM

## 2018-05-11 DIAGNOSIS — F329 Major depressive disorder, single episode, unspecified: ICD-10-CM

## 2018-05-11 DIAGNOSIS — Z79818 Long term (current) use of other agents affecting estrogen receptors and estrogen levels: ICD-10-CM

## 2018-05-11 DIAGNOSIS — Z789 Other specified health status: ICD-10-CM

## 2018-05-11 DIAGNOSIS — C7951 Secondary malignant neoplasm of bone: ICD-10-CM

## 2018-05-11 DIAGNOSIS — K5900 Constipation, unspecified: ICD-10-CM

## 2018-05-11 DIAGNOSIS — Z87311 Personal history of (healed) other pathological fracture: ICD-10-CM

## 2018-05-11 DIAGNOSIS — K652 Spontaneous bacterial peritonitis: ICD-10-CM

## 2018-05-11 DIAGNOSIS — G8929 Other chronic pain: ICD-10-CM

## 2018-05-11 LAB — COMPREHENSIVE METABOLIC PANEL
Lab: 0.3 mg/dL (ref 0.3–1.2)
Lab: 0.9 mg/dL (ref 0.4–1.00)
Lab: 106 MMOL/L — ABNORMAL LOW (ref 98–110)
Lab: 117 mg/dL — ABNORMAL HIGH (ref 70–100)
Lab: 139 MMOL/L — ABNORMAL LOW (ref 137–147)
Lab: 3.2 g/dL — ABNORMAL LOW (ref 3.5–5.0)
Lab: 5.9 g/dL — ABNORMAL LOW (ref 6.0–8.0)
Lab: 8.5 mg/dL — ABNORMAL HIGH (ref 8.5–10.6)
Lab: 9 mg/dL (ref 7–25)

## 2018-05-11 LAB — CBC AND DIFF: Lab: 3.5 10*3/uL — ABNORMAL LOW (ref 4.5–11.0)

## 2018-05-11 MED ORDER — MORPHINE 60 MG PO TBER
ORAL_TABLET | Freq: Three times a day (TID) | ORAL | 0 refills | 7.00000 days | Status: DC
Start: 2018-05-11 — End: 2018-05-22
  Filled 2018-05-11 (×2): qty 90, 30d supply, fill #1

## 2018-05-11 MED ORDER — RIVAROXABAN 15 MG (42)- 20 MG (9) PO DSPK
ORAL_TABLET | Freq: Two times a day (BID) | ORAL | 0 refills | 30.00000 days | Status: AC
Start: 2018-05-11 — End: ?
  Filled 2018-05-11 (×2): qty 51, 28d supply, fill #1

## 2018-05-11 MED ORDER — DIAZEPAM 5 MG PO TAB
ORAL_TABLET | ORAL | 0 refills | 7.00000 days | Status: DC | PRN
Start: 2018-05-11 — End: 2018-05-22
  Filled 2018-05-11 (×2): qty 5, 2d supply, fill #1

## 2018-05-11 MED ORDER — IOHEXOL 350 MG IODINE/ML IV SOLN
80 mL | Freq: Once | INTRAVENOUS | 0 refills | Status: CP
Start: 2018-05-11 — End: ?
  Administered 2018-05-11: 21:00:00 80 mL via INTRAVENOUS

## 2018-05-11 MED ORDER — SODIUM CHLORIDE 0.9 % IJ SOLN
50 mL | Freq: Once | INTRAVENOUS | 0 refills | Status: CP
Start: 2018-05-11 — End: ?
  Administered 2018-05-11: 21:00:00 50 mL via INTRAVENOUS

## 2018-05-11 NOTE — Progress Notes
Name: Donna Tyler          MRN: [id number]      DOB: November 23, 1965      AGE: 53 y.o.   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 female diagnosed with metastatic breast cancer. She had a right mastectomy/ALND 03/14/14. Pathology revealed 3 foci of grade II invasive ductal carcinoma measuring 4.4 cm, 1.1 cm and 1 mm and 10/15 positive LN. (ER 95%, PR 40-50%, Her2 FISH 1.3). She received adjuvant A/C, Taxol, radiation and took Tamoxifen for 2 years (until 05/2016).     Unfortunately in June 2018 she had abdominal distention and was noted to have ascites. Paracentesis 07/15/16 was negative for malignant cells. CT guided pelvic mass 07/19/16 revealed metastatic carcinoma (ER 100%, PR 31%, Her2 2+, FISH 1.04, Ki-67: 48%). Imaging reports from this time are unavailable. She started Faslodex/Ibrance 07/2016 and it was discontinued 09/2017 for progressive disease (increased aortocaval lymph node and slight increase in cystic mass in right lower quadrant).     Treatment was witched to exemestane and everolimus 10/2017-01/2018). 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     03/14/18 EBUS of 4R lymph node FNA showed metastatic breast cancer, ER 98% positive, PR 5% positive, HER-2 0+, Ki-67 36%. Right thoracentesis 03/18/18 revealed highly atypical cells, consistent with metastatic carcinoma.     Donna Tyler transferred 03/2018 due to Dr. Tiburcio Pea' phase out of medical oncology.     PRESENT THERAPY: Taxol (04/07/18)    INTERVAL HISTORY: Donna Tyler presented to treatment today for week 6 Taxol. She reports worsening dyspnea and edema.         Review of Systems   Constitutional: Positive for fatigue (sleeping 20 hours/day for the last 3 days). Negative for fever.   HENT: Negative.    Eyes: Negative for visual disturbance.   Respiratory: Positive for shortness of breath (improved, uses O2 at night). Negative for cough.    Cardiovascular: Positive for leg swelling (worsening). Negative for chest pain.   Gastrointestinal: Positive for constipation (controlled with stool softeners), nausea (intermittent) and vomiting (twice in the last week). Negative for abdominal distention, abdominal pain and diarrhea.   Genitourinary: Negative.  Negative for difficulty urinating.   Musculoskeletal: Negative for back pain.   Skin: Negative for rash.   Neurological: Positive for weakness (fell in the shower ). Negative for dizziness, numbness and headaches.   Hematological: Negative for adenopathy.   Psychiatric/Behavioral: Negative for dysphoric mood. The patient is not nervous/anxious.          PMH: Negative  FAMILY HX:  No family history of breast or ovarian or colon cancer   SOCIAL HX: former smoker x30 years.  Quit in 2010. 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 No HRT. Post menopausal     Objective:         ??? calcium carbonate/vitamin D-3 (OS-CAL 500+D) 1250 mg/200 unit tablet Take one tablet by mouth twice daily with meals. Calcium Carb 1250mg  delivers 500mg   elemental Ca   ??? diazePAM (VALIUM) 5 mg tablet Take 2 tabs 1 hour prior to CT. Take another 5mg  as needed.   ??? diphenhydramine HCl (BENADRYL ALLERGY PO) Take 50 mg by mouth at bedtime daily.   ??? lidocaine/prilocaine (EMLA) 2.5/2.5 % topical cream Apply a half dollar size amount to port site 30-60 min prior to accessing. Cover with [employer] or non-adhesive dressing.   ??? LORazepam (ATIVAN) 0.5 mg tablet Take one tablet by mouth every 6 hours as needed for Nausea. General: She is not in acute distress.     Appearance: She is not ill-appearing.   HENT:      Head: Normocephalic.   Eyes:      Pupils: Pupils are equal, round, and reactive to light.   Neck:      Musculoskeletal: Normal range of motion.   Cardiovascular:      Rate and Rhythm: Tachycardia present.      Heart sounds: No murmur.   Pulmonary:      Effort: Pulmonary effort is normal. No respiratory distress.      Breath sounds: Examination of the right-middle field reveals decreased breath sounds. Examination of the right-lower field reveals decreased breath sounds. Examination of the left-lower field reveals decreased breath sounds. Decreased breath sounds present. No rhonchi.   Chest:       Abdominal:      General: There is no distension.      Palpations: There is mass.      Tenderness: There is no abdominal tenderness.      Comments: Palpable fullness/masss in the epigastric area   Musculoskeletal:         General: Swelling (BLE 2+ up to the pelvis) present. No tenderness.   Lymphadenopathy:      Cervical: No cervical adenopathy.      Upper Body:      Right upper body: No axillary adenopathy.      Left upper body: No axillary adenopathy.   Skin:     General: Skin is warm.      Coloration: Skin is not jaundiced.      Findings: No erythema.   Neurological:      General: No focal deficit present.      Mental Status: She is oriented to person, place, and time and easily aroused. Mental status is at baseline. She is lethargic.      Sensory: No sensory deficit.      Motor: No weakness.      Comments: Somnolent in clinic   Psychiatric:         Mood and Affect: Mood normal.         Thought Content: Thought content normal.          CBC w diff    Lab Results   Component Value Date/Time    WBC 3.5 (L) 05/11/2018 02:52 PM    RBC 2.86 (L) 05/11/2018 02:52 PM    HGB 8.5 (L) 05/11/2018 02:52 PM    HCT 26.1 (L) 05/11/2018 02:52 PM    MCV 91.0 05/11/2018 02:52 PM    MCH 29.6 05/11/2018 02:52 PM    MCHC 32.5 05/11/2018 02:52 PM is able to maintain O2 sats >90% on Room air at rest.  She has O2 at home available.  She will use the O2 at all times (2 lit) at home at this time. Will set up home health to provide pulse ox and ensure adequate O2.     4. 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     5. Genetic Testing: Patients insurance requires her to meet with a Dentist.  Will hold off on testing at this time.     6. Generalized pain, related to malignancy. Reports good control of pain at this time. She appears over sedated from narcotics. She recently ran out of MS contin 15mg  and has had good control with MS Contin 60mg .    Will proceed with MS Contin 60mg  bid at this time. Also has Morphine IR 15mg -30mg  q4hrs PRN, although is not really needing it.     5. Significant lower extremity edema, most likely from encacement and severe narrowing of suprahepatic IVC.      6.  Elevated creatinine, now normalized. Renal US unremarkable.     7.  Osseous metastatic disease:  Will get bone scan now and discuss starting xgeva monthly. She was previously scheduled for bone scan, but did not tolerate due to claustrophobia.  She has valium and is scheduled for 05/22/18.     8. Pleural effusions: Pleurx placed 04/06/18. She is draining ~783ml q3-4 days. 750mg  drained in clinic today.     10.  Patient requires a manual wheelchair to complete mobility-related activities of daily living, (MRADL's),  in home such as toileting, dressing, grooming and bathing.  Patient is able to safely propel themselves in home environment. The home environment provides adequate access to safely maneuver wheelchair through the home.  Pt is unable to complete ADL???s in the home with a  cane or walker due to her hypoxia with increased movement. Pt has adequate room to propel wheelchair and will frequently use the wheelchair to significantly improve participate in ADLs

## 2018-05-11 NOTE — Progress Notes
Pt checked into [facility] treatment for weekly taxol. When pt arrived to the unit she was SBO, [id number]. After sitting for a bit pt oxygen up to 93-91. Pt reports only being on oxygen at night. Pt explains to thus nurse that she has became increasingly SBO of breath of the past four days. Pt also has reported being increasingly weak, and had fell a few days ago. Pt requesting at this time that she was wondering if MD would be able to get her a wheelchair pt reorts, I can barely stand. Upon assessment nurse has noted pitting edema, per pt reports that feels like, my swelling has gotten worst. Asher Muir, Meredith Staggers, APRN-NP notified she ordered a CTA and requested to have patient to be seen in clinic today 05/11/2018 after CTA. Pt notified and agrees that this is the best option. Staff took patient in wheelchair downstairs to get scan done.

## 2018-05-12 ENCOUNTER — Encounter: Admit: 2018-05-12 | Discharge: 2018-05-12 | Payer: Commercial Managed Care - HMO

## 2018-05-12 NOTE — Progress Notes
Plan: DME    Intervention: SW notified by APRN, Richmond Campbell, of pt needing a pulse ox machine, wc, home O2, and shower chair. SW informed team via email of the documentation needed in a clinic note for the pulse ox/wc.     SW spoke to [employer] [phone] / fax [phone]; pt is setup for home O2. Pt has a concentrator and at least 2 e tanks. Per [employer] pt uses 2L at night.     Pulse ox order placed/signed. SW to await signed clinic note to fax referral to [employer].     Olander Friedl, LMSW

## 2018-05-15 ENCOUNTER — Encounter: Admit: 2018-05-15 | Discharge: 2018-05-15 | Payer: Commercial Managed Care - HMO

## 2018-05-15 DIAGNOSIS — C7931 Secondary malignant neoplasm of brain: Principal | ICD-10-CM

## 2018-05-15 DIAGNOSIS — C50919 Malignant neoplasm of unspecified site of unspecified female breast: ICD-10-CM

## 2018-05-15 MED ORDER — HEPARIN, PORCINE (PF) 100 UNIT/ML IV SYRG
500 [IU] | Freq: Once | 0 refills | Status: CP
Start: 2018-05-15 — End: ?

## 2018-05-15 MED ORDER — GADOBENATE DIMEGLUMINE 529 MG/ML (0.1MMOL/0.2ML) IV SOLN
14 mL | Freq: Once | INTRAVENOUS | 0 refills | Status: CP
Start: 2018-05-15 — End: ?
  Administered 2018-05-15: 16:00:00 14 mL via INTRAVENOUS

## 2018-05-15 MED ORDER — MISCELLANEOUS MEDICAL SUPPLY MISC MISC
0 refills | 1.00000 days | Status: DC
Start: 2018-05-15 — End: 2018-05-22

## 2018-05-15 NOTE — Progress Notes
Plan: DME    Intervention: SW spoke to [employer] [phone]; they received the signed clinic note dated 4/16 but no wc order. Length of need also needs to be put on the order for wc and pulse ox machine.     SW obtained signed orders from team. SW Earlene Plater faxed DME orders to [employer] at [phone]. Fax confirmation received.     SW to continue to follow.   Haylen Shelnutt, LMSW

## 2018-05-16 ENCOUNTER — Encounter: Admit: 2018-05-16 | Discharge: 2018-05-16 | Payer: Commercial Managed Care - HMO

## 2018-05-16 NOTE — Progress Notes
Plan: DME    Intervention: SW spoke to [employer] to follow up on pt's pulse ox machine and wc. Pt's wc to be delivered today and they have spoken to pt. Pulse ox machine is still being processed but will call this worker should additional info be needed.     Nijee Heatwole, LMSW

## 2018-05-17 ENCOUNTER — Encounter: Admit: 2018-05-17 | Discharge: 2018-05-17 | Payer: Commercial Managed Care - HMO

## 2018-05-17 NOTE — Progress Notes
Discontinued from Oral Oncology Patient Management Program    Donna Tyler has been removed from the Oral Oncology Patient Management Program due to progression.  Patient has discontinued taking everolimus.      The patient's provider, Dr. Tiburcio Pea, has noted the above information in their note.        The patient may be re-enrolled at any time by contacting [phone].      Marrion Coy, [facility]  Clinical Pharmacist  05/17/2018

## 2018-05-18 ENCOUNTER — Encounter: Admit: 2018-05-18 | Discharge: 2018-05-18 | Payer: Commercial Managed Care - HMO

## 2018-05-18 ENCOUNTER — Emergency Department: Admit: 2018-05-18 | Discharge: 2018-05-18 | Payer: Commercial Managed Care - HMO

## 2018-05-18 ENCOUNTER — Encounter: Admit: 2018-05-18 | Discharge: 2018-05-19 | Payer: Commercial Managed Care - HMO

## 2018-05-18 DIAGNOSIS — Z789 Other specified health status: ICD-10-CM

## 2018-05-18 DIAGNOSIS — G8929 Other chronic pain: ICD-10-CM

## 2018-05-18 DIAGNOSIS — C786 Secondary malignant neoplasm of retroperitoneum and peritoneum: Principal | ICD-10-CM

## 2018-05-18 DIAGNOSIS — C799 Secondary malignant neoplasm of unspecified site: ICD-10-CM

## 2018-05-18 DIAGNOSIS — K652 Spontaneous bacterial peritonitis: ICD-10-CM

## 2018-05-18 DIAGNOSIS — C50919 Malignant neoplasm of unspecified site of unspecified female breast: Principal | ICD-10-CM

## 2018-05-18 DIAGNOSIS — K5900 Constipation, unspecified: ICD-10-CM

## 2018-05-18 DIAGNOSIS — F329 Major depressive disorder, single episode, unspecified: ICD-10-CM

## 2018-05-18 DIAGNOSIS — Z87311 Personal history of (healed) other pathological fracture: ICD-10-CM

## 2018-05-18 LAB — COMPREHENSIVE METABOLIC PANEL
Lab: 139 MMOL/L — ABNORMAL LOW (ref 137–147)
Lab: 409 U/L — ABNORMAL HIGH (ref 25–110)
Lab: 62 U/L — ABNORMAL HIGH (ref 7–40)
Lab: 7 mg/dL (ref 7–25)
Lab: 8.4 mg/dL — ABNORMAL LOW (ref 8.5–10.6)

## 2018-05-18 LAB — POC GLUCOSE: Lab: 109 mg/dL — ABNORMAL HIGH (ref 70–100)

## 2018-05-18 LAB — CBC AND DIFF
Lab: 3.3 M/UL — ABNORMAL LOW (ref 4.0–5.0)
Lab: 6.6 10*3/uL (ref 4.5–11.0)

## 2018-05-18 LAB — LIPASE: Lab: 116 U/L — ABNORMAL HIGH (ref 11–82)

## 2018-05-18 LAB — AMMONIA: Lab: 46 umol/L — ABNORMAL HIGH (ref 9–35)

## 2018-05-18 LAB — POC LACTATE: Lab: 1 MMOL/L (ref 0.5–2.0)

## 2018-05-18 MED ORDER — LORAZEPAM 2 MG/ML IJ SOLN
1 mg | Freq: Once | INTRAVENOUS | 0 refills | Status: CP
Start: 2018-05-18 — End: ?
  Administered 2018-05-19: 02:00:00 1 mg via INTRAVENOUS

## 2018-05-18 MED ORDER — LIDOCAINE HCL 10 MG/ML (1 %) IJ SOLN
50 mL | Freq: Once | INTRAMUSCULAR | 0 refills | Status: CP
Start: 2018-05-18 — End: ?
  Administered 2018-05-18: 17:00:00 50 mL via INTRAMUSCULAR

## 2018-05-18 MED ORDER — PROMETHAZINE 25 MG/ML IJ SOLN
12.5 mg | INTRAVENOUS | 0 refills | Status: DC | PRN
Start: 2018-05-18 — End: 2018-05-22
  Administered 2018-05-18 – 2018-05-22 (×5): 12.5 mg via INTRAVENOUS

## 2018-05-18 MED ORDER — CALCIUM CARBONATE-VITAMIN D3 500 MG(1,250MG) -200 UNIT PO TAB
1 | Freq: Two times a day (BID) | ORAL | 0 refills | Status: DC
Start: 2018-05-18 — End: 2018-05-19
  Administered 2018-05-19: 13:00:00 1 via ORAL

## 2018-05-18 MED ORDER — METOCLOPRAMIDE HCL 5 MG/ML IJ SOLN
10 mg | Freq: Once | INTRAVENOUS | 0 refills | Status: CP
Start: 2018-05-18 — End: ?
  Administered 2018-05-18: 19:00:00 10 mg via INTRAVENOUS

## 2018-05-18 MED ORDER — POLYETHYLENE GLYCOL 3350 17 GRAM PO PWPK
1 | Freq: Two times a day (BID) | ORAL | 0 refills | Status: DC | PRN
Start: 2018-05-18 — End: 2018-05-22

## 2018-05-18 MED ORDER — ALBUMIN, HUMAN 25 % IV SOLP
25 g | Freq: Once | INTRAVENOUS | 0 refills | Status: CP
Start: 2018-05-18 — End: ?
  Administered 2018-05-18: 17:00:00 25 g via INTRAVENOUS

## 2018-05-18 MED ORDER — SENNOSIDES-DOCUSATE SODIUM 8.6-50 MG PO TAB
1 | Freq: Two times a day (BID) | ORAL | 0 refills | Status: DC
Start: 2018-05-18 — End: 2018-05-22
  Administered 2018-05-19 – 2018-05-22 (×3): 1 via ORAL

## 2018-05-18 MED ORDER — LACTATED RINGERS IV SOLP
1000 mL | INTRAVENOUS | 0 refills | Status: CP
Start: 2018-05-18 — End: ?
  Administered 2018-05-18: 22:00:00 1000 mL via INTRAVENOUS

## 2018-05-18 MED ORDER — CEFTRIAXONE INJ 2GM IVP
2 g | INTRAVENOUS | 0 refills | Status: DC
Start: 2018-05-18 — End: 2018-05-22
  Administered 2018-05-19 – 2018-05-22 (×4): 2 g via INTRAVENOUS

## 2018-05-18 MED ORDER — MORPHINE 15 MG PO TBER
60 mg | Freq: Two times a day (BID) | ORAL | 0 refills | Status: DC
Start: 2018-05-18 — End: 2018-05-22
  Administered 2018-05-19: 13:00:00 60 mg via ORAL
  Administered 2018-05-19: 02:00:00 30 mg via ORAL
  Administered 2018-05-20 – 2018-05-22 (×6): 60 mg via ORAL

## 2018-05-18 MED ORDER — IOHEXOL 350 MG IODINE/ML IV SOLN
80 mL | Freq: Once | INTRAVENOUS | 0 refills | Status: CP
Start: 2018-05-18 — End: ?
  Administered 2018-05-18: 20:00:00 80 mL via INTRAVENOUS

## 2018-05-18 MED ORDER — LACTATED RINGERS IV SOLP
INTRAVENOUS | 0 refills | Status: AC
Start: 2018-05-18 — End: ?
  Administered 2018-05-19 (×2): 1000.000 mL via INTRAVENOUS

## 2018-05-18 MED ORDER — SERTRALINE 100 MG PO TAB
100 mg | Freq: Two times a day (BID) | ORAL | 0 refills | Status: DC
Start: 2018-05-18 — End: 2018-05-22
  Administered 2018-05-19 – 2018-05-22 (×7): 100 mg via ORAL

## 2018-05-18 MED ORDER — CEFTRIAXONE INJ 2GM IVP
2 g | Freq: Once | INTRAVENOUS | 0 refills | Status: CP
Start: 2018-05-18 — End: ?
  Administered 2018-05-18: 17:00:00 2 g via INTRAVENOUS

## 2018-05-18 MED ORDER — PROCHLORPERAZINE EDISYLATE 5 MG/ML IJ SOLN
10 mg | Freq: Once | INTRAVENOUS | 0 refills | Status: CP
Start: 2018-05-18 — End: ?
  Administered 2018-05-18: 19:00:00 10 mg via INTRAVENOUS

## 2018-05-18 MED ORDER — DIPHENHYDRAMINE HCL 25 MG PO CAP
50 mg | Freq: Every evening | ORAL | 0 refills | Status: DC
Start: 2018-05-18 — End: 2018-05-22
  Administered 2018-05-20 – 2018-05-22 (×3): 50 mg via ORAL

## 2018-05-18 MED ORDER — RIVAROXABAN 20 MG PO TAB
20 mg | Freq: Every day | ORAL | 0 refills | Status: DC
Start: 2018-05-18 — End: 2018-05-22
  Administered 2018-05-19 – 2018-05-22 (×4): 20 mg via ORAL

## 2018-05-18 MED ORDER — MORPHINE 2 MG/ML IV SYRG
2 mg | Freq: Once | INTRAVENOUS | 0 refills | Status: CP
Start: 2018-05-18 — End: ?
  Administered 2018-05-19: 02:00:00 2 mg via INTRAVENOUS

## 2018-05-18 MED ORDER — PANTOPRAZOLE 40 MG PO TBEC
40 mg | Freq: Every day | ORAL | 0 refills | Status: DC | PRN
Start: 2018-05-18 — End: 2018-05-22

## 2018-05-18 MED ORDER — POTASSIUM CHLORIDE IN WATER 10 MEQ/50 ML IV PGBK
10 meq | INTRAVENOUS | 0 refills | Status: CP
Start: 2018-05-18 — End: ?
  Administered 2018-05-18 – 2018-05-19 (×6): 10 meq via INTRAVENOUS

## 2018-05-18 MED ORDER — SODIUM CHLORIDE 0.9 % IJ SOLN
50 mL | Freq: Once | INTRAVENOUS | 0 refills | Status: CP
Start: 2018-05-18 — End: ?
  Administered 2018-05-18: 20:00:00 50 mL via INTRAVENOUS

## 2018-05-18 MED ORDER — ONDANSETRON HCL (PF) 4 MG/2 ML IJ SOLN
4 mg | Freq: Once | INTRAVENOUS | 0 refills | Status: CP
Start: 2018-05-18 — End: ?
  Administered 2018-05-18: 16:00:00 4 mg via INTRAVENOUS

## 2018-05-18 MED ORDER — MORPHINE 15 MG PO TAB
15-30 mg | ORAL | 0 refills | Status: DC | PRN
Start: 2018-05-18 — End: 2018-05-22
  Administered 2018-05-21 – 2018-05-22 (×3): 15 mg via ORAL

## 2018-05-18 MED ORDER — DIPHENHYDRAMINE HCL 12.5 MG/5 ML PO LIQD
50 mg | Freq: Every evening | ORAL | 0 refills | Status: DC
Start: 2018-05-18 — End: 2018-05-18

## 2018-05-18 NOTE — H&P (View-Only)
Disposition: Admit to oncology.    Patient seen and discussed with Dr. Arbutus Leas, MD  PGY1 Internal Medicine  Pager [phone]      __________________________________________________________________________________  Primary Care Physician: Hyman Bible  Verified    Chief Complaint: Fatigue, nausea/vomiting    History of Present Illness:     Patient presents to the hospital after having falls and waxing/waning mental status at home.  Patient reports 2 to 3 days of nausea/vomiting.  No diarrhea or severe constipation.  Has chronic shortness of breath with Pleurx drain in place, has been requiring fluid removal frequently.  Has failed multiple lines of chemotherapy for her breast cancer.  Has had multiple admissions for weakness/nausea/vomiting.  Patient appears very ill.  Reports minimal abdominal pain but denies any fevers or chills at home.  Patient has poor support at home, lives alone and distant family members that live out of state.  Has history of SBP with MSSA.  Patient has extreme fatigue at home has been bedbound for the past multiple days.      Past Medical History     Medical History:   Diagnosis Date   ??? Chronic pain    ??? Constipation    ??? Depression    ??? Gravida 2 para 2    ??? History of pathologic fracture    ??? Metastatic breast cancer (HCC)    ??? SBP (spontaneous bacterial peritonitis) (HCC)     MSSA, August 2018       Past Surgical History     Surgical History:   Procedure Laterality Date   ??? BRONCHOSCOPY DIAGNOSTIC WITH CELL WASHING - FLEXIBLE N/A 03/14/2018    Performed by Lorna Few, MD at [facility] OR   ??? BRONCHOSCOPY WITH TRANSBRONCHIAL NEEDLE ASPIRATION AND BIOPSY TRACHEA/ MAIN STEM/ LOBAR BRONCHUS - FLEXIBLE N/A 03/14/2018    Performed by Lorna Few, MD at [facility] OR   ??? BRONCHOSCOPY WITH ENDOBRONCHIAL ULTRASOUND GUIDED TRANSTRACHEAL/ TRANSBRONCHIAL SAMPLING - 3 OR MORE MEDIASTINAL/ HILAR LYMPH NODE STATIONS/ STRUCTURE - FLEXIBLE N/A 03/14/2018    Performed by Lorna Few, MD at [facility] OR Constitutional:   Fever, chills, unintentional weight loss   Eyes:   Diplopia, blurry vision   ENT:   Congestion   Respiratory:   Chronic shortness of breath  cough    Cardiovascular:   Chest pain, palpitations, claudication    Gastrointestinal:   Nausea/vomiting, mild abdominal pain  hematemesis, melena   Genitourinary:   Dysuria    Hematologic:   Bleeding dyscrasias, easy bruising    Musculoskeletal:   Arthralgias, myalgias    Neurological:   Intermittent confusion Headache, numbness, tingling, loss of consciousness, seizures, tremors   Behavioral/Psych:   Suicidal ideation, hallucinations, delusions, mood swings   Skin:  Rash, itching   Immunologic:  Joint swelling   Endocrine:  No polyphagia, polydipsia       Physical Exam   Vital signs:  BP: 138/89 (04/23 1530)  Temp: 36.8 ???C (98.2 ???F) (04/23 1005)  Pulse: 106 (04/23 1530)  Respirations: 22 PER MINUTE (04/23 1527)  SpO2: 96 % (04/23 1530)  SpO2 Pulse: 106 (04/23 1530)  BP Readings from Last 6 Encounters:   05/18/18 138/89   05/11/18 120/73   05/11/18 116/69   05/04/18 95/62   04/27/18 104/67   04/25/18 95/63     Wt Readings from Last 3 Encounters:   05/11/18 71.2 kg (157 lb)   05/04/18 71.2 kg (157 lb)   04/27/18 70.9 kg (156 lb  3.2 oz)       General: 53 y.o. female, cooperative, ill-appearing, cachectic  Head: atraumatic, alopecia  Eyes: Conjunctivae/corneas clear. PERRL, EOMs intact.  ENT: No gross deformities. Mucous membranes moist. Pharynx non-erythematous  Neck: Supple, symmetrical, trachea midline, no adenopathy, thyroid not enlarged, no carotid bruit and no JVD  Back: Symmetric, no curvature, ROM normal.  No CVA tenderness.  Lungs: Clear to auscultation on the left, decreased breath sounds/coarse in the right lower base  Chest wall: No tenderness or deformity, chest tube in right posterior  Heart: Tachycardia with regular rhythm, no murmur  Abdomen: Soft, mildly tender, slightly distended. Bowel sounds slightly decreased. No masses. No organomegaly.

## 2018-05-18 NOTE — ED Notes
Pt denies any active vomiting since medication administration and wheeled to CT scanner. Denies any additional needs at this time and given multiple warm blankets.

## 2018-05-18 NOTE — ED Notes
Pt taken off of contact precautions due to negative COVID-19 swab results.

## 2018-05-19 ENCOUNTER — Encounter: Admit: 2018-05-19 | Discharge: 2018-05-19 | Payer: Commercial Managed Care - HMO

## 2018-05-19 LAB — URINALYSIS DIPSTICK

## 2018-05-19 LAB — URINALYSIS, MICROSCOPIC

## 2018-05-19 NOTE — Consults
-   HOB less than or equal to 30 degrees, unless contraindicated, to prevent shearing at coccyx/sacrum.    Wound Team will sign off. Please re-consult if skin breakdown worsens.     Bettey Mare RN, BSN  Wound/ Ostomy Nursing Consult Service  Office: [phone]  Pager: [phone]  Wound/ Ostomy Team pager (after hours/ weekends): [phone]

## 2018-05-19 NOTE — Progress Notes
OCCUPATIONAL THERAPY  ASSESSMENT NOTE      Name: Donna Tyler        MRN: [id number]          DOB: 08/25/1965          Age: 53 y.o.  Admission Date: 05/18/2018             LOS: 1 day    Mobility  Patient Turn/Position: Self  Progressive Mobility Level: Active transfer to chair  Level of Assistance: Assist X1  Assistive Device: Hand Held  Time Tolerated: 11-30 minutes  Activity Limited By: Weakness;Fatigue    Subjective  Pertinent Dx per Physician: Patient is a 53 yo female with PMH of metasatic breast cancer, ascites requiring paracentesis, chronic pain presenting with falls, vomiting, and speech changes. The patient reports slurred speech and falls over the past 3 days. She states vomiting has progressed over the past day with non-bloody and non-billious vomit. She endorses paracentesis recently and denies abdominal pain. She does endorse progressive distension. She endorses reduced sensation of the lower extremities bilaterally. She endorses compliance to current chemotherapy. She denies fever, cough, chest pain, shortness of breath.  Patient Stated Goals: I will get into that chair.  Precautions: Standard;Falls  Pain / Complaints: Patient demonstrates nonverbal signs of pain;Patient agrees to participate in therapy  Pain Location: Back  Pain Level Current: 5  Comments: Pt appropriate for therapy per bedside RN and presents in supine, hot pack behind back for pain. End of session, pt seated in chair, all needs in reach, hot pack repositioned and tabs alarm active, dietician present.    Objective  Psychosocial Status: Participates in Therapy with Encouragement  Persons Present: (dietician at end of session)    Home Living  Type of Home: Apartment  Home Layout: One Level;Stairs to Enter w/ Rails(x10)  Financial risk analyst / Tub: Tub/Shower Unit;Walk-in Press photographer: Biomedical scientist: Accessible via Landscape architect: Dan Humphreys;Shower/Tub Brewing technologist

## 2018-05-19 NOTE — Case Management (ED)
Case Management Progress Note    NAME:Donna Tyler                          MRN: [id number]              DOB:Mar 23, 1965          AGE: 53 y.o.  ADMISSION DATE: 05/18/2018             DAYS ADMITTED: LOS: 1 day      Today???s Date: 05/19/2018    Plan  Discharge plan ongoing     Interventions  ? Support      ? Info or Referral      ? Discharge Planning     SW updated by team that pt no CMO. SW will need to call pt's ex husband Raiford Noble who is assisting in discharge planning [phone].    SW called Raiford Noble and discussed all discharge options. Home hospice vs LTC with Hospice vs hospice house. SW explained that at this time pt does not qualify for hospice house. Rick expressed understanding. Raiford Noble states is is leaning towards home with hospice then transition to hospice house if needed. Raiford Noble will discuss with family and update SW tomorrow    SW updated team    SW will continue to follow and assist with discharge planning   ? Medication Needs      ?  Financial      ? Legal      ? Other        Disposition  ? Expected Discharge Date    Expected Discharge Date: 05/21/18  ? Transportation   Does the patient need discharge transport arranged?: No  Transportation Name, Phone and Availability #1: pt states son will pick transport home  Does the patient use Medicaid Transportation?: No  ? Next Level of Care (Acute Psych discharges only)      ? Discharge Disposition                                          Durable Medical Equipment      No service has been selected for the patient.      [facility]      No service has been selected for the patient.      [facility]      No service has been selected for the patient.       Dialysis/Infusion      No service has been selected for the patient.      Marlene Lard, LMSW  [phone]

## 2018-05-22 ENCOUNTER — Ambulatory Visit: Admit: 2018-05-15 | Discharge: 2018-05-16 | Payer: Commercial Managed Care - HMO

## 2018-05-22 ENCOUNTER — Inpatient Hospital Stay
Admit: 2018-05-18 | Discharge: 2018-05-22 | Disposition: A | Discharge status: Hospice | Payer: Commercial Managed Care - HMO | Admitting: Medical Oncology

## 2018-05-22 DIAGNOSIS — Z66 Do not resuscitate: ICD-10-CM

## 2018-05-22 DIAGNOSIS — Z6825 Body mass index (BMI) 25.0-25.9, adult: ICD-10-CM

## 2018-05-22 DIAGNOSIS — G893 Neoplasm related pain (acute) (chronic): ICD-10-CM

## 2018-05-22 DIAGNOSIS — R5381 Other malaise: ICD-10-CM

## 2018-05-22 DIAGNOSIS — R188 Other ascites: ICD-10-CM

## 2018-05-22 DIAGNOSIS — A419 Sepsis, unspecified organism: ICD-10-CM

## 2018-05-22 DIAGNOSIS — Z1159 Encounter for screening for other viral diseases: ICD-10-CM

## 2018-05-22 DIAGNOSIS — R5383 Other fatigue: ICD-10-CM

## 2018-05-22 DIAGNOSIS — E43 Unspecified severe protein-calorie malnutrition: ICD-10-CM

## 2018-05-22 DIAGNOSIS — K652 Spontaneous bacterial peritonitis: ICD-10-CM

## 2018-05-22 DIAGNOSIS — F329 Major depressive disorder, single episode, unspecified: ICD-10-CM

## 2018-05-22 DIAGNOSIS — R296 Repeated falls: ICD-10-CM

## 2018-05-22 DIAGNOSIS — Z515 Encounter for palliative care: ICD-10-CM

## 2018-05-22 DIAGNOSIS — C786 Secondary malignant neoplasm of retroperitoneum and peritoneum: Principal | ICD-10-CM

## 2018-05-22 DIAGNOSIS — J90 Pleural effusion, not elsewhere classified: ICD-10-CM

## 2018-05-22 DIAGNOSIS — R627 Adult failure to thrive: ICD-10-CM

## 2018-05-22 DIAGNOSIS — R64 Cachexia: ICD-10-CM

## 2018-05-22 DIAGNOSIS — E559 Vitamin D deficiency, unspecified: ICD-10-CM

## 2018-05-22 DIAGNOSIS — E876 Hypokalemia: ICD-10-CM

## 2018-05-22 DIAGNOSIS — C7951 Secondary malignant neoplasm of bone: ICD-10-CM

## 2018-05-22 DIAGNOSIS — Z86711 Personal history of pulmonary embolism: ICD-10-CM

## 2018-05-22 DIAGNOSIS — J910 Malignant pleural effusion: ICD-10-CM

## 2018-05-22 DIAGNOSIS — Z87891 Personal history of nicotine dependence: ICD-10-CM

## 2018-05-22 MED ORDER — MORPHINE 60 MG PO TBER
60 mg | ORAL_TABLET | Freq: Two times a day (BID) | ORAL | 0 refills | 7.00000 days | Status: AC
Start: 2018-05-22 — End: ?

## 2018-05-22 MED ORDER — MORPHINE 15 MG PO TAB
ORAL_TABLET | ORAL | 0 refills | 7.00000 days | Status: AC | PRN
Start: 2018-05-22 — End: ?

## 2018-05-22 MED ORDER — LORAZEPAM 0.5 MG PO TAB
1 | ORAL_TABLET | ORAL | 0 refills | 12.00000 days | Status: AC | PRN
Start: 2018-05-22 — End: ?

## 2018-05-22 MED ORDER — CIPROFLOXACIN HCL 500 MG PO TAB
500 mg | ORAL_TABLET | Freq: Two times a day (BID) | ORAL | 0 refills | 10.00000 days | Status: AC
Start: 2018-05-22 — End: ?

## 2018-05-22 MED ORDER — PROMETHAZINE 25 MG/ML IJ SOLN
12.5 mg | INTRAVENOUS | 0 refills | 7.00000 days | Status: AC | PRN
Start: 2018-05-22 — End: ?

## 2018-05-22 NOTE — Discharge Instructions - Pharmacy
plan of care, options, goals of care, and elected to transition to comfort measures only and discharge on hospice. Her PTA medications of Xarelto for PE was continued during admission, and her PTA pain regimen of MS contin 60mg  BID, MS Contin IR 14-30 Q4H PRN. Patient to discharge to hospice house at time of discharge.  Patients daughter, Donna Tyler and ex-husband Raiford Noble were updated regarding overall condition and plan moving forward. DNAR-CMO at time of discharge to in-patient hospice facility.       Condition at Discharge: Terminal, comfort measures    Discharge Diagnoses:      Hospital Problems        Active Problems    * (Principal) Malignant neoplasm of female breast (HCC)    Chronic pain    Metastatic breast cancer (HCC)    Depression    Osseous metastasis (HCC)    Other fatigue    Vitamin D deficiency    Intractable vomiting with nausea    Metastasis from breast cancer [facility])    Debilitated patient    Cancer related pain    Malignant pleural effusion    Peritoneal metastases (HCC)    Frequent falls    History of pulmonary embolism    Severe malnutrition (HCC)          Surgical Procedures: None    Significant Diagnostic Studies and Procedures: noted in brief hospital course    Consults:  None    Patient Disposition: Long Term Care Facility   Hospice facility [facility] hospice    Patient instructions/medications:       Regular Diet    You have no dietary restriction. Please continue with a healthy balanced diet.     Report These Signs and Symptoms    Please contact your doctor if you have any of the following symptoms: uncontrolled pain, persistent nausea and/or vomiting or difficulty breathing     Questions About Your Stay    For questions or concerns regarding your hospital stay, call [phone].     Discharging attending physician: Carron Brazen [[id number]]      Activity as Tolerated    It is important to keep increasing your activity level after you leave

## 2018-05-24 LAB — CULTURE-BLOOD W/SENSITIVITY

## 2018-06-12 ENCOUNTER — Encounter: Admit: 2018-06-12 | Discharge: 2018-06-12 | Payer: Commercial Managed Care - HMO

## 2018-06-20 ENCOUNTER — Encounter: Admit: 2018-06-20 | Discharge: 2018-06-20 | Payer: Commercial Managed Care - HMO

## 2018-06-26 DEATH — deceased

## 2018-07-21 ENCOUNTER — Encounter: Admit: 2018-07-21 | Discharge: 2018-07-21

## 2019-03-21 ENCOUNTER — Encounter: Admit: 2019-03-21 | Discharge: 2019-03-21 | Payer: Commercial Managed Care - HMO

## 2021-03-11 ENCOUNTER — Encounter: Admit: 2021-03-11 | Discharge: 2021-03-11 | Payer: Commercial Managed Care - HMO

## 2021-04-26 ENCOUNTER — Encounter: Payer: Self-pay | Admitting: Hematology and Oncology

## 2021-06-27 ENCOUNTER — Other Ambulatory Visit: Payer: Self-pay | Admitting: Nurse Practitioner

## 2022-05-21 ENCOUNTER — Encounter: Admit: 2022-05-21 | Discharge: 2022-05-21 | Payer: Commercial Managed Care - HMO
